# Patient Record
Sex: Female | Born: 1935 | Race: Black or African American | Hispanic: No | Marital: Single | Smoking: Former smoker
Health system: Southern US, Community
[De-identification: ages and names within clinical notes are randomized; demographics above are authoritative.]

## PROBLEM LIST (undated history)

## (undated) DIAGNOSIS — E042 Nontoxic multinodular goiter: Secondary | ICD-10-CM

## (undated) DIAGNOSIS — A159 Respiratory tuberculosis unspecified: Secondary | ICD-10-CM

## (undated) DIAGNOSIS — I10 Essential (primary) hypertension: Secondary | ICD-10-CM

## (undated) DIAGNOSIS — I509 Heart failure, unspecified: Secondary | ICD-10-CM

## (undated) DIAGNOSIS — E785 Hyperlipidemia, unspecified: Secondary | ICD-10-CM

## (undated) DIAGNOSIS — I4891 Unspecified atrial fibrillation: Secondary | ICD-10-CM

## (undated) DIAGNOSIS — R9389 Abnormal findings on diagnostic imaging of other specified body structures: Secondary | ICD-10-CM

## (undated) DIAGNOSIS — I311 Chronic constrictive pericarditis: Secondary | ICD-10-CM

## (undated) DIAGNOSIS — Z90710 Acquired absence of both cervix and uterus: Secondary | ICD-10-CM

## (undated) DIAGNOSIS — E039 Hypothyroidism, unspecified: Secondary | ICD-10-CM

## (undated) DIAGNOSIS — R498 Other voice and resonance disorders: Secondary | ICD-10-CM

## (undated) DIAGNOSIS — R011 Cardiac murmur, unspecified: Secondary | ICD-10-CM

## (undated) HISTORY — DX: Unspecified atrial fibrillation: I48.91

## (undated) HISTORY — PX: OTHER SURGICAL HISTORY: SHX169

## (undated) HISTORY — DX: Respiratory tuberculosis unspecified: A15.9

## (undated) HISTORY — DX: Heart failure, unspecified: I50.9

## (undated) HISTORY — DX: Cardiac murmur, unspecified: R01.1

## (undated) HISTORY — DX: Chronic constrictive pericarditis: I31.1

## (undated) HISTORY — DX: Abnormal findings on diagnostic imaging of other specified body structures: R93.89

## (undated) HISTORY — DX: Hypothyroidism, unspecified: E03.9

## (undated) HISTORY — DX: Essential (primary) hypertension: I10

## (undated) HISTORY — DX: Other voice and resonance disorders: R49.8

## (undated) HISTORY — DX: Nontoxic multinodular goiter: E04.2

## (undated) HISTORY — DX: Acquired absence of both cervix and uterus: Z90.710

## (undated) HISTORY — DX: Hyperlipidemia, unspecified: E78.5

---

## 1990-10-21 DIAGNOSIS — Z90710 Acquired absence of both cervix and uterus: Secondary | ICD-10-CM

## 1990-10-21 HISTORY — PX: ABDOMINAL HYSTERECTOMY: SHX81

## 1990-10-21 HISTORY — DX: Acquired absence of both cervix and uterus: Z90.710

## 2000-10-21 DIAGNOSIS — A159 Respiratory tuberculosis unspecified: Secondary | ICD-10-CM

## 2000-10-21 HISTORY — PX: PERICARDIAL WINDOW: SHX2213

## 2000-10-21 HISTORY — DX: Respiratory tuberculosis unspecified: A15.9

## 2002-12-09 ENCOUNTER — Encounter: Admission: RE | Admit: 2002-12-09 | Discharge: 2002-12-09 | Payer: Self-pay | Admitting: Cardiology

## 2002-12-09 ENCOUNTER — Encounter: Payer: Self-pay | Admitting: Cardiology

## 2003-10-26 ENCOUNTER — Ambulatory Visit (HOSPITAL_COMMUNITY): Admission: RE | Admit: 2003-10-26 | Discharge: 2003-10-26 | Payer: Self-pay | Admitting: Cardiology

## 2006-04-01 ENCOUNTER — Ambulatory Visit: Payer: Self-pay | Admitting: Cardiology

## 2006-04-10 ENCOUNTER — Ambulatory Visit: Payer: Self-pay | Admitting: Cardiology

## 2006-04-15 ENCOUNTER — Ambulatory Visit: Payer: Self-pay | Admitting: Internal Medicine

## 2006-04-17 ENCOUNTER — Ambulatory Visit: Payer: Self-pay

## 2006-04-17 ENCOUNTER — Ambulatory Visit: Payer: Self-pay | Admitting: Internal Medicine

## 2006-04-17 ENCOUNTER — Encounter: Payer: Self-pay | Admitting: Cardiology

## 2006-04-22 ENCOUNTER — Ambulatory Visit: Payer: Self-pay | Admitting: Family Medicine

## 2006-04-25 ENCOUNTER — Ambulatory Visit: Payer: Self-pay | Admitting: Internal Medicine

## 2006-05-01 ENCOUNTER — Ambulatory Visit: Payer: Self-pay

## 2006-05-01 ENCOUNTER — Ambulatory Visit: Payer: Self-pay | Admitting: Cardiology

## 2006-05-12 ENCOUNTER — Ambulatory Visit: Payer: Self-pay | Admitting: Cardiology

## 2006-05-13 ENCOUNTER — Ambulatory Visit: Payer: Self-pay | Admitting: Internal Medicine

## 2006-05-27 ENCOUNTER — Ambulatory Visit: Payer: Self-pay | Admitting: Cardiology

## 2006-06-10 ENCOUNTER — Ambulatory Visit: Payer: Self-pay | Admitting: Cardiology

## 2006-06-10 ENCOUNTER — Ambulatory Visit: Payer: Self-pay | Admitting: Internal Medicine

## 2006-06-24 ENCOUNTER — Ambulatory Visit: Payer: Self-pay | Admitting: Endocrinology

## 2006-06-26 ENCOUNTER — Ambulatory Visit: Payer: Self-pay | Admitting: Cardiology

## 2006-07-14 ENCOUNTER — Ambulatory Visit: Payer: Self-pay | Admitting: Cardiology

## 2006-07-28 ENCOUNTER — Ambulatory Visit: Payer: Self-pay | Admitting: Cardiovascular Disease

## 2006-08-19 ENCOUNTER — Ambulatory Visit: Payer: Self-pay | Admitting: Cardiology

## 2006-08-29 ENCOUNTER — Ambulatory Visit: Payer: Self-pay | Admitting: Cardiology

## 2006-09-09 ENCOUNTER — Ambulatory Visit: Payer: Self-pay | Admitting: Cardiovascular Disease

## 2006-09-23 ENCOUNTER — Ambulatory Visit: Payer: Self-pay | Admitting: Internal Medicine

## 2006-09-23 ENCOUNTER — Ambulatory Visit: Payer: Self-pay | Admitting: Cardiology

## 2006-10-23 ENCOUNTER — Ambulatory Visit: Payer: Self-pay | Admitting: Cardiology

## 2006-10-28 ENCOUNTER — Ambulatory Visit: Payer: Self-pay | Admitting: Endocrinology

## 2006-11-11 ENCOUNTER — Ambulatory Visit: Payer: Self-pay | Admitting: *Deleted

## 2006-11-24 ENCOUNTER — Encounter: Admission: RE | Admit: 2006-11-24 | Discharge: 2006-11-24 | Payer: Self-pay | Admitting: Endocrinology

## 2006-12-09 ENCOUNTER — Ambulatory Visit: Payer: Self-pay | Admitting: Endocrinology

## 2006-12-11 ENCOUNTER — Ambulatory Visit: Payer: Self-pay | Admitting: Cardiology

## 2006-12-22 ENCOUNTER — Encounter: Admission: RE | Admit: 2006-12-22 | Discharge: 2006-12-22 | Payer: Self-pay | Admitting: Endocrinology

## 2007-01-08 ENCOUNTER — Ambulatory Visit: Payer: Self-pay | Admitting: Cardiology

## 2007-01-20 ENCOUNTER — Ambulatory Visit: Payer: Self-pay | Admitting: Endocrinology

## 2007-02-05 ENCOUNTER — Ambulatory Visit: Payer: Self-pay | Admitting: Internal Medicine

## 2007-02-19 ENCOUNTER — Ambulatory Visit: Payer: Self-pay | Admitting: Endocrinology

## 2007-02-19 LAB — CONVERTED CEMR LAB: Free T4: 0.8 ng/dL (ref 0.6–1.6)

## 2007-03-05 ENCOUNTER — Ambulatory Visit: Payer: Self-pay | Admitting: Internal Medicine

## 2007-03-19 ENCOUNTER — Ambulatory Visit: Payer: Self-pay | Admitting: *Deleted

## 2007-03-24 ENCOUNTER — Ambulatory Visit: Payer: Self-pay | Admitting: Cardiology

## 2007-03-26 ENCOUNTER — Ambulatory Visit: Payer: Self-pay | Admitting: Endocrinology

## 2007-03-26 LAB — CONVERTED CEMR LAB: TSH: 21.32 microintl units/mL — ABNORMAL HIGH (ref 0.35–5.50)

## 2007-03-31 ENCOUNTER — Ambulatory Visit: Payer: Self-pay | Admitting: Internal Medicine

## 2007-04-01 ENCOUNTER — Ambulatory Visit: Payer: Self-pay | Admitting: Internal Medicine

## 2007-04-02 ENCOUNTER — Ambulatory Visit: Payer: Self-pay | Admitting: Cardiology

## 2007-04-14 ENCOUNTER — Ambulatory Visit: Payer: Self-pay | Admitting: Cardiology

## 2007-04-28 ENCOUNTER — Ambulatory Visit: Payer: Self-pay | Admitting: Endocrinology

## 2007-05-05 ENCOUNTER — Ambulatory Visit: Payer: Self-pay | Admitting: Cardiovascular Disease

## 2007-06-02 ENCOUNTER — Ambulatory Visit: Payer: Self-pay | Admitting: Cardiology

## 2007-06-02 LAB — CONVERTED CEMR LAB: TSH: 1.61 microintl units/mL (ref 0.35–5.50)

## 2007-06-04 ENCOUNTER — Encounter: Payer: Self-pay | Admitting: Internal Medicine

## 2007-06-23 ENCOUNTER — Ambulatory Visit: Payer: Self-pay | Admitting: Cardiology

## 2007-06-30 ENCOUNTER — Ambulatory Visit: Payer: Self-pay | Admitting: Endocrinology

## 2007-07-14 ENCOUNTER — Ambulatory Visit: Payer: Self-pay | Admitting: Cardiology

## 2007-07-21 ENCOUNTER — Ambulatory Visit: Payer: Self-pay | Admitting: Internal Medicine

## 2007-07-21 ENCOUNTER — Ambulatory Visit: Payer: Self-pay | Admitting: Endocrinology

## 2007-07-21 ENCOUNTER — Ambulatory Visit: Payer: Self-pay | Admitting: Cardiology

## 2007-07-21 LAB — CONVERTED CEMR LAB
ALT: 20 units/L (ref 0–35)
AST: 21 units/L (ref 0–37)
Albumin: 3.4 g/dL — ABNORMAL LOW (ref 3.5–5.2)
Alkaline Phosphatase: 52 units/L (ref 39–117)
Cholesterol: 233 mg/dL (ref 0–200)
Direct LDL: 166.5 mg/dL
Total Bilirubin: 0.6 mg/dL (ref 0.3–1.2)
Total Protein: 7.2 g/dL (ref 6.0–8.3)
Triglycerides: 165 mg/dL — ABNORMAL HIGH (ref 0–149)

## 2007-07-23 ENCOUNTER — Ambulatory Visit: Payer: Self-pay | Admitting: Pulmonary Disease

## 2007-07-28 ENCOUNTER — Ambulatory Visit: Payer: Self-pay | Admitting: Pulmonary Disease

## 2007-07-28 ENCOUNTER — Ambulatory Visit (HOSPITAL_COMMUNITY): Admission: RE | Admit: 2007-07-28 | Discharge: 2007-07-28 | Payer: Self-pay | Admitting: Pulmonary Disease

## 2007-07-30 ENCOUNTER — Ambulatory Visit: Payer: Self-pay | Admitting: Pulmonary Disease

## 2007-08-06 ENCOUNTER — Telehealth: Payer: Self-pay | Admitting: Endocrinology

## 2007-08-18 ENCOUNTER — Ambulatory Visit: Payer: Self-pay | Admitting: Cardiology

## 2007-08-28 ENCOUNTER — Ambulatory Visit: Payer: Self-pay | Admitting: Cardiology

## 2007-09-01 ENCOUNTER — Ambulatory Visit: Payer: Self-pay | Admitting: Cardiovascular Disease

## 2007-09-15 ENCOUNTER — Ambulatory Visit: Payer: Self-pay | Admitting: Internal Medicine

## 2007-10-02 ENCOUNTER — Telehealth: Payer: Self-pay | Admitting: Pulmonary Disease

## 2007-10-06 ENCOUNTER — Ambulatory Visit: Payer: Self-pay | Admitting: Internal Medicine

## 2007-10-07 ENCOUNTER — Telehealth (INDEPENDENT_AMBULATORY_CARE_PROVIDER_SITE_OTHER): Payer: Self-pay | Admitting: *Deleted

## 2007-10-07 DIAGNOSIS — I10 Essential (primary) hypertension: Secondary | ICD-10-CM

## 2007-10-07 DIAGNOSIS — E042 Nontoxic multinodular goiter: Secondary | ICD-10-CM

## 2007-10-07 DIAGNOSIS — I4891 Unspecified atrial fibrillation: Secondary | ICD-10-CM

## 2007-10-07 DIAGNOSIS — I311 Chronic constrictive pericarditis: Secondary | ICD-10-CM | POA: Insufficient documentation

## 2007-10-07 DIAGNOSIS — R498 Other voice and resonance disorders: Secondary | ICD-10-CM

## 2007-11-03 ENCOUNTER — Ambulatory Visit: Payer: Self-pay | Admitting: Cardiovascular Disease

## 2007-11-17 ENCOUNTER — Ambulatory Visit: Payer: Self-pay | Admitting: Cardiovascular Disease

## 2007-11-19 ENCOUNTER — Ambulatory Visit: Payer: Self-pay | Admitting: Cardiology

## 2007-12-02 DIAGNOSIS — R0689 Other abnormalities of breathing: Secondary | ICD-10-CM

## 2007-12-08 ENCOUNTER — Ambulatory Visit: Payer: Self-pay | Admitting: Cardiology

## 2007-12-29 ENCOUNTER — Ambulatory Visit: Payer: Self-pay | Admitting: Endocrinology

## 2007-12-29 DIAGNOSIS — E7800 Pure hypercholesterolemia, unspecified: Secondary | ICD-10-CM | POA: Insufficient documentation

## 2008-01-05 ENCOUNTER — Encounter: Payer: Self-pay | Admitting: Endocrinology

## 2008-01-05 ENCOUNTER — Ambulatory Visit: Payer: Self-pay | Admitting: Cardiology

## 2008-01-06 LAB — CONVERTED CEMR LAB
Cholesterol: 216 mg/dL (ref 0–200)
Direct LDL: 152 mg/dL
Triglycerides: 129 mg/dL (ref 0–149)

## 2008-01-12 ENCOUNTER — Telehealth: Payer: Self-pay | Admitting: Endocrinology

## 2008-02-02 ENCOUNTER — Ambulatory Visit: Payer: Self-pay | Admitting: Cardiology

## 2008-03-08 ENCOUNTER — Ambulatory Visit: Payer: Self-pay | Admitting: Cardiology

## 2008-04-05 ENCOUNTER — Ambulatory Visit: Payer: Self-pay | Admitting: Cardiology

## 2008-05-03 ENCOUNTER — Ambulatory Visit: Payer: Self-pay | Admitting: Internal Medicine

## 2008-05-05 ENCOUNTER — Encounter: Payer: Self-pay | Admitting: Internal Medicine

## 2008-05-31 ENCOUNTER — Ambulatory Visit: Payer: Self-pay | Admitting: Cardiovascular Disease

## 2008-06-10 ENCOUNTER — Encounter: Payer: Self-pay | Admitting: Internal Medicine

## 2008-06-10 ENCOUNTER — Ambulatory Visit: Payer: Self-pay | Admitting: Internal Medicine

## 2008-06-18 ENCOUNTER — Encounter: Payer: Self-pay | Admitting: Internal Medicine

## 2008-06-18 DIAGNOSIS — M949 Disorder of cartilage, unspecified: Secondary | ICD-10-CM

## 2008-06-18 DIAGNOSIS — M899 Disorder of bone, unspecified: Secondary | ICD-10-CM | POA: Insufficient documentation

## 2008-06-21 ENCOUNTER — Ambulatory Visit: Payer: Self-pay | Admitting: Cardiology

## 2008-06-21 ENCOUNTER — Telehealth (INDEPENDENT_AMBULATORY_CARE_PROVIDER_SITE_OTHER): Payer: Self-pay | Admitting: *Deleted

## 2008-07-04 ENCOUNTER — Ambulatory Visit: Payer: Self-pay | Admitting: Endocrinology

## 2008-07-04 DIAGNOSIS — E890 Postprocedural hypothyroidism: Secondary | ICD-10-CM | POA: Insufficient documentation

## 2008-07-12 ENCOUNTER — Encounter: Admission: RE | Admit: 2008-07-12 | Discharge: 2008-07-12 | Payer: Self-pay | Admitting: Endocrinology

## 2008-07-15 ENCOUNTER — Ambulatory Visit: Payer: Self-pay | Admitting: Internal Medicine

## 2008-07-15 ENCOUNTER — Ambulatory Visit: Payer: Self-pay | Admitting: Endocrinology

## 2008-07-15 LAB — CONVERTED CEMR LAB
AST: 26 units/L (ref 0–37)
Albumin: 3.4 g/dL — ABNORMAL LOW (ref 3.5–5.2)
Total Bilirubin: 0.7 mg/dL (ref 0.3–1.2)
Total Protein: 7.3 g/dL (ref 6.0–8.3)

## 2008-07-27 ENCOUNTER — Telehealth (INDEPENDENT_AMBULATORY_CARE_PROVIDER_SITE_OTHER): Payer: Self-pay | Admitting: *Deleted

## 2008-07-28 ENCOUNTER — Telehealth (INDEPENDENT_AMBULATORY_CARE_PROVIDER_SITE_OTHER): Payer: Self-pay | Admitting: *Deleted

## 2008-08-09 ENCOUNTER — Telehealth: Payer: Self-pay | Admitting: Endocrinology

## 2008-08-09 ENCOUNTER — Ambulatory Visit: Payer: Self-pay | Admitting: Internal Medicine

## 2008-08-23 ENCOUNTER — Ambulatory Visit: Payer: Self-pay | Admitting: Endocrinology

## 2008-08-23 ENCOUNTER — Ambulatory Visit: Payer: Self-pay | Admitting: Internal Medicine

## 2008-08-23 LAB — CONVERTED CEMR LAB
Direct LDL: 143.5 mg/dL
Total CHOL/HDL Ratio: 6.8
VLDL: 21 mg/dL (ref 0–40)

## 2008-09-23 ENCOUNTER — Ambulatory Visit: Payer: Self-pay | Admitting: Internal Medicine

## 2008-10-20 ENCOUNTER — Ambulatory Visit: Payer: Self-pay | Admitting: Internal Medicine

## 2008-11-08 ENCOUNTER — Ambulatory Visit: Payer: Self-pay | Admitting: Cardiology

## 2008-11-17 ENCOUNTER — Ambulatory Visit: Payer: Self-pay | Admitting: Internal Medicine

## 2008-11-29 ENCOUNTER — Ambulatory Visit: Payer: Self-pay | Admitting: Cardiology

## 2008-12-07 ENCOUNTER — Ambulatory Visit: Payer: Self-pay | Admitting: Pulmonary Disease

## 2008-12-08 ENCOUNTER — Telehealth (INDEPENDENT_AMBULATORY_CARE_PROVIDER_SITE_OTHER): Payer: Self-pay | Admitting: *Deleted

## 2008-12-13 ENCOUNTER — Ambulatory Visit: Payer: Self-pay | Admitting: Cardiovascular Disease

## 2009-01-10 ENCOUNTER — Ambulatory Visit: Payer: Self-pay | Admitting: Cardiovascular Disease

## 2009-02-03 ENCOUNTER — Telehealth: Payer: Self-pay | Admitting: Endocrinology

## 2009-02-06 ENCOUNTER — Telehealth: Payer: Self-pay | Admitting: Internal Medicine

## 2009-02-07 ENCOUNTER — Encounter: Payer: Self-pay | Admitting: Internal Medicine

## 2009-02-08 ENCOUNTER — Encounter: Payer: Self-pay | Admitting: Internal Medicine

## 2009-02-10 ENCOUNTER — Ambulatory Visit: Payer: Self-pay | Admitting: Cardiology

## 2009-02-13 ENCOUNTER — Telehealth (INDEPENDENT_AMBULATORY_CARE_PROVIDER_SITE_OTHER): Payer: Self-pay | Admitting: Radiology

## 2009-02-14 ENCOUNTER — Ambulatory Visit: Payer: Self-pay

## 2009-02-14 ENCOUNTER — Encounter: Payer: Self-pay | Admitting: Internal Medicine

## 2009-02-14 ENCOUNTER — Ambulatory Visit: Payer: Self-pay | Admitting: Internal Medicine

## 2009-02-21 LAB — CONVERTED CEMR LAB
TSH: 4.18 microintl units/mL (ref 0.35–5.50)
Triglycerides: 176 mg/dL — ABNORMAL HIGH (ref 0.0–149.0)

## 2009-02-28 ENCOUNTER — Telehealth (INDEPENDENT_AMBULATORY_CARE_PROVIDER_SITE_OTHER): Payer: Self-pay | Admitting: *Deleted

## 2009-03-07 ENCOUNTER — Ambulatory Visit: Payer: Self-pay | Admitting: Internal Medicine

## 2009-03-13 ENCOUNTER — Encounter: Payer: Self-pay | Admitting: Internal Medicine

## 2009-03-21 ENCOUNTER — Ambulatory Visit: Payer: Self-pay | Admitting: Cardiology

## 2009-03-21 ENCOUNTER — Encounter: Payer: Self-pay | Admitting: *Deleted

## 2009-04-11 ENCOUNTER — Ambulatory Visit: Payer: Self-pay | Admitting: Cardiology

## 2009-04-20 ENCOUNTER — Telehealth: Payer: Self-pay | Admitting: Internal Medicine

## 2009-04-25 ENCOUNTER — Ambulatory Visit: Payer: Self-pay | Admitting: Cardiology

## 2009-04-25 ENCOUNTER — Encounter: Payer: Self-pay | Admitting: Cardiology

## 2009-04-25 ENCOUNTER — Encounter: Payer: Self-pay | Admitting: Internal Medicine

## 2009-04-25 LAB — CONVERTED CEMR LAB
POC INR: 1.6
Prothrombin Time: 15.4 s

## 2009-04-26 ENCOUNTER — Encounter: Payer: Self-pay | Admitting: *Deleted

## 2009-05-05 ENCOUNTER — Telehealth: Payer: Self-pay | Admitting: Internal Medicine

## 2009-05-09 ENCOUNTER — Ambulatory Visit: Payer: Self-pay | Admitting: Internal Medicine

## 2009-05-09 ENCOUNTER — Ambulatory Visit: Payer: Self-pay | Admitting: Cardiology

## 2009-05-09 DIAGNOSIS — H409 Unspecified glaucoma: Secondary | ICD-10-CM | POA: Insufficient documentation

## 2009-05-09 DIAGNOSIS — R609 Edema, unspecified: Secondary | ICD-10-CM

## 2009-05-09 DIAGNOSIS — E785 Hyperlipidemia, unspecified: Secondary | ICD-10-CM | POA: Insufficient documentation

## 2009-05-23 ENCOUNTER — Ambulatory Visit: Payer: Self-pay | Admitting: Cardiology

## 2009-05-23 LAB — CONVERTED CEMR LAB
POC INR: 2.1
Prothrombin Time: 17.9 s

## 2009-05-24 LAB — CONVERTED CEMR LAB
ALT: 18 units/L (ref 0–35)
AST: 22 units/L (ref 0–37)

## 2009-05-26 LAB — CONVERTED CEMR LAB
LDL Cholesterol: 130 mg/dL — ABNORMAL HIGH (ref 0–99)
VLDL: 18.2 mg/dL (ref 0.0–40.0)

## 2009-06-13 ENCOUNTER — Ambulatory Visit: Payer: Self-pay | Admitting: Internal Medicine

## 2009-06-29 ENCOUNTER — Ambulatory Visit: Payer: Self-pay | Admitting: Internal Medicine

## 2009-07-11 ENCOUNTER — Ambulatory Visit: Payer: Self-pay | Admitting: Cardiology

## 2009-07-11 LAB — CONVERTED CEMR LAB: POC INR: 3.6

## 2009-08-01 ENCOUNTER — Ambulatory Visit: Payer: Self-pay | Admitting: Cardiology

## 2009-08-01 LAB — CONVERTED CEMR LAB: POC INR: 2.8

## 2009-08-29 ENCOUNTER — Ambulatory Visit: Payer: Self-pay | Admitting: Endocrinology

## 2009-08-29 ENCOUNTER — Ambulatory Visit: Payer: Self-pay | Admitting: Cardiology

## 2009-08-29 LAB — CONVERTED CEMR LAB: POC INR: 1.9

## 2009-08-30 LAB — CONVERTED CEMR LAB: TSH: 3.11 microintl units/mL (ref 0.35–5.50)

## 2009-09-26 ENCOUNTER — Ambulatory Visit: Payer: Self-pay | Admitting: Cardiovascular Disease

## 2009-09-26 LAB — CONVERTED CEMR LAB: POC INR: 3

## 2009-10-24 ENCOUNTER — Ambulatory Visit: Payer: Self-pay | Admitting: Cardiology

## 2009-11-21 ENCOUNTER — Ambulatory Visit: Payer: Self-pay | Admitting: Internal Medicine

## 2009-11-21 ENCOUNTER — Encounter (INDEPENDENT_AMBULATORY_CARE_PROVIDER_SITE_OTHER): Payer: Self-pay | Admitting: Cardiology

## 2009-11-21 LAB — CONVERTED CEMR LAB: POC INR: 1.9

## 2009-12-19 ENCOUNTER — Ambulatory Visit: Payer: Self-pay | Admitting: Cardiovascular Disease

## 2009-12-19 LAB — CONVERTED CEMR LAB: POC INR: 2.2

## 2010-01-01 ENCOUNTER — Ambulatory Visit: Payer: Self-pay | Admitting: Internal Medicine

## 2010-01-30 ENCOUNTER — Ambulatory Visit: Payer: Self-pay | Admitting: Cardiovascular Disease

## 2010-01-30 LAB — CONVERTED CEMR LAB: POC INR: 2.4

## 2010-02-16 ENCOUNTER — Ambulatory Visit: Payer: Self-pay | Admitting: Internal Medicine

## 2010-02-27 ENCOUNTER — Telehealth: Payer: Self-pay | Admitting: Internal Medicine

## 2010-03-02 ENCOUNTER — Ambulatory Visit: Payer: Self-pay | Admitting: Cardiology

## 2010-04-03 ENCOUNTER — Ambulatory Visit: Payer: Self-pay | Admitting: Cardiovascular Disease

## 2010-04-03 LAB — CONVERTED CEMR LAB: POC INR: 2.5

## 2010-05-01 ENCOUNTER — Ambulatory Visit: Payer: Self-pay | Admitting: Cardiovascular Disease

## 2010-05-29 ENCOUNTER — Ambulatory Visit: Payer: Self-pay | Admitting: Cardiology

## 2010-05-29 LAB — CONVERTED CEMR LAB: POC INR: 2

## 2010-06-08 ENCOUNTER — Telehealth: Payer: Self-pay | Admitting: Internal Medicine

## 2010-06-20 ENCOUNTER — Ambulatory Visit: Payer: Self-pay | Admitting: Internal Medicine

## 2010-06-20 DIAGNOSIS — R05 Cough: Secondary | ICD-10-CM

## 2010-06-20 DIAGNOSIS — R5383 Other fatigue: Secondary | ICD-10-CM

## 2010-06-20 DIAGNOSIS — J019 Acute sinusitis, unspecified: Secondary | ICD-10-CM | POA: Insufficient documentation

## 2010-06-20 DIAGNOSIS — R5381 Other malaise: Secondary | ICD-10-CM

## 2010-06-26 ENCOUNTER — Telehealth (INDEPENDENT_AMBULATORY_CARE_PROVIDER_SITE_OTHER): Payer: Self-pay | Admitting: *Deleted

## 2010-07-03 ENCOUNTER — Ambulatory Visit: Payer: Self-pay | Admitting: Cardiovascular Disease

## 2010-07-03 ENCOUNTER — Ambulatory Visit: Payer: Self-pay | Admitting: Internal Medicine

## 2010-07-04 LAB — CONVERTED CEMR LAB
AST: 21 units/L (ref 0–37)
Alkaline Phosphatase: 39 units/L (ref 39–117)
BUN: 8 mg/dL (ref 6–23)
Basophils Absolute: 0 10*3/uL (ref 0.0–0.1)
Bilirubin, Direct: 0.2 mg/dL (ref 0.0–0.3)
Calcium: 9 mg/dL (ref 8.4–10.5)
Direct LDL: 146.4 mg/dL
Eosinophils Absolute: 0.2 10*3/uL (ref 0.0–0.7)
Eosinophils Relative: 2.3 % (ref 0.0–5.0)
Folate: >20 ng/mL
HDL: 33 mg/dL — ABNORMAL LOW (ref >39.00)
Hemoglobin: 13.1 g/dL (ref 12.0–15.0)
Lymphocytes Relative: 26 % (ref 12.0–46.0)
MCHC: 33.9 g/dL (ref 30.0–36.0)
Monocytes Absolute: 0.4 10*3/uL (ref 0.1–1.0)
Monocytes Relative: 5.4 % (ref 3.0–12.0)
Neutro Abs: 5.3 10*3/uL (ref 1.4–7.7)
Platelets: 221 10*3/uL (ref 150.0–400.0)
Potassium: 3.8 meq/L (ref 3.5–5.1)
RBC: 3.88 M/uL (ref 3.87–5.11)
RDW: 14.2 % (ref 11.5–14.6)
Sed Rate: 39 mm/hr — ABNORMAL HIGH (ref 0–22)
Total Bilirubin: 0.8 mg/dL (ref 0.3–1.2)
Total Protein: 6.9 g/dL (ref 6.0–8.3)
VLDL: 23.6 mg/dL (ref 0.0–40.0)
WBC: 8 10*3/uL (ref 4.5–10.5)

## 2010-07-31 ENCOUNTER — Ambulatory Visit: Payer: Self-pay | Admitting: Cardiology

## 2010-08-22 ENCOUNTER — Encounter (INDEPENDENT_AMBULATORY_CARE_PROVIDER_SITE_OTHER): Payer: Self-pay | Admitting: *Deleted

## 2010-08-28 ENCOUNTER — Ambulatory Visit: Payer: Self-pay | Admitting: Internal Medicine

## 2010-09-04 ENCOUNTER — Ambulatory Visit: Payer: Self-pay | Admitting: Internal Medicine

## 2010-09-04 DIAGNOSIS — R062 Wheezing: Secondary | ICD-10-CM | POA: Insufficient documentation

## 2010-09-25 ENCOUNTER — Ambulatory Visit: Payer: Self-pay | Admitting: Cardiovascular Disease

## 2010-09-25 LAB — CONVERTED CEMR LAB: POC INR: 3

## 2010-10-01 ENCOUNTER — Ambulatory Visit: Payer: Self-pay | Admitting: Gastroenterology

## 2010-10-01 ENCOUNTER — Encounter: Payer: Self-pay | Admitting: Internal Medicine

## 2010-10-01 DIAGNOSIS — D689 Coagulation defect, unspecified: Secondary | ICD-10-CM

## 2010-10-08 ENCOUNTER — Encounter: Payer: Self-pay | Admitting: Gastroenterology

## 2010-10-23 ENCOUNTER — Ambulatory Visit: Admission: RE | Admit: 2010-10-23 | Discharge: 2010-10-23 | Payer: Self-pay | Source: Home / Self Care

## 2010-10-30 ENCOUNTER — Telehealth: Payer: Self-pay | Admitting: Internal Medicine

## 2010-10-30 ENCOUNTER — Ambulatory Visit
Admission: RE | Admit: 2010-10-30 | Discharge: 2010-10-30 | Payer: Self-pay | Source: Home / Self Care | Attending: Internal Medicine | Admitting: Internal Medicine

## 2010-10-30 ENCOUNTER — Encounter: Payer: Self-pay | Admitting: Internal Medicine

## 2010-11-06 ENCOUNTER — Encounter: Payer: Self-pay | Admitting: Gastroenterology

## 2010-11-06 ENCOUNTER — Ambulatory Visit
Admission: RE | Admit: 2010-11-06 | Discharge: 2010-11-06 | Payer: Self-pay | Source: Home / Self Care | Attending: Gastroenterology | Admitting: Gastroenterology

## 2010-11-06 LAB — HM COLONOSCOPY

## 2010-11-09 ENCOUNTER — Telehealth: Payer: Self-pay | Admitting: Gastroenterology

## 2010-11-11 ENCOUNTER — Encounter: Payer: Self-pay | Admitting: Cardiology

## 2010-11-11 ENCOUNTER — Encounter: Payer: Self-pay | Admitting: Internal Medicine

## 2010-11-13 ENCOUNTER — Ambulatory Visit: Admission: RE | Admit: 2010-11-13 | Discharge: 2010-11-13 | Payer: Self-pay | Source: Home / Self Care

## 2010-11-14 ENCOUNTER — Ambulatory Visit
Admission: RE | Admit: 2010-11-14 | Discharge: 2010-11-14 | Payer: Self-pay | Source: Home / Self Care | Attending: Internal Medicine | Admitting: Internal Medicine

## 2010-11-14 DIAGNOSIS — R079 Chest pain, unspecified: Secondary | ICD-10-CM | POA: Insufficient documentation

## 2010-11-18 LAB — CONVERTED CEMR LAB
CO2: 31 meq/L (ref 19–32)
Calcium: 9.1 mg/dL (ref 8.4–10.5)
Chloride: 102 meq/L (ref 96–112)
Cholesterol: 208 mg/dL — ABNORMAL HIGH (ref 0–200)
Triglycerides: 157 mg/dL — ABNORMAL HIGH (ref 0.0–149.0)

## 2010-11-20 NOTE — Medication Information (Signed)
Summary: rov/ewj  Anticoagulant Therapy  Managed by: Bethena Midget, RN, BSN Referring MD: Nona Dell PCP: Corwin Levins MD Supervising MD: Clifton James MD, Cristal Deer Indication 1: Atrial Fibrillation (ICD-427.31) Lab Used: [facility] Site: [facility] INR POC 2.2 INR RANGE 2 - 3  Dietary changes: no    Health status changes: no    Bleeding/hemorrhagic complications: no    Recent/future hospitalizations: no    Any changes in medication regimen? no    Recent/future dental: no  Any missed doses?: yes     Details: Possible missed last Thursday  Is patient compliant with meds? yes       Allergies: No Known Drug Allergies  Anticoagulation Management History:      The patient is taking warfarin and comes in today for a routine follow up visit.  Positive risk factors for bleeding include an age of 74 years or older.  The bleeding index is 'intermediate risk'.  Positive CHADS2 values include History of HTN.  Negative CHADS2 values include Age > 74 years old.  The start date was 04/07/2006.  Anticoagulation responsible provider: Clifton James MD, Cristal Deer.  INR POC: 2.2.  Cuvette Lot#: [id number].  Exp: 01/2011.    Anticoagulation Management Assessment/Plan:      The patient's current anticoagulation dose is Warfarin sodium 5 mg tabs: Use as directed by Anticoagulation Clinic.  The target INR is 2.0-3.0.  The next INR is due 01/16/2010.  Anticoagulation instructions were given to patient.  Results were reviewed/authorized by Bethena Midget, RN, BSN.  She was notified by Bethena Midget, RN, BSN.         Prior Anticoagulation Instructions: INR 1.9  Take 1.5 tabs today then resume 1 tab daily except 0.5 tab on Mondays, Wednesdays, Fridays.  Recheck in 3 - 4 weeks.   Current Anticoagulation Instructions: INR 2.2 Continue 5mg s daily except 2.5mg s on Mondays, Wednesdays and Fridays. Recheck in 4 weeks.

## 2010-11-20 NOTE — Medication Information (Signed)
Summary: rov/sp  Anticoagulant Therapy  Managed by: Bethena Midget, RN, BSN Referring MD: Nona Dell PCP: Corwin Levins MD Supervising MD: Eden Emms MD, Theron Arista Indication 1: Atrial Fibrillation (ICD-427.31) Lab Used: [facility] Site: [facility] INR POC 3.0 INR RANGE 2 - 3  Dietary changes: no    Health status changes: no    Bleeding/hemorrhagic complications: no    Recent/future hospitalizations: no    Any changes in medication regimen? no    Recent/future dental: no  Any missed doses?: yes     Details: 09/05/10 missed a dose  Is patient compliant with meds? yes       Allergies: No Known Drug Allergies  Anticoagulation Management History:      The patient is taking warfarin and comes in today for a routine follow up visit.  Positive risk factors for bleeding include an age of 74 years or older.  The bleeding index is 'intermediate risk'.  Positive CHADS2 values include History of HTN.  Negative CHADS2 values include Age > 33 years old.  The start date was 04/07/2006.  Anticoagulation responsible provider: Eden Emms MD, Theron Arista.  INR POC: 3.0.  Cuvette Lot#: [id number].  Exp: 07/2011.    Anticoagulation Management Assessment/Plan:      The patient's current anticoagulation dose is Warfarin sodium 5 mg tabs: Use as directed by Anticoagulation Clinic.  The target INR is 2.0-3.0.  The next INR is due 10/23/2010.  Anticoagulation instructions were given to patient.  Results were reviewed/authorized by Bethena Midget, RN, BSN.  She was notified by Bethena Midget, RN, BSN.         Prior Anticoagulation Instructions: INR 2.8  Continue same dose of 1 tablet every day except 1/2 tablet on Monday, Wednesday and Friday.  Recheck INR in 4 weeks.   Current Anticoagulation Instructions: INR 3.0 Today take 1/2 pill then resume 1 pill everyday except 1/2 pill on Mondays, Wednesdays and Fridays. Recheck in 4 weeks.

## 2010-11-20 NOTE — Letter (Signed)
Summary: Handout Printed  Printed Handout:  - Coumadin Instructions-w/out Meds 

## 2010-11-20 NOTE — Progress Notes (Signed)
Summary: question re rx sent to [facility]   Phone Note Call from Patient   Caller: Patient [phone] Reason for Call: Talk to Nurse Summary of Call: pt wants to talk to nurse about why it took so long for her rx for metroprolol to reach [facility] -pls call [phone] Initial call taken by: Glynda Jaeger,  June 08, 2010 10:12 AM  Follow-up for Phone Call        [facility] that her rx was sent to [facility] on 05/31/10.  She can call us back with any questions Dennis Bast, RN, BSN  June 08, 2010 11:38 AM

## 2010-11-20 NOTE — Letter (Signed)
Summary: New Patient letter  [facility]  [address] [place], [place] [address]   Phone: [phone]  Fax: [phone]       08/22/2010 MRN: [id number]  [facility] [address] [place], [place]  [address]  Dear [name],  Welcome to the Gastroenterology Division at [facility].    You are scheduled to see Dr.  Arlyce Dice on 10-01-10 at 1:30p.m. on the 3rd floor at [facility], [address].  We ask that you try to arrive at our office 15 minutes prior to your appointment time to allow for check-in.  We would like you to complete the enclosed self-administered evaluation form prior to your visit and bring it with you on the day of your appointment.  We will review it with you.  Also, please bring a complete list of all your medications or, if you prefer, bring the medication bottles and we will list them.  Please bring your insurance card so that we may make a copy of it.  If your insurance requires a referral to see a specialist, please bring your referral form from your primary care physician.  Co-payments are due at the time of your visit and may be paid by cash, check or credit card.     Your office visit will consist of a consult with your physician (includes a physical exam), any laboratory testing he/she may order, scheduling of any necessary diagnostic testing (e.g. x-ray, ultrasound, CT-scan), and scheduling of a procedure (e.g. Endoscopy, Colonoscopy) if required.  Please allow enough time on your schedule to allow for any/all of these possibilities.    If you cannot keep your appointment, please call [phone] to cancel or reschedule prior to your appointment date.  This allows [place] the opportunity to schedule an appointment for another patient in need of care.  If you do not cancel or reschedule by 5 p.m. the business day prior to your appointment date, you will be charged a $50.00 late cancellation/no-show fee.    Thank you for  choosing [facility] for your medical needs.  We appreciate the opportunity to care for you.  Please visit [place] at our website  to learn more about our practice.                     Sincerely,                                                             The Gastroenterology Division

## 2010-11-20 NOTE — Medication Information (Signed)
Summary: rov/tm  Anticoagulant Therapy  Managed by: Cloyde Reams, RN, BSN Referring MD: Nona Dell PCP: Corwin Levins MD Supervising MD: Clifton James MD, Cristal Deer Indication 1: Atrial Fibrillation (ICD-427.31) Lab Used: [facility] Site: [facility] INR POC 2.6 INR RANGE 2 - 3  Dietary changes: no    Health status changes: no    Bleeding/hemorrhagic complications: no    Recent/future hospitalizations: no    Any changes in medication regimen? yes       Details: Rx cephalexin 500mg  for ear infection, completed 07/01/10.  Continues on Hydrodone cough syrup prn.   Recent/future dental: no  Any missed doses?: no       Is patient compliant with meds? yes       Allergies: No Known Drug Allergies  Anticoagulation Management History:      The patient is taking warfarin and comes in today for a routine follow up visit.  Positive risk factors for bleeding include an age of 74 years or older.  The bleeding index is 'intermediate risk'.  Positive CHADS2 values include History of HTN.  Negative CHADS2 values include Age > 29 years old.  The start date was 04/07/2006.  Anticoagulation responsible provider: Clifton James MD, Cristal Deer.  INR POC: 2.6.  Cuvette Lot#: [id number].  Exp: 08/2011.    Anticoagulation Management Assessment/Plan:      The patient's current anticoagulation dose is Warfarin sodium 5 mg tabs: Use as directed by Anticoagulation Clinic.  The target INR is 2.0-3.0.  The next INR is due 07/31/2010.  Anticoagulation instructions were given to patient.  Results were reviewed/authorized by Cloyde Reams, RN, BSN.  She was notified by Cloyde Reams RN.         Prior Anticoagulation Instructions: INR 2.0 Continue 5mg s everyday except 2.5mg s on Mondays, Wednesdays and Fridays. Recheck in 4 weeks.   Current Anticoagulation Instructions: INR 2.6  Continue on same dosage 1 tablet daily except 1/2 tablet on Mondays, Wednesdays, and Fridays.  Recheck in 4 weeks.

## 2010-11-20 NOTE — Assessment & Plan Note (Signed)
Summary: rov twing in breast    Referring Provider:  Romero Belling, [name] Primary Provider:  Corwin Levins MD  CC:  pt states she was having pains in her left breast she states it has gone away.  History of Present Illness:  Patient is a 76 year old with a history of PAF and constricve pericarditis.  I last saw her in September of last year.   Since seen she has done well.  She denies significant chest pains.  No SOB.  No palpitations.  Current Medications (verified): 1)  Adult Aspirin Low Strength 81 Mg  Tbdp (Aspirin) .... Take 1 By Mouth Qd 2)  Levothyroxine Sodium 75 Mcg  Tabs (Levothyroxine Sodium) .... Take 1 By Mouth Qd 3)  Vitamin D .... Take 1 Tablet By Mouth Once A Day 4)  Micardis Hct 80-25 Mg Tabs (Telmisartan-Hctz) .Marland Kitchen.. 1 By Mouth Once Daily 5)  Fish Oil 1000 Mg Caps (Omega-3 Fatty Acids) .... Take 1 Capsule By Mouth Daily 6)  Warfarin Sodium 5 Mg Tabs (Warfarin Sodium) .... Use As Directed By Anticoagulation Clinic 7)  Metoprolol Succinate 50 Mg Xr24h-Tab (Metoprolol Succinate) .... Take One Tablet By Mouth Daily  Allergies: No Known Drug Allergies  Past History:  Past medical, surgical, family and social histories (including risk factors) reviewed, and no changes noted (except as noted below).  Past Medical History: Reviewed history from 05/09/2009 and no changes required. Heart Murmur Positive Tb (2002) Glaucoma Current Problems:  ABNORMAL CHEST XRAY (ICD-793.1) HOARSENESS (ICD-784.49) HYPERTENSION (ICD-401.9) HYPOTHYROIDISM (ICD-244.9) GOITER, MULTINODULAR (ICD-241.1) PERICARDITIS, CONSTRICTIVE (ICD-423.2) ATRIAL FIBRILLATION, PAROXYSMAL (ICD-427.31) Hyperlipidemia Hypothyroidism Hypertension glaucoma  Past Surgical History: Hysterectomy (1992) Pericardial window (2002) s/p radioactive iodine for hyperthyroidism/multinodular goiter  Family History: Reviewed history from 05/09/2009 and no changes required. brother with asthma mother and  father with arthritis sister with cervical cancer  Social History: Reviewed history from 05/09/2009 and no changes required. Single no children Former Smoker retired - office work Alcohol use-no  Review of Systems       All systems revewied.  neg to above problem except as noted above.  Vital Signs:  Patient profile:   76 year old female Height:      67 inches Weight:      218 pounds BMI:     34.27 Pulse rate:   59 / minute Resp:     14 per minute BP sitting:   122 / 65  (left arm)  Vitals Entered By: Kem Parkinson (February 16, 2010 2:02 PM)  Physical Exam  Additional Exam:  Patient is in NAD HEENT:  Normocephalic, atraumatic. EOMI, PERRLA.  Neck: JVP is normal. No thyromegaly. No bruits.  Lungs: clear to auscultation. No rales no wheezes.  Heart: Regular rate and rhythm. Normal S1, S2. No S3.   No significant murmurs. PMI not displaced.  Abdomen:  Supple, nontender. Normal bowel sounds. No masses. No hepatomegaly.  Extremities:   Good distal pulses throughout. No lower extremity edema.  Musculoskeletal :moving all extremities.  Neuro:   alert and oriented x3.    EKG  Procedure date:  02/16/2010  Findings:      sinus bradycardia.  59 bpm.  Nospecific T wave changes.  Impression & Recommendations:  Problem # 1:  HYPERLIPIDEMIA (ICD-272.4)  Orders: TLB-Lipid Panel (80061-LIPID)  Problem # 2:  HYPERTENSION (ICD-401.9)  Good control on meds.  Check BMET.  Her updated medication list for this problem includes:    Adult Aspirin Low Strength 81 Mg Tbdp (Aspirin) .Marland KitchenMarland KitchenMarland KitchenMarland Kitchen  Take 1 by mouth qd    Micardis Hct 80-25 Mg Tabs (Telmisartan-hctz) .Marland Kitchen... 1 by mouth once daily    Metoprolol Succinate 50 Mg Xr24h-tab (Metoprolol succinate) .Marland Kitchen... Take one tablet by mouth daily  Problem # 3:  PERICARDITIS, CONSTRICTIVE (ICD-423.2)  s/p window.  Exam unremarkable.  LAst echo in 2007 showed trivial effusion.  Her updated medication list for this problem includes:     Micardis Hct 80-25 Mg Tabs (Telmisartan-hctz) .Marland Kitchen... 1 by mouth once daily  Problem # 4:  ATRIAL FIBRILLATION, PAROXYSMAL (ICD-427.31)  Her updated medication list for this problem includes:    Adult Aspirin Low Strength 81 Mg Tbdp (Aspirin) .Marland Kitchen... Take 1 by mouth qd    Warfarin Sodium 5 Mg Tabs (Warfarin sodium) ..... Use as directed by anticoagulation clinic    Metoprolol Succinate 50 Mg Xr24h-tab (Metoprolol succinate) .Marland Kitchen... Take one tablet by mouth daily  Her updated medication list for this problem includes:    Adult Aspirin Low Strength 81 Mg Tbdp (Aspirin) .Marland Kitchen... Take 1 by mouth qd    Warfarin Sodium 5 Mg Tabs (Warfarin sodium) ..... Use as directed by anticoagulation clinic    Metoprolol Succinate 50 Mg Xr24h-tab (Metoprolol succinate) .Marland Kitchen... Take one tablet by mouth daily  Orders: TLB-BMP (Basic Metabolic Panel-BMET) (80048-METABOL) EKG w/ Interpretation (93000)  Patient Instructions: 1)  Your physician recommends that you return for lab work in: lab work today   we will call you with results,Your physician wants you to follow-up in: 12 months  You will receive a reminder letter in the mail two months in advance. If you don't receive a letter, please call our office to schedule the follow-up appointment.

## 2010-11-20 NOTE — Medication Information (Signed)
Summary: rov/kb  Anticoagulant Therapy  Managed by: Bethena Midget, RN, BSN Referring MD: Nona Dell PCP: Corwin Levins MD Supervising MD: Daleen Squibb MD, Maisie Fus Indication 1: Atrial Fibrillation (ICD-427.31) Lab Used: [facility] Site: [facility] INR POC 2.0 INR RANGE 2 - 3  Dietary changes: no    Health status changes: no    Bleeding/hemorrhagic complications: no    Recent/future hospitalizations: no    Any changes in medication regimen? no    Recent/future dental: no  Any missed doses?: yes     Details: Possible missed last Wednesday.   Is patient compliant with meds? yes       Allergies: No Known Drug Allergies  Anticoagulation Management History:      The patient is taking warfarin and comes in today for a routine follow up visit.  Positive risk factors for bleeding include an age of 74 years or older.  The bleeding index is 'intermediate risk'.  Positive CHADS2 values include History of HTN.  Negative CHADS2 values include Age > 42 years old.  The start date was 04/07/2006.  Anticoagulation responsible provider: Daleen Squibb MD, Maisie Fus.  INR POC: 2.0.  Cuvette Lot#: [id number].  Exp: 07/2011.    Anticoagulation Management Assessment/Plan:      The patient's current anticoagulation dose is Warfarin sodium 5 mg tabs: Use as directed by Anticoagulation Clinic.  The target INR is 2.0-3.0.  The next INR is due 06/26/2010.  Anticoagulation instructions were given to patient.  Results were reviewed/authorized by Bethena Midget, RN, BSN.  She was notified by Bethena Midget, RN, BSN.         Prior Anticoagulation Instructions: INR, 2.4 Resume normal dosing schedule. Take half a tablet on Monday, Wednesday , Friday and take one tablet on all other days. Return in 4 weeks.   Current Anticoagulation Instructions: INR 2.0 Continue 5mg s everyday except 2.5mg s on Mondays, Wednesdays and Fridays. Recheck in 4 weeks.

## 2010-11-20 NOTE — Medication Information (Signed)
Summary: rov/ewj  Anticoagulant Therapy  Managed by: Cloyde Reams, RN, BSN Referring MD: Nona Dell PCP: Corwin Levins MD Supervising MD: Juanda Chance MD, Kristine Tiley Indication 1: Atrial Fibrillation (ICD-427.31) Lab Used: [facility] Site: [facility] INR POC 2.9 INR RANGE 2 - 3  Dietary changes: no    Health status changes: no    Bleeding/hemorrhagic complications: no    Recent/future hospitalizations: no    Any changes in medication regimen? no    Recent/future dental: no  Any missed doses?: yes     Details: Took and extra 1/2 tablet yesterday.    Is patient compliant with meds? yes       Allergies (verified): No Known Drug Allergies  Anticoagulation Management History:      The patient is taking warfarin and comes in today for a routine follow up visit.  Positive risk factors for bleeding include an age of 74 years or older.  The bleeding index is 'intermediate risk'.  Positive CHADS2 values include History of HTN.  Negative CHADS2 values include Age > 61 years old.  The start date was 04/07/2006.  Anticoagulation responsible provider: Juanda Chance MD, Smitty Cords.  INR POC: 2.9.  Cuvette Lot#: [id number].  Exp: 11/2010.    Anticoagulation Management Assessment/Plan:      The patient's current anticoagulation dose is Warfarin sodium 5 mg tabs: Use as directed by Anticoagulation Clinic.  The target INR is 2.0-3.0.  The next INR is due 11/21/2009.  Anticoagulation instructions were given to patient.  Results were reviewed/authorized by Cloyde Reams, RN, BSN.  She was notified by Cloyde Reams RN.         Prior Anticoagulation Instructions: INR 3.0  Continue on same dosage 1 tablet daily except 1/2 tablet on Mondays, Wednesdays, and Fridays.  Recheck in 4 weeks.    Current Anticoagulation Instructions: INR 2.9  Take 1/2 tablet today then resume same dosage 1 tablet daily except 1/2 tablet on Mondays, Wednesdays, and Fridays.  Recheck in 4 weeks.

## 2010-11-20 NOTE — Medication Information (Signed)
Summary: rov/ewj  Anticoagulant Therapy  Managed by: Cloyde Reams, RN, BSN Referring MD: Nona Dell PCP: Corwin Levins MD Supervising MD: Clifton James MD, Cristal Deer Indication 1: Atrial Fibrillation (ICD-427.31) Lab Used: [facility]  Site: [facility] INR POC 2.5 INR RANGE 2 - 3  Dietary changes: no    Health status changes: no    Bleeding/hemorrhagic complications: yes       Details: Per pt phone call blood on tissue after urination when wiped x 1 episode yesterday.  None since.  Recent/future hospitalizations: no    Any changes in medication regimen? no    Recent/future dental: no  Any missed doses?: no       Is patient compliant with meds? yes      Comments: Pt will continue to monitor for further s+s of bleeding.  Seek immediate medical attentionl if bleeding persists or worsens.    Allergies (verified): No Known Drug Allergies  Anticoagulation Management History:      The patient is taking warfarin and comes in today for a routine follow up visit.  Positive risk factors for bleeding include an age of 74 years or older.  The bleeding index is 'intermediate risk'.  Positive CHADS2 values include History of HTN.  Negative CHADS2 values include Age > 64 years old.  The start date was 04/07/2006.  Anticoagulation responsible provider: Clifton James MD, Cristal Deer.  INR POC: 2.5.  Cuvette Lot#: [id number].  Exp: 02/2011.    Anticoagulation Management Assessment/Plan:      The patient's current anticoagulation dose is Warfarin sodium 5 mg tabs: Use as directed by Anticoagulation Clinic.  The target INR is 2.0-3.0.  The next INR is due 01/30/2010.  Anticoagulation instructions were given to patient.  Results were reviewed/authorized by Cloyde Reams, RN, BSN.  She was notified by Cloyde Reams RN.         Prior Anticoagulation Instructions: INR 2.2 Continue 5mg s daily except 2.5mg s on Mondays, Wednesdays and Fridays. Recheck in 4 weeks.   Current Anticoagulation  Instructions: INR 2.5  Continue on same dosage 1 tablet daily except 1/2 tablet on Mondays, Wednesdays, and Fridays.  Recheck in 4 weeks.

## 2010-11-20 NOTE — Medication Information (Signed)
Summary: rov/sel   Anticoagulant Therapy  Managed by: Weston Brass, PharmD Referring MD: Nona Dell PCP: Corwin Levins MD Supervising MD: Graciela Husbands MD, Viviann Spare Indication 1: Atrial Fibrillation (ICD-427.31) Lab Used: [facility] Site: [facility] INR POC 2.8 INR RANGE 2 - 3  Dietary changes: no    Health status changes: no    Bleeding/hemorrhagic complications: no    Recent/future hospitalizations: no    Any changes in medication regimen? no    Recent/future dental: no  Any missed doses?: no       Is patient compliant with meds? yes       Allergies: No Known Drug Allergies  Anticoagulation Management History:      The patient is taking warfarin and comes in today for a routine follow up visit.  Positive risk factors for bleeding include an age of 74 years or older.  The bleeding index is 'intermediate risk'.  Positive CHADS2 values include History of HTN.  Negative CHADS2 values include Age > 74 years old.  The start date was 04/07/2006.  Anticoagulation responsible provider: Graciela Husbands MD, Viviann Spare.  INR POC: 2.8.  Cuvette Lot#: [id number].  Exp: 09/2011.    Anticoagulation Management Assessment/Plan:      The patient's current anticoagulation dose is Warfarin sodium 5 mg tabs: Use as directed by Anticoagulation Clinic.  The target INR is 2.0-3.0.  The next INR is due 09/25/2010.  Anticoagulation instructions were given to patient.  Results were reviewed/authorized by Weston Brass, PharmD.  She was notified by Weston Brass PharmD.         Prior Anticoagulation Instructions: INR 2.7  Continue taking 1 tablet everyday except 1/2 tablet on Monday, Wednesday, and Friday. Recheck in 4 weeks.   Current Anticoagulation Instructions: INR 2.8  Continue same dose of 1 tablet every day except 1/2 tablet on Monday, Wednesday and Friday.  Recheck INR in 4 weeks.

## 2010-11-20 NOTE — Medication Information (Signed)
Summary: rov/eac  Anticoagulant Therapy  Managed by: Bethena Midget, RN, BSN Referring MD: Nona Dell PCP: Corwin Levins MD Supervising MD: Excell Seltzer MD, Casimiro Needle Indication 1: Atrial Fibrillation (ICD-427.31) Lab Used: [facility] Site: [facility] INR POC 2.5 INR RANGE 2 - 3  Dietary changes: no    Health status changes: no    Bleeding/hemorrhagic complications: no    Recent/future hospitalizations: no    Any changes in medication regimen? no    Recent/future dental: no  Any missed doses?: no       Is patient compliant with meds? yes       Allergies: No Known Drug Allergies  Anticoagulation Management History:      The patient is taking warfarin and comes in today for a routine follow up visit.  Positive risk factors for bleeding include an age of 74 years or older.  The bleeding index is 'intermediate risk'.  Positive CHADS2 values include History of HTN.  Negative CHADS2 values include Age > 68 years old.  The start date was 04/07/2006.  Anticoagulation responsible provider: Excell Seltzer MD, Casimiro Needle.  INR POC: 2.5.  Cuvette Lot#: [id number].  Exp: 05/2011.    Anticoagulation Management Assessment/Plan:      The patient's current anticoagulation dose is Warfarin sodium 5 mg tabs: Use as directed by Anticoagulation Clinic.  The target INR is 2.0-3.0.  The next INR is due 05/01/2010.  Anticoagulation instructions were given to patient.  Results were reviewed/authorized by Bethena Midget, RN, BSN.  She was notified by Bethena Midget, RN, BSN.         Prior Anticoagulation Instructions: INR 2.2  Continue taking 1/2 tablet on Monday, Wednesday, and Friday adn 1 tablet all other days.  Return to clinic in 4 weeks.    Current Anticoagulation Instructions: INR 2.5 Continue 5mg s daily except 2.5mg s on Mondays, Wednesdays and Fridays. Recheck in 4 weeks.

## 2010-11-20 NOTE — Assessment & Plan Note (Signed)
Summary: cold sx/cd   Vital Signs:  Patient profile:   76 year old female Height:      67.5 inches Weight:      220.13 pounds BMI:     34.09 O2 Sat:      98 % on Room air Temp:     98.3 degrees F oral Pulse rate:   64 / minute BP sitting:   112 / 80  (left arm) Cuff size:   large  Vitals Entered By: Zella Ball Ewing CMA Duncan Dull) (June 20, 2010 4:23 PM)  O2 Flow:  Room air  Preventive Care Screening     declines tetanus, dxa , flu shot for now, but will accept colonoscopy, and mammogram  CC: Chest congestion/RE/wellness   Primary Care Provider:  Corwin Levins MD  CC:  Chest congestion/RE/wellness.  History of Present Illness: here for yearly f/u ;  c/o incidental 3 to 4 days facial pain, pressure, fever  and greenish d/c;  also with cough for several weeks nonprod , without wheezing, significant nasal congestion or reflux - pt requests cxr;  Pt denies CP, worsening sob, doe, wheezing, orthopnea, pnd, worsening LE edema, palps, dizziness or syncope Pt denies new neuro symptoms such as headache, facial or extremity weakness  No fever, wt loss, night sweats, loss of appetite or other constitutional symptoms  Trying to follow lower chol diet, and has been wary of trying statin due to risk of myalgia/arthralgias.    Here for wellness Diet: Heart Healthy or DM if diabetic Physical Activities: Sedentary, no regular excercise Depression/mood screen: Negative Hearing: Intact bilateral Visual Acuity: Grossly normal, sees optho q 6 mo with glaucoma ADL's: Capable  Fall Risk: None Home Safety: Good Cognitive Impairment:  Gen appearance, affect, speech, memory, attention & motor skills grossly intact End-of-Life Planning: Advance directive - Full code/I agree   Preventive Screening-Counseling & Management      Drug Use:  no.    Problems Prior to Update: 1)  Fatigue  (ICD-780.79) 2)  Cough  (ICD-786.2) 3)  Sinusitis- Acute-nos  (ICD-461.9) 4)  Glaucoma  (ICD-365.9) 5)  Hyperlipidemia   (ICD-272.4) 6)  Edema  (ICD-782.3) 7)  Preventive Health Care  (ICD-V70.0) 8)  Hypothyroidism, Post-radiation  (ICD-244.1) 9)  Osteopenia  (ICD-733.90) 10)  Hypercholesterolemia  (ICD-272.0) 11)  Abnormal Chest Xray  (ICD-793.1) 12)  Hoarseness  (ICD-784.49) 13)  Hypertension  (ICD-401.9) 14)  Goiter, Multinodular  (ICD-241.1) 15)  Pericarditis, Constrictive  (ICD-423.2) 16)  Atrial Fibrillation, Paroxysmal  (ICD-427.31)  Medications Prior to Update: 1)  Adult Aspirin Low Strength 81 Mg  Tbdp (Aspirin) .... Take 1 By Mouth Qd 2)  Levothyroxine Sodium 75 Mcg  Tabs (Levothyroxine Sodium) .... Take 1 By Mouth Qd 3)  Vitamin D .... Take 1 Tablet By Mouth Once A Day 4)  Micardis Hct 80-25 Mg Tabs (Telmisartan-Hctz) .Marland Kitchen.. 1 By Mouth Once Daily 5)  Fish Oil 1000 Mg Caps (Omega-3 Fatty Acids) .... Take 1 Capsule By Mouth Daily 6)  Warfarin Sodium 5 Mg Tabs (Warfarin Sodium) .... Use As Directed By Anticoagulation Clinic 7)  Metoprolol Succinate 50 Mg Xr24h-Tab (Metoprolol Succinate) .... Take One Tablet By Mouth Daily  Current Medications (verified): 1)  Adult Aspirin Low Strength 81 Mg  Tbdp (Aspirin) .... Take 1 By Mouth Qd 2)  Levothyroxine Sodium 75 Mcg  Tabs (Levothyroxine Sodium) .... Take 1 By Mouth Qd 3)  Vitamin D .... Take 1 Tablet By Mouth Once A Day 4)  Micardis Hct 80-25 Mg Tabs (Telmisartan-Hctz) .Marland KitchenMarland KitchenMarland Kitchen  1 By Mouth Once Daily 5)  Fish Oil 1000 Mg Caps (Omega-3 Fatty Acids) .... Take 1 Capsule By Mouth Daily 6)  Warfarin Sodium 5 Mg Tabs (Warfarin Sodium) .... Use As Directed By Anticoagulation Clinic 7)  Metoprolol Succinate 50 Mg Xr24h-Tab (Metoprolol Succinate) .... Take One Tablet By Mouth Daily 8)  Cephalexin 500 Mg Caps (Cephalexin) .Marland Kitchen.. 1 By Mouth Three Times A Day 9)  Hydrocodone-Homatropine 5-1.5 Mg/61ml Syrp (Hydrocodone-Homatropine) .Marland Kitchen.. 1 Tsp By Mouth Q 6 Hrs As Needed Cough  Allergies (verified): No Known Drug Allergies  Past History:  Past Surgical History: Last  updated: 02/16/2010 Hysterectomy (1992) Pericardial window (2002) s/p radioactive iodine for hyperthyroidism/multinodular goiter  Family History: Last updated: 05/09/2009 brother with asthma mother and father with arthritis sister with cervical cancer  Social History: Last updated: 06/20/2010 Single no children Former Smoker retired - office work Alcohol use-no Drug use-no  Risk Factors: Smoking Status: quit (05/09/2009)  Past Medical History: Heart Murmur Positive Tb (2002) Glaucoma ABNORMAL CHEST XRAY (ICD-793.1) HOARSENESS (ICD-784.49) HYPERTENSION (ICD-401.9) HYPOTHYROIDISM (ICD-244.9) GOITER, MULTINODULAR (ICD-241.1) PERICARDITIS, CONSTRICTIVE (ICD-423.2) ATRIAL FIBRILLATION, PAROXYSMAL (ICD-427.31) Hyperlipidemia Hypothyroidism Hypertension glaucoma  MD roster:  Card - Dr Dareen Piano - Dr Craige Cotta  Family History: Reviewed history from 05/09/2009 and no changes required. brother with asthma mother and father with arthritis sister with cervical cancer  Social History: Reviewed history from 05/09/2009 and no changes required. Single no children Former Smoker retired - office work Alcohol use-no Drug use-no Drug Use:  no  Review of Systems  The patient denies anorexia, fever, weight loss, vision loss, decreased hearing, hoarseness, chest pain, syncope, dyspnea on exertion, peripheral edema, prolonged cough, headaches, hemoptysis, abdominal pain, melena, hematochezia, severe indigestion/heartburn, hematuria, muscle weakness, suspicious skin lesions, transient blindness, difficulty walking, depression, unusual weight change, abnormal bleeding, enlarged lymph nodes, and angioedema.         all otherwise negative per pt -  except for mild fatigue , without OSA symtpoms, sleep problem or worsening depressive symptoms  Physical Exam  General:  alert and overweight-appearing.  , mild ill  Head:  normocephalic and atraumatic.   Eyes:  vision  grossly intact, pupils equal, and pupils round.   Ears:  R ear normal.  , left tm marked erythema, sinus tender bilat, canals clear Nose:  nasal dischargemucosal pallor and mucosal edema.   Mouth:  pharyngeal erythema and fair dentition.   Neck:  supple and no masses.   Lungs:  normal respiratory effort and normal breath sounds.   Heart:  normal rate and regular rhythm.   Abdomen:  soft, non-tender, and normal bowel sounds.   Msk:  no joint tenderness and no joint swelling.   Extremities:  no edema, no erythema  Neurologic:  cranial nerves II-XII intact and strength normal in all extremities, cognitive intact to orientation, recall, naming, and repetition  Skin:  color normal and no rashes.   Psych:  not depressed appearing and slightly anxious.     Impression & Recommendations:  Problem # 1:  Preventive Health Care (ICD-V70.0) Assessment Unchanged  Overall doing well, age appropriate education and counseling updated and referral for appropriate preventive services done unless declined, immunizations up to date or declined, diet counseling done if overweight, urged to quit smoking if smokes , most recent labs reviewed and current ordered if appropriate, ecg reviewed or declined (interpretation per ECG scanned in the EMR if  done); information regarding Medicare Prevention requirements given if appropriate; speciality referrals updated as appropriate   Orders: Gastroenterology Referral (GI) Medicare -1st Annual Wellness Visit [phone])  Problem # 2:  SINUSITIS- ACUTE-NOS (ICD-461.9) Assessment: New  Her updated medication list for this problem includes:    Cephalexin 500 Mg Caps (Cephalexin) .Marland Kitchen... 1 by mouth three times a day    Hydrocodone-homatropine 5-1.5 Mg/69ml Syrp (Hydrocodone-homatropine) .Marland Kitchen... 1 tsp by mouth q 6 hrs as needed cough treat as above, f/u any worsening signs or symptoms   Orders: Prescription Created Electronically [phone])  Problem # 3:  COUGH (ICD-786.2)  most  likely related to above ; to check cxr per pt request - r/o pna  Orders: T-2 View CXR, Same Day (71020.5TC)  Problem # 4:  FATIGUE (ICD-780.79)  exam o/w benign except for known co-morbidities, to check labs below; follow with expectant management   Pt with labs to do on sept 6 per her request  Problem # 5:  HYPERLIPIDEMIA (ICD-272.4)  d/w pt - cont's to declines statin - wants to cont diet only  Labs Reviewed: SGOT: 22 (05/09/2009)   SGPT: 18 (05/09/2009)   HDL:37.20 (02/16/2010), 36.40 (05/09/2009)  LDL:130 (05/09/2009), DEL (08/23/2008)  Chol:208 (02/16/2010), 185 (05/09/2009)  Trig:157.0 (02/16/2010), 91.0 (05/09/2009)  Problem # 6:  HYPOTHYROIDISM, POST-RADIATION (ICD-244.1)  Her updated medication list for this problem includes:    Levothyroxine Sodium 75 Mcg Tabs (Levothyroxine sodium) .Marland Kitchen... Take 1 by mouth qd  Labs Reviewed: TSH: 3.11 (08/29/2009)    Chol: 208 (02/16/2010)   HDL: 37.20 (02/16/2010)   LDL: 130 (05/09/2009)   TG: 157.0 (02/16/2010) for TSH f/u with next labs, pt plans to f/u with Dr Everardo All as well   Problem # 7:  HYPERTENSION (ICD-401.9)  Her updated medication list for this problem includes:    Micardis Hct 80-25 Mg Tabs (Telmisartan-hctz) .Marland Kitchen... 1 by mouth once daily    Metoprolol Succinate 50 Mg Xr24h-tab (Metoprolol succinate) .Marland Kitchen... Take one tablet by mouth daily  BP today: 112/80 Prior BP: 122/65 (02/16/2010)  Labs Reviewed: K+: 3.8 (02/16/2010) Creat: : 0.7 (02/16/2010)   Chol: 208 (02/16/2010)   HDL: 37.20 (02/16/2010)   LDL: 130 (05/09/2009)   TG: 157.0 (02/16/2010) stable overall by hx and exam, ok to continue meds/tx as is   Complete Medication List: 1)  Adult Aspirin Low Strength 81 Mg Tbdp (Aspirin) .... Take 1 by mouth qd 2)  Levothyroxine Sodium 75 Mcg Tabs (Levothyroxine sodium) .... Take 1 by mouth qd 3)  Vitamin D  .... Take 1 tablet by mouth once a day 4)  Micardis Hct 80-25 Mg Tabs (Telmisartan-hctz) .Marland Kitchen.. 1 by mouth once  daily 5)  Fish Oil 1000 Mg Caps (Omega-3 fatty acids) .... Take 1 capsule by mouth daily 6)  Warfarin Sodium 5 Mg Tabs (Warfarin sodium) .... Use as directed by anticoagulation clinic 7)  Metoprolol Succinate 50 Mg Xr24h-tab (Metoprolol succinate) .... Take one tablet by mouth daily 8)  Cephalexin 500 Mg Caps (Cephalexin) .Marland Kitchen.. 1 by mouth three times a day 9)  Hydrocodone-homatropine 5-1.5 Mg/73ml Syrp (Hydrocodone-homatropine) .Marland Kitchen.. 1 tsp by mouth q 6 hrs as needed cough  Patient Instructions: 1)  Please take all new medications as prescribed 2)  Continue all previous medications as before this visit 3)  Please go to Radiology in the basement level for your X-Ray today  4)  please call for your yearly mammogram - consider [facility], or [facility] on [address] 5)  You will be contacted about the  referral(s) to: colonscopy 6)  Please go to the Lab in the basement for your blood and/or urine tests for sept 6 next wk  -  7)  Vit D  733.90 8)  BMET, cbc, LFT's, TSH, ESR, B12/folate, iron panel  780.79 9)  Lipids 272.0 10)  Please schedule a follow-up appointment in 1 year for your "yearly medicare exam" Prescriptions: HYDROCODONE-HOMATROPINE 5-1.5 MG/5ML SYRP (HYDROCODONE-HOMATROPINE) 1 tsp by mouth q 6 hrs as needed cough  #6oz x 1   Entered and Authorized by:   Corwin Levins MD   Signed by:   Corwin Levins MD on 06/20/2010   Method used:   Print then Give to Patient   RxID:   [id number] CEPHALEXIN 500 MG CAPS (CEPHALEXIN) 1 by mouth three times a day  #30 x 0   Entered and Authorized by:   Corwin Levins MD   Signed by:   Corwin Levins MD on 06/20/2010   Method used:   Print then Give to Patient   RxID:   [id number]

## 2010-11-20 NOTE — Assessment & Plan Note (Signed)
Summary: COUGH/ WANTING COUGH SYRUP/NWS   Vital Signs:  Patient profile:   76 year old female Height:      67.5 inches Weight:      222.13 pounds BMI:     34.40 O2 Sat:      94 % on Room air Temp:     98.3 degrees F oral Pulse rate:   77 / minute BP sitting:   128 / 70  (left arm) Cuff size:   large  Vitals Entered By: Zella Ball Ewing CMA Duncan Dull) (September 04, 2010 11:04 AM)  O2 Flow:  Room air CC: Cough/RE   Primary Care Cleve Paolillo:  Corwin Levins MD  CC:  Cough/RE.  History of Present Illness: here with acute concern of 4 days onset fever, ST, prod cough , greenish sputum, and today onset mild sob/wheezing/doe. but Pt denies CP,  orthopnea, pnd, worsening LE edema, palps, dizziness or syncope . Pt denies new neuro symptoms such as headache, facial or extremity weakness  Pt denies polydipsia, polyuria.  Tolerating the new statin well, wants to go over labs today.  Trying to follow lower chol diet as well.    Problems Prior to Update: 1)  Bronchitis-acute  (ICD-466.0) 2)  Wheezing  (ICD-786.07) 3)  Fatigue  (ICD-780.79) 4)  Cough  (ICD-786.2) 5)  Sinusitis- Acute-nos  (ICD-461.9) 6)  Glaucoma  (ICD-365.9) 7)  Hyperlipidemia  (ICD-272.4) 8)  Edema  (ICD-782.3) 9)  Preventive Health Care  (ICD-V70.0) 10)  Hypothyroidism, Post-radiation  (ICD-244.1) 11)  Osteopenia  (ICD-733.90) 12)  Hypercholesterolemia  (ICD-272.0) 13)  Abnormal Chest Xray  (ICD-793.1) 14)  Hoarseness  (ICD-784.49) 15)  Hypertension  (ICD-401.9) 16)  Goiter, Multinodular  (ICD-241.1) 17)  Pericarditis, Constrictive  (ICD-423.2) 18)  Atrial Fibrillation, Paroxysmal  (ICD-427.31)  Medications Prior to Update: 1)  Adult Aspirin Low Strength 81 Mg  Tbdp (Aspirin) .... Take 1 By Mouth Qd 2)  Levothyroxine Sodium 75 Mcg  Tabs (Levothyroxine Sodium) .... Take 1 By Mouth Qd 3)  Vitamin D .... Take 1 Tablet By Mouth Once A Day 4)  Micardis Hct 80-25 Mg Tabs (Telmisartan-Hctz) .Marland Kitchen.. 1 By Mouth Once Daily 5)  Fish Oil  1000 Mg Caps (Omega-3 Fatty Acids) .... Take 1 Capsule By Mouth Daily 6)  Warfarin Sodium 5 Mg Tabs (Warfarin Sodium) .... Use As Directed By Anticoagulation Clinic 7)  Metoprolol Succinate 50 Mg Xr24h-Tab (Metoprolol Succinate) .... Take One Tablet By Mouth Daily 8)  Cephalexin 500 Mg Caps (Cephalexin) .Marland Kitchen.. 1 By Mouth Three Times A Day 9)  Hydrocodone-Homatropine 5-1.5 Mg/38ml Syrp (Hydrocodone-Homatropine) .Marland Kitchen.. 1 Tsp By Mouth Q 6 Hrs As Needed Cough 10)  Simvastatin 20 Mg Tabs (Simvastatin) .Marland Kitchen.. 1po Once Daily  Current Medications (verified): 1)  Adult Aspirin Low Strength 81 Mg  Tbdp (Aspirin) .... Take 1 By Mouth Qd 2)  Levothyroxine Sodium 75 Mcg  Tabs (Levothyroxine Sodium) .... Take 1 By Mouth Qd 3)  Vitamin D .... Take 1 Tablet By Mouth Once A Day 4)  Micardis Hct 80-25 Mg Tabs (Telmisartan-Hctz) .Marland Kitchen.. 1 By Mouth Once Daily 5)  Fish Oil 1000 Mg Caps (Omega-3 Fatty Acids) .... Take 1 Capsule By Mouth Daily 6)  Warfarin Sodium 5 Mg Tabs (Warfarin Sodium) .... Use As Directed By Anticoagulation Clinic 7)  Metoprolol Succinate 50 Mg Xr24h-Tab (Metoprolol Succinate) .... Take One Tablet By Mouth Daily 8)  Levofloxacin 500 Mg Tabs (Levofloxacin) .Marland Kitchen.. 1 By Mouth Once Daily 9)  Hydrocodone-Homatropine 5-1.5 Mg/81ml Syrp (Hydrocodone-Homatropine) .Marland Kitchen.. 1 Tsp By Mouth  Q 6 Hrs As Needed Cough 10)  Simvastatin 20 Mg Tabs (Simvastatin) .Marland Kitchen.. 1po Once Daily 11)  Prednisone 10 Mg Tabs (Prednisone) .... 3po Qd For 3days, Then 2po Qd For 3days, Then 1po Qd For 3days, Then Stop  Allergies (verified): No Known Drug Allergies  Past History:  Past Medical History: Last updated: 06/20/2010 Heart Murmur Positive Tb (2002) Glaucoma ABNORMAL CHEST XRAY (ICD-793.1) HOARSENESS (ICD-784.49) HYPERTENSION (ICD-401.9) HYPOTHYROIDISM (ICD-244.9) GOITER, MULTINODULAR (ICD-241.1) PERICARDITIS, CONSTRICTIVE (ICD-423.2) ATRIAL FIBRILLATION, PAROXYSMAL  (ICD-427.31) Hyperlipidemia Hypothyroidism Hypertension glaucoma  MD roster:  Card - Dr Dareen Piano - Dr Craige Cotta  Past Surgical History: Last updated: 02/16/2010 Hysterectomy (1992) Pericardial window (2002) s/p radioactive iodine for hyperthyroidism/multinodular goiter  Social History: Last updated: 06/20/2010 Single no children Former Smoker retired - office work Alcohol use-no Drug use-no  Risk Factors: Smoking Status: quit (05/09/2009)  Review of Systems       all otherwise negative per pt -    Physical Exam  General:  alert and overweight-appearing.  , mild ill  Head:  normocephalic and atraumatic.   Eyes:  vision grossly intact, pupils equal, and pupils round.   Ears:  bilat tm's red, sinus nontender bilat Nose:  nasal dischargemucosal pallor and mucosal edema.   Mouth:  pharyngeal erythema and fair dentition.   Neck:  supple and no masses.   Lungs:  normal respiratory effort, R decreased breath sounds, R wheezes, L decreased breath sounds, and L wheezes.   Heart:  normal rate and regular rhythm.   Extremities:  no edema, no erythema    Impression & Recommendations:  Problem # 1:  BRONCHITIS-ACUTE (ICD-466.0)  Her updated medication list for this problem includes:    Levofloxacin 500 Mg Tabs (Levofloxacin) .Marland Kitchen... 1 by mouth once daily    Hydrocodone-homatropine 5-1.5 Mg/2ml Syrp (Hydrocodone-homatropine) .Marland Kitchen... 1 tsp by mouth q 6 hrs as needed cough treat as above, f/u any worsening signs or symptoms   Problem # 2:  WHEEZING (ICD-786.07)  likely diue to above, for predpack for home, f/u any worsening s/s  Problem # 3:  HYPERLIPIDEMIA (ICD-272.4)  Her updated medication list for this problem includes:    Simvastatin 20 Mg Tabs (Simvastatin) .Marland Kitchen... 1po once daily  Labs Reviewed: SGOT: 21 (07/03/2010)   SGPT: 16 (07/03/2010)   HDL:33.00 (07/03/2010), 37.20 (02/16/2010)  LDL:130 (05/09/2009), DEL (08/23/2008)  Chol:202 (07/03/2010), 208  (02/16/2010)  Trig:118.0 (07/03/2010), 157.0 (02/16/2010) stable overall by hx and exam, ok to continue meds/tx as is , declines further labs today  Problem # 4:  HYPERTENSION (ICD-401.9)  Her updated medication list for this problem includes:    Micardis Hct 80-25 Mg Tabs (Telmisartan-hctz) .Marland Kitchen... 1 by mouth once daily    Metoprolol Succinate 50 Mg Xr24h-tab (Metoprolol succinate) .Marland Kitchen... Take one tablet by mouth daily  BP today: 128/70 Prior BP: 112/80 (06/20/2010)  Labs Reviewed: K+: 3.8 (07/03/2010) Creat: : 0.8 (07/03/2010)   Chol: 202 (07/03/2010)   HDL: 33.00 (07/03/2010)   LDL: 130 (05/09/2009)   TG: 118.0 (07/03/2010) stable overall by hx and exam, ok to continue meds/tx as is   Complete Medication List: 1)  Adult Aspirin Low Strength 81 Mg Tbdp (Aspirin) .... Take 1 by mouth qd 2)  Levothyroxine Sodium 75 Mcg Tabs (Levothyroxine sodium) .... Take 1 by mouth qd 3)  Vitamin D  .... Take 1 tablet by mouth once a day 4)  Micardis Hct  80-25 Mg Tabs (Telmisartan-hctz) .Marland Kitchen.. 1 by mouth once daily 5)  Fish Oil 1000 Mg Caps (Omega-3 fatty acids) .... Take 1 capsule by mouth daily 6)  Warfarin Sodium 5 Mg Tabs (Warfarin sodium) .... Use as directed by anticoagulation clinic 7)  Metoprolol Succinate 50 Mg Xr24h-tab (Metoprolol succinate) .... Take one tablet by mouth daily 8)  Levofloxacin 500 Mg Tabs (Levofloxacin) .Marland Kitchen.. 1 by mouth once daily 9)  Hydrocodone-homatropine 5-1.5 Mg/13ml Syrp (Hydrocodone-homatropine) .Marland Kitchen.. 1 tsp by mouth q 6 hrs as needed cough 10)  Simvastatin 20 Mg Tabs (Simvastatin) .Marland Kitchen.. 1po once daily 11)  Prednisone 10 Mg Tabs (Prednisone) .... 3po qd for 3days, then 2po qd for 3days, then 1po qd for 3days, then stop  Patient Instructions: 1)  Please take all new medications as prescribed 2)  Continue all previous medications as before this visit 3)  Please schedule a follow-up appointment as needed. Prescriptions: PREDNISONE 10 MG TABS (PREDNISONE) 3po qd for 3days,  then 2po qd for 3days, then 1po qd for 3days, then stop  #18 x 0   Entered and Authorized by:   Corwin Levins MD   Signed by:   Corwin Levins MD on 09/04/2010   Method used:   Print then Give to Patient   RxID:   [id number] LEVOFLOXACIN 500 MG TABS (LEVOFLOXACIN) 1 by mouth once daily  #10 x 0   Entered and Authorized by:   Corwin Levins MD   Signed by:   Corwin Levins MD on 09/04/2010   Method used:   Print then Give to Patient   RxID:   [id number] HYDROCODONE-HOMATROPINE 5-1.5 MG/5ML SYRP (HYDROCODONE-HOMATROPINE) 1 tsp by mouth q 6 hrs as needed cough  #6oz x 1   Entered and Authorized by:   Corwin Levins MD   Signed by:   Corwin Levins MD on 09/04/2010   Method used:   Print then Give to Patient   RxID:   [id number]    Orders Added: 1)  Est. Patient Level IV [51025]

## 2010-11-20 NOTE — Miscellaneous (Signed)
Summary: Orders Update  Clinical Lists Changes  Orders: Added new Test order of T-Vitamin D (25-Hydroxy) ([id number]) - Signed 

## 2010-11-20 NOTE — Medication Information (Signed)
Summary: rov/tm  Anticoagulant Therapy  Managed by: Eda Keys, PharmD Referring MD: Nona Dell PCP: Corwin Levins MD Supervising MD: Antoine Poche MD, Fayrene Fearing Indication 1: Atrial Fibrillation (ICD-427.31) Lab Used: [facility] Site: [facility] INR POC 2.2 INR RANGE 2 - 3  Dietary changes: yes       Details: pt has had more lettuce than norm  Health status changes: no    Bleeding/hemorrhagic complications: no    Recent/future hospitalizations: no    Any changes in medication regimen? no    Recent/future dental: no  Any missed doses?: no       Is patient compliant with meds? yes       Allergies: No Known Drug Allergies  Anticoagulation Management History:      The patient is taking warfarin and comes in today for a routine follow up visit.  Positive risk factors for bleeding include an age of 74 years or older.  The bleeding index is 'intermediate risk'.  Positive CHADS2 values include History of HTN.  Negative CHADS2 values include Age > 30 years old.  The start date was 04/07/2006.  Anticoagulation responsible provider: Antoine Poche MD, Fayrene Fearing.  INR POC: 2.2.  Cuvette Lot#: [id number].  Exp: 05/2011.    Anticoagulation Management Assessment/Plan:      The patient's current anticoagulation dose is Warfarin sodium 5 mg tabs: Use as directed by Anticoagulation Clinic.  The target INR is 2.0-3.0.  The next INR is due 03/30/2010.  Anticoagulation instructions were given to patient.  Results were reviewed/authorized by Eda Keys, PharmD.  She was notified by Eda Keys.         Prior Anticoagulation Instructions: INR 2.4 Continue 5mg s daily except 2.5mg s on Mondays, Wednesdays and Fridays. Recheck in 4 weeks.   Current Anticoagulation Instructions: INR 2.2  Continue taking 1/2 tablet on Monday, Wednesday, and Friday adn 1 tablet all other days.  Return to clinic in 4 weeks.

## 2010-11-20 NOTE — Medication Information (Signed)
Summary: rov/ewj  Anticoagulant Therapy  Managed by: Bethena Midget, RN, BSN Referring MD: Nona Dell PCP: Corwin Levins MD Supervising MD: Excell Seltzer MD, Casimiro Needle Indication 1: Atrial Fibrillation (ICD-427.31) Lab Used: [facility] Site: [facility] INR POC 2.4 INR RANGE 2 - 3  Dietary changes: no    Health status changes: no    Bleeding/hemorrhagic complications: no    Recent/future hospitalizations: no    Any changes in medication regimen? no    Recent/future dental: no  Any missed doses?: no       Is patient compliant with meds? yes       Allergies: No Known Drug Allergies  Anticoagulation Management History:      The patient is taking warfarin and comes in today for a routine follow up visit.  Positive risk factors for bleeding include an age of 74 years or older.  The bleeding index is 'intermediate risk'.  Positive CHADS2 values include History of HTN.  Negative CHADS2 values include Age > 34 years old.  The start date was 04/07/2006.  Anticoagulation responsible provider: Excell Seltzer MD, Casimiro Needle.  INR POC: 2.4.  Cuvette Lot#: [id number].  Exp: 02/2011.    Anticoagulation Management Assessment/Plan:      The patient's current anticoagulation dose is Warfarin sodium 5 mg tabs: Use as directed by Anticoagulation Clinic.  The target INR is 2.0-3.0.  The next INR is due 02/27/2010.  Anticoagulation instructions were given to patient.  Results were reviewed/authorized by Bethena Midget, RN, BSN.  She was notified by Bethena Midget, RN, BSN.         Prior Anticoagulation Instructions: INR 2.5  Continue on same dosage 1 tablet daily except 1/2 tablet on Mondays, Wednesdays, and Fridays.  Recheck in 4 weeks.    Current Anticoagulation Instructions: INR 2.4 Continue 5mg s daily except 2.5mg s on Mondays, Wednesdays and Fridays. Recheck in 4 weeks.

## 2010-11-20 NOTE — Medication Information (Signed)
Summary: rov/tm  Anticoagulant Therapy  Managed by: Bethena Midget, RN, BSN Referring MD: Nona Dell PCP: Corwin Levins MD Supervising MD: Clifton James MD, Cristal Deer Indication 1: Atrial Fibrillation (ICD-427.31) Lab Used: [facility]  Site: [facility] INR POC 2.4 INR RANGE 2 - 3  Dietary changes: no    Health status changes: no    Bleeding/hemorrhagic complications: no    Recent/future hospitalizations: no    Any changes in medication regimen? no    Recent/future dental: no  Any missed doses?: no       Is patient compliant with meds? yes       Allergies: No Known Drug Allergies  Anticoagulation Management History:      The patient is taking warfarin and comes in today for a routine follow up visit.  Positive risk factors for bleeding include an age of 35 years or older.  The bleeding index is 'intermediate risk'.  Positive CHADS2 values include History of HTN.  Negative CHADS2 values include Age > 54 years old.  The start date was 04/07/2006.  Anticoagulation responsible provider: Clifton James MD, Cristal Deer.  INR POC: 2.4.  Cuvette Lot#: [id number].  Exp: 06/2011.    Anticoagulation Management Assessment/Plan:      The patient's current anticoagulation dose is Warfarin sodium 5 mg tabs: Use as directed by Anticoagulation Clinic.  The target INR is 2.0-3.0.  The next INR is due 05/29/2010.  Anticoagulation instructions were given to patient.  Results were reviewed/authorized by Bethena Midget, RN, BSN.         Prior Anticoagulation Instructions: INR 2.5 Continue 5mg s daily except 2.5mg s on Mondays, Wednesdays and Fridays. Recheck in 4 weeks.   Current Anticoagulation Instructions: INR, 2.4 Resume normal dosing schedule. Take half a tablet on Monday, Wednesday , Friday and take one tablet on all other days. Return in 4 weeks.

## 2010-11-20 NOTE — Progress Notes (Signed)
Summary: r/s Coumadin appt secondary to cold  Phone Note Call from Patient   Caller: Patient Call For: Coumadin Clinic Summary of Call: Pt called has appt today at 11:30, pt requesting to r/s appt until next Tuesdays 07/03/10, she has a cold and an ear infection.  Currently on Cephalexin  and Hydrocodone cough syrup.  Advised pt abx should not effect coumadin and r/s appt for 07/03/10 at 11:30am.  Initial call taken by: Cloyde Reams RN,  June 26, 2010 8:28 AM

## 2010-11-20 NOTE — Medication Information (Signed)
Summary: Monica Mora  Anticoagulant Therapy  Managed by: Shelby Dubin, PharmD, BCPS, CPP Referring MD: Nona Dell PCP: Corwin Levins MD Supervising MD: Myrtis Ser MD, Tinnie Gens Indication 1: Atrial Fibrillation (ICD-427.31) Lab Used: [facility] [place] Site: [facility] INR POC 1.9 INR RANGE 2 - 3  Dietary changes: no    Health status changes: no    Bleeding/hemorrhagic complications: no    Recent/future hospitalizations: no    Any changes in medication regimen? no    Recent/future dental: no  Any missed doses?: yes     Details: missed Friday's dose  Is patient compliant with meds? yes       Current Medications (verified): 1)  Adult Aspirin Low Strength 81 Mg  Tbdp (Aspirin) .... Take 1 By Mouth Qd 2)  Levothyroxine Sodium 75 Mcg  Tabs (Levothyroxine Sodium) .... Take 1 By Mouth Qd 3)  Centrum Silver   Tabs (Multiple Vitamins-Minerals) .... Take 1 Tablet By Mouth Once A Day 4)  Vitamin D .... Take 1 Tablet By Mouth Once A Day 5)  Micardis Hct 80-25 Mg Tabs (Telmisartan-Hctz) .Marland Kitchen.. 1 By Mouth Once Daily 6)  Fish Oil 1000 Mg Caps (Omega-3 Fatty Acids) .... Take 1 Capsule By Mouth Daily 7)  Warfarin Sodium 5 Mg Tabs (Warfarin Sodium) .... Use As Directed By Anticoagulation Clinic 8)  Metoprolol Succinate 50 Mg Xr24h-Tab (Metoprolol Succinate) .... Take One Tablet By Mouth Daily  Allergies (verified): No Known Drug Allergies  Anticoagulation Management History:      The patient is taking warfarin and comes in today for a routine follow up visit.  Positive risk factors for bleeding include an age of 2 years or older.  The bleeding index is 'intermediate risk'.  Positive CHADS2 values include History of HTN.  Negative CHADS2 values include Age > 44 years old.  The start date was 04/07/2006.  Anticoagulation responsible provider: Myrtis Ser MD, Tinnie Gens.  INR POC: 1.9.  Cuvette Lot#: [id number].  Exp: 01/2011.    Anticoagulation Management Assessment/Plan:      The patient's current anticoagulation  dose is Warfarin sodium 5 mg tabs: Use as directed by Anticoagulation Clinic.  The target INR is 2.0-3.0.  The next INR is due 12/19/2009.  Anticoagulation instructions were given to patient.  Results were reviewed/authorized by Shelby Dubin, PharmD, BCPS, CPP.  She was notified by Shelby Dubin PharmD, BCPS, CPP.         Prior Anticoagulation Instructions: INR 2.9  Take 1/2 tablet today then resume same dosage 1 tablet daily except 1/2 tablet on Mondays, Wednesdays, and Fridays.  Recheck in 4 weeks.    Current Anticoagulation Instructions: INR 1.9  Take 1.5 tabs today then resume 1 tab daily except 0.5 tab on Mondays, Wednesdays, Fridays.  Recheck in 3 - 4 weeks.

## 2010-11-20 NOTE — Medication Information (Signed)
Summary: rov/ewj  Anticoagulant Therapy  Managed by: Weston Brass, PharmD Referring MD: Nona Dell PCP: Corwin Levins MD Supervising MD: Juanda Chance MD, Bruce Indication 1: Atrial Fibrillation (ICD-427.31) Lab Used: [facility] [place] Site: [facility] INR POC 2.7 INR RANGE 2 - 3  Dietary changes: no    Health status changes: no    Bleeding/hemorrhagic complications: no    Recent/future hospitalizations: no    Any changes in medication regimen? no    Recent/future dental: no  Any missed doses?: no       Is patient compliant with meds? yes       Allergies: No Known Drug Allergies  Anticoagulation Management History:      The patient is taking warfarin and comes in today for a routine follow up visit.  Positive risk factors for bleeding include an age of 74 years or older.  The bleeding index is 'intermediate risk'.  Positive CHADS2 values include History of HTN.  Negative CHADS2 values include Age > 22 years old.  The start date was 04/07/2006.  Anticoagulation responsible Lucila Klecka: Juanda Chance MD, Smitty Cords.  INR POC: 2.7.  Cuvette Lot#: [id number].  Exp: 08/2011.    Anticoagulation Management Assessment/Plan:      The patient's current anticoagulation dose is Warfarin sodium 5 mg tabs: Use as directed by Anticoagulation Clinic.  The target INR is 2.0-3.0.  The next INR is due 08/28/2010.  Anticoagulation instructions were given to patient.  Results were reviewed/authorized by Weston Brass, PharmD.  She was notified by Ilean Skill D candidate.         Prior Anticoagulation Instructions: INR 2.6  Continue on same dosage 1 tablet daily except 1/2 tablet on Mondays, Wednesdays, and Fridays.  Recheck in 4 weeks.    Current Anticoagulation Instructions: INR 2.7  Continue taking 1 tablet everyday except 1/2 tablet on Monday, Wednesday, and Friday. Recheck in 4 weeks.

## 2010-11-20 NOTE — Progress Notes (Signed)
Summary: Test results   Phone Note Call from Patient Call back at Home Phone [phone]   Caller: Patient Reason for Call: Lab or Test Results Initial call taken by: Judie Grieve,  Feb 27, 2010 10:17 AM  Follow-up for Phone Call        I spoke with patient and gave her lab results and Dr. Tenny Craw recommendations.  Mrs. Crothers said she would call back tomorrow once she has made her decision about Zocor recommendation. Lisabeth Devoid RN

## 2010-11-21 ENCOUNTER — Telehealth: Payer: Self-pay | Admitting: Internal Medicine

## 2010-11-22 NOTE — Medication Information (Signed)
Summary: rov/tm   Anticoagulant Therapy  Managed by: Weston Brass, PharmD Referring MD: Nona Dell PCP: Corwin Levins MD Supervising MD: Jens Som MD, Arlys John Indication 1: Atrial Fibrillation (ICD-427.31) Lab Used: [facility] [place] Site: [facility] INR RANGE 2 - 3  Dietary changes: no    Health status changes: yes       Details: having colonoscopy on 1/17.  Okay to hold Coumadin x 5 days per Dr. Tenny Craw  Bleeding/hemorrhagic complications: no    Recent/future hospitalizations: no    Any changes in medication regimen? no    Recent/future dental: no  Any missed doses?: no       Is patient compliant with meds? yes       Allergies: No Known Drug Allergies  Anticoagulation Management History:      The patient is taking warfarin and comes in today for a routine follow up visit.  Positive risk factors for bleeding include an age of 74 years or older.  The bleeding index is 'intermediate risk'.  Positive CHADS2 values include History of HTN.  Negative CHADS2 values include Age > 74 years old.  The start date was 04/07/2006.  Anticoagulation responsible provider: Jens Som MD, Arlys John.  Cuvette Lot#: [id number].  Exp: 11/2011.    Anticoagulation Management Assessment/Plan:      The patient's current anticoagulation dose is Warfarin sodium 5 mg tabs: Use as directed by Anticoagulation Clinic.  The target INR is 2.0-3.0.  The next INR is due 11/13/2010.  Anticoagulation instructions were given to patient.  Results were reviewed/authorized by Weston Brass, PharmD.  She was notified by Weston Brass PharmD.         Prior Anticoagulation Instructions: INR 3.0 Today take 1/2 pill then resume 1 pill everyday except 1/2 pill on Mondays, Wednesdays and Fridays. Recheck in 4 weeks.   Current Anticoagulation Instructions: INR 3.3  Skip today's dose of Coumadin then resume same dose of 1 tablet every day except 1/2 tablet on Monday, Wednesday and Friday.  Take last dose on 1/12.  Colonoscopy on 1/17.   Restart Coumadin at normal dose when okay with Dr. Arlyce Dice.  Recheck INR 1 week after procedure.

## 2010-11-22 NOTE — Assessment & Plan Note (Signed)
Summary: FELL 1/17 / WANTS AN X-RAY/ DIDN'T WANT SOONER/NWS   Vital Signs:  Patient profile:   76 year old female Height:      67.5 inches Weight:      223.13 pounds BMI:     34.56 O2 Sat:      97 % on Room air Temp:     97.9 degrees F oral Pulse rate:   64 / minute BP sitting:   110 / 70  (left arm) Cuff size:   large  Vitals Entered By: Zella Ball Ewing CMA Duncan Dull) (November 14, 2010 11:38 AM)  O2 Flow:  Room air CC: fell one week ago, right side soreness/RE   Primary Care Provider:  Corwin Levins MD  CC:  fell one week ago and right side soreness/RE.  History of Present Illness: here to f/u with acute after fell with getting off balance getting up out of a chair and turning at the same time, 3 days ago to right side on a hard surface/plant with large bruise and pain that persists to the right lateral chest at bra level that is pleuritic and persistent to  a degree she thinks may be more than just a ocntusion - overall moderate;  she happened to be off the coumadin and asa as she was having the colonoscopy.  Pt denies other CP, worsening sob, doe, wheezing, orthopnea, pnd, worsening LE edema, palps, dizziness or syncope .  Pt denies new neuro symptoms such as headache, facial or extremity weakness  Pt denies polydipsia, polyuria  Overall good compliance with meds, trying to follow low chol diet, wt stable, little excercise however   Problems Prior to Update: 1)  Chest Pain  (ICD-786.50) 2)  Coumadin Therapy  (ICD-V58.61) 3)  Special Screening For Malignant Neoplasms Colon  (ICD-V76.51) 4)  Other and Unspecified Coagulation Defects  (ICD-286.9) 5)  Wheezing  (ICD-786.07) 6)  Fatigue  (ICD-780.79) 7)  Cough  (ICD-786.2) 8)  Sinusitis- Acute-nos  (ICD-461.9) 9)  Glaucoma  (ICD-365.9) 10)  Hyperlipidemia  (ICD-272.4) 11)  Edema  (ICD-782.3) 12)  Preventive Health Care  (ICD-V70.0) 13)  Hypothyroidism, Post-radiation  (ICD-244.1) 14)  Osteopenia  (ICD-733.90) 15)  Hypercholesterolemia   (ICD-272.0) 16)  Abnormal Chest Xray  (ICD-793.1) 17)  Hoarseness  (ICD-784.49) 18)  Hypertension  (ICD-401.9) 19)  Goiter, Multinodular  (ICD-241.1) 20)  Pericarditis, Constrictive  (ICD-423.2) 21)  Atrial Fibrillation, Paroxysmal  (ICD-427.31)  Medications Prior to Update: 1)  Baby Aspirin 81 Mg Chew (Aspirin) .... Once Daily 2)  Levothyroxine Sodium 75 Mcg  Tabs (Levothyroxine Sodium) .... Take 1 By Mouth Qd 3)  Vitamin D 1000 Unit Tabs (Cholecalciferol) .... One Tablet By Mouth Once Daily 4)  Fish Oil 1000 Mg Caps (Omega-3 Fatty Acids) .... Take 1 Capsule By Mouth Daily 5)  Warfarin Sodium 5 Mg Tabs (Warfarin Sodium) .... Use As Directed By Anticoagulation Clinic 6)  Metoprolol Succinate 50 Mg Xr24h-Tab (Metoprolol Succinate) .... Take One Tablet By Mouth Daily 7)  Simvastatin 20 Mg Tabs (Simvastatin) .Marland Kitchen.. 1po Once Daily 8)  Centrum  Tabs (Multiple Vitamins-Minerals) .... One Tablet By Mouth Once Daily 9)  B Complex  Tabs (B Complex Vitamins) .... One Tablet By Mouth Once Daily 10)  Coq10 100 Mg Caps (Coenzyme Q10) .... One Capsule By Mouth Once Daily 11)  Lisinopril 40 Mg Tabs (Lisinopril) .Marland Kitchen.. 1 By Mouth Once Daily 12)  Hydrochlorothiazide 12.5 Mg Caps (Hydrochlorothiazide) .Marland Kitchen.. 1 By Mouth Once Daily  Current Medications (verified): 1)  Baby Aspirin 81  Mg Chew (Aspirin) .... Once Daily 2)  Levothyroxine Sodium 75 Mcg  Tabs (Levothyroxine Sodium) .... Take 1 By Mouth Qd 3)  Vitamin D 1000 Unit Tabs (Cholecalciferol) .... One Tablet By Mouth Once Daily 4)  Fish Oil 1000 Mg Caps (Omega-3 Fatty Acids) .... Take 1 Capsule By Mouth Daily 5)  Warfarin Sodium 5 Mg Tabs (Warfarin Sodium) .... Use As Directed By Anticoagulation Clinic 6)  Metoprolol Succinate 50 Mg Xr24h-Tab (Metoprolol Succinate) .... Take One Tablet By Mouth Daily 7)  Simvastatin 20 Mg Tabs (Simvastatin) .Marland Kitchen.. 1po Once Daily 8)  Centrum  Tabs (Multiple Vitamins-Minerals) .... One Tablet By Mouth Once Daily 9)  B Complex   Tabs (B Complex Vitamins) .... One Tablet By Mouth Once Daily 10)  Coq10 100 Mg Caps (Coenzyme Q10) .... One Capsule By Mouth Once Daily 11)  Lisinopril 40 Mg Tabs (Lisinopril) .Marland Kitchen.. 1 By Mouth Once Daily 12)  Hydrochlorothiazide 12.5 Mg Caps (Hydrochlorothiazide) .Marland Kitchen.. 1 By Mouth Once Daily  Allergies (verified): No Known Drug Allergies  Past History:  Past Medical History: Last updated: 10/01/2010 Heart Murmur Positive Tb (2002) Glaucoma ABNORMAL CHEST XRAY (ICD-793.1) HOARSENESS (ICD-784.49) HYPERTENSION (ICD-401.9) HYPOTHYROIDISM (ICD-244.9) GOITER, MULTINODULAR (ICD-241.1) PERICARDITIS, CONSTRICTIVE (ICD-423.2) ATRIAL FIBRILLATION, PAROXYSMAL (ICD-427.31) Hyperlipidemia CHF   MD roster:  Card - Dr Dareen Piano - Dr Craige Cotta  Past Surgical History: Last updated: 02/16/2010 Hysterectomy (1992) Pericardial window (2002) s/p radioactive iodine for hyperthyroidism/multinodular goiter  Social History: Last updated: 10/01/2010 Single no children retired - office work Alcohol use-no Drug use-no Patient is a former smoker.  Daily Caffeine Use: 1 daily   Risk Factors: Smoking Status: quit (10/01/2010)  Review of Systems       all otherwise negative per pt -    Physical Exam  General:  alert and overweight-appearing.  Head:  normocephalic and atraumatic.   Eyes:  vision grossly intact, pupils equal, and pupils round.   Ears:  R ear normal and L ear normal.   Nose:  no external deformity and no nasal discharge.   Mouth:  no gingival abnormalities and pharynx pink and moist.   Neck:  supple and no masses.   Lungs:  normal respiratory effort and normal breath sounds.   Heart:  normal rate and regular rhythm.   Extremities:  no edema, no erythema  Skin:  5 cm oval echymotic area right lateral chest wall, marked tender, mild swelilng, no obvious bony abnormality, located mid and ant axillary line area   Impression & Recommendations:  Problem # 1:   CHEST PAIN (ICD-786.50) post traumatic, midl to mod with large echymotic area, cant r/o small rib fx - for films , OTC pain med per pt suchas tylenol as needed (declines other pain med), f/u any worsening symptoms as needed  Orders: T-2 View CXR, Same Day (71020.5TC) T-Ribs Unilateral 2 Views (71100TC)  Problem # 2:  HYPERTENSION (ICD-401.9)  Her updated medication list for this problem includes:    Metoprolol Succinate 50 Mg Xr24h-tab (Metoprolol succinate) .Marland Kitchen... Take one tablet by mouth daily    Lisinopril 40 Mg Tabs (Lisinopril) .Marland Kitchen... 1 by mouth once daily    Hydrochlorothiazide 12.5 Mg Caps (Hydrochlorothiazide) .Marland Kitchen... 1 by mouth once daily  BP today: 110/70 Prior BP: 132/76 (10/01/2010)  Labs Reviewed: K+: 3.8 (07/03/2010) Creat: : 0.8 (07/03/2010)   Chol: 202 (07/03/2010)   HDL: 33.00 (07/03/2010)   LDL: 130 (05/09/2009)  TG: 118.0 (07/03/2010) stable overall by hx and exam, ok to continue meds/tx as is   Problem # 3:  COUMADIN THERAPY (ICD-V58.61) now back on coumadin with approp f/u with coumadin clinic, so PT not needed today,  d/w pt - reassured, not liekly related to the echymosis and not likely to make it worse at this point, ok to Continue all previous medications as before this visit   Complete Medication List: 1)  Baby Aspirin 81 Mg Chew (Aspirin) .... Once daily 2)  Levothyroxine Sodium 75 Mcg Tabs (Levothyroxine sodium) .... Take 1 by mouth qd 3)  Vitamin D 1000 Unit Tabs (Cholecalciferol) .... One tablet by mouth once daily 4)  Fish Oil 1000 Mg Caps (Omega-3 fatty acids) .... Take 1 capsule by mouth daily 5)  Warfarin Sodium 5 Mg Tabs (Warfarin sodium) .... Use as directed by anticoagulation clinic 6)  Metoprolol Succinate 50 Mg Xr24h-tab (Metoprolol succinate) .... Take one tablet by mouth daily 7)  Simvastatin 20 Mg Tabs (Simvastatin) .Marland Kitchen.. 1po once daily 8)  Centrum Tabs (Multiple vitamins-minerals) .... One tablet by mouth once daily 9)  B Complex Tabs (B complex  vitamins) .... One tablet by mouth once daily 10)  Coq10 100 Mg Caps (Coenzyme q10) .... One capsule by mouth once daily 11)  Lisinopril 40 Mg Tabs (Lisinopril) .Marland Kitchen.. 1 by mouth once daily 12)  Hydrochlorothiazide 12.5 Mg Caps (Hydrochlorothiazide) .Marland Kitchen.. 1 by mouth once daily  Patient Instructions: 1)  Please go to Radiology in the basement level for your X-Ray today  2)  Continue all previous medications as before this visit 3)  Check your Blood Pressure regularly. If it is above 140/90: you should make an appointment. 4)  Please schedule a follow-up appointment in September 2012 for CPX with labs   Orders Added: 1)  T-2 View CXR, Same Day [71020.5TC] 2)  T-Ribs Unilateral 2 Views [71100TC] 3)  Est. Patient Level IV [98119]

## 2010-11-22 NOTE — Medication Information (Signed)
Summary: rov/sp   Anticoagulant Therapy  Managed by: Weston Brass, PharmD Referring MD: Nona Dell PCP: Corwin Levins MD Supervising MD: Jens Som MD, Arlys John Indication 1: Atrial Fibrillation (ICD-427.31) Lab Used: [facility] Site: [facility] INR POC 1.6 INR RANGE 2 - 3  Dietary changes: no    Health status changes: no    Bleeding/hemorrhagic complications: no    Recent/future hospitalizations: yes       Details: Had colonoscopy performed last week.  Stopped Coumadin and then restarted on the 18th.  Any changes in medication regimen? no    Recent/future dental: no  Any missed doses?: no       Is patient compliant with meds? yes       Allergies: No Known Drug Allergies  Anticoagulation Management History:      The patient is taking warfarin and comes in today for a routine follow up visit.  Positive risk factors for bleeding include an age of 21 years or older.  The bleeding index is 'intermediate risk'.  Positive CHADS2 values include History of HTN.  Negative CHADS2 values include Age > 12 years old.  The start date was 04/07/2006.  Anticoagulation responsible provider: Jens Som MD, Arlys John.  INR POC: 1.6.  Cuvette Lot#: [id number].  Exp: 11/2011.    Anticoagulation Management Assessment/Plan:      The patient's current anticoagulation dose is Warfarin sodium 5 mg tabs: Use as directed by Anticoagulation Clinic.  The target INR is 2.0-3.0.  The next INR is due 11/29/2010.  Anticoagulation instructions were given to patient.  Results were reviewed/authorized by Weston Brass, PharmD.  She was notified by Linward Headland, PharmD candidate.         Prior Anticoagulation Instructions: INR 3.3  Skip today's dose of Coumadin then resume same dose of 1 tablet every day except 1/2 tablet on Monday, Wednesday and Friday.  Take last dose on 1/12.  Colonoscopy on 1/17.  Restart Coumadin at normal dose when okay with Dr. Arlyce Dice.  Recheck INR 1 week after procedure.   Current Anticoagulation  Instructions: INR 1.6 (INR goal: 2-3)  Take an extra half tablet today (Tuesday).  Resume normal schedule on Wednesday of 1 tablet everyday except 1/2 tablet on Mondays, Wednesdays, and Fridays.  Recheck in 2 weeks.

## 2010-11-22 NOTE — Procedures (Signed)
Summary: Colonoscopy  Patient: [name] Note: All result statuses are Final unless otherwise noted.  Tests: (1) Colonoscopy (COL)   COL Colonoscopy           DONE     [facility]     [address]     [place], [place]  [address]           COLONOSCOPY PROCEDURE REPORT           PATIENT:  Monica Mora, Monica Mora  MR#:  [id number]     BIRTHDATE:  October 31, 1934, 75 yrs. old  GENDER:  female           ENDOSCOPIST:  Barbette Hair. Arlyce Dice, MD     Referred by:           PROCEDURE DATE:  11/06/2010     PROCEDURE:  Diagnostic Colonoscopy     ASA CLASS:  Class II     INDICATIONS:  1) Routine Risk Screening           MEDICATIONS:   Fentanyl 75 mcg IV, Versed 7 mg IV, Benadryl 25 mg     IV           DESCRIPTION OF PROCEDURE:   After the risks benefits and     alternatives of the procedure were thoroughly explained, informed     consent was obtained.  Digital rectal exam was performed and     revealed no abnormalities.   The LB [id number] endoscope     was introduced through the anus and advanced to the cecum, which     was identified by both the appendix and ileocecal valve, without     limitations.  The quality of the prep was excellent, using     MoviPrep.  The instrument was then slowly withdrawn as the colon     was fully examined.     <<PROCEDUREIMAGES>>           FINDINGS:  Moderate diverticulosis was found in the sigmoid to     descending colon segments (see image2).  This was otherwise a     normal examination of the colon (see image3, image5, image8,     image10, and image12).   Retroflexed views in the rectum revealed     no abnormalities.    The time to cecum =  5.50  minutes. The scope     was then withdrawn (time =  6.0  min) from the patient and the     procedure completed.           COMPLICATIONS:  None           ENDOSCOPIC IMPRESSION:     1) Moderate diverticulosis in the sigmoid to descending colon     segments     2) Otherwise normal examination  RECOMMENDATIONS:     1) Continue current colorectal screening recommendations for     "routine risk" patients with a repeat colonoscopy in 10 years.     2) Sedation with MAC for future procedures     3) resume coumadin today           REPEAT EXAM:   10 year(s) Colonoscopy           ______________________________     Barbette Hair. Arlyce Dice, MD           CC:           n.     eSIGNED:   Barbette Hair. Eunice Oldaker at 11/06/2010 03:21 PM  [place], [place], [place] [id number]  Note: An exclamation mark (!) indicates a result that was not dispersed into the flowsheet. Document Creation Date: 11/06/2010 3:21 PM _______________________________________________________________________  (1) Order result status: Final Collection or observation date-time: 11/06/2010 15:17 Requested date-time:  Receipt date-time:  Reported date-time:  Referring Physician:   Ordering Physician: Melvia Heaps [id number]) Specimen Source:  Source: Launa Grill Order Number: [id number] Lab site:   Appended Document: Colonoscopy    Clinical Lists Changes  Observations: Added new observation of COLONNXTDUE: 10/2020 (11/06/2010 16:45)

## 2010-11-22 NOTE — Letter (Signed)
Summary: Anticoagulation/[facility]  Anticoagulation/[facility]   Imported By: Sherian Rein 10/25/2010 09:33:17  _____________________________________________________________________  External Attachment:    Type:   Image     Comment:   External Document

## 2010-11-22 NOTE — Letter (Signed)
Summary: Drug Orders prior to Endoscopic Procedure  Drug Orders prior to Endoscopic Procedure   Imported By: Marylou Mccoy 10/19/2010 17:39:43  _____________________________________________________________________  External Attachment:    Type:   Image     Comment:   External Document

## 2010-11-22 NOTE — Letter (Signed)
Summary: Anticoagulation Modification Letter (OK TO HOLD)  [facility]  [address] [place], [place] [address]   Phone: [phone]  Fax: [phone]    October 01, 2010  Re:    Monica Mora [facility] DOB:    Sep 25, 1935 MRN:    [id number]    Dear [name], MD We have scheduled the above patient for an endoscopic procedure. Our records show that  he/she is on anticoagulation therapy. Please advise as to how long the patient may come off their therapy of coumadin prior to the scheduled procedure(s) on1/17/2012  Please fax back/or route the completed form to Robin  at [phone]   Thank you for your help with this matter.  Sincerely,  Merri Ray CMA Duncan Dull)   Physician Recommendation:  Hold Plavix 7 days prior ________________  Hold Coumadin 5 days prior ____________  Other ______________________________     Appended Document: Anticoagulation Modification Letter Recieved of for pt to hold coumadin per [name]. Will scan approval letter. Pt contacted

## 2010-11-22 NOTE — Letter (Signed)
Summary: [facility] Instructions  [facility]  [address] [place], [place] [address]   Phone: [phone]  Fax: [phone]       MORRISA ALDABA    1935/04/16    MRN: [id number]        Procedure Day /Date:TUESDAY 11/06/2010     Arrival Time:1:30PM     Procedure Time:2:30PM     Location of Procedure:                    X   [facility] (4th Floor)   PREPARATION FOR COLONOSCOPY WITH MOVIPREP   Starting 5 days prior to your procedure 11/01/2010  do not eat nuts, seeds, popcorn, corn, beans, peas,  salads, or any raw vegetables.  Do not take any fiber supplements (e.g. Metamucil, Citrucel, and Benefiber).  THE DAY BEFORE YOUR PROCEDURE         DATE: 11/05/2010  DAY: MONDAY  1.  Drink clear liquids the entire day-NO SOLID FOOD  2.  Do not drink anything colored red or purple.  Avoid juices with pulp.  No orange juice.  3.  Drink at least 64 oz. (8 glasses) of fluid/clear liquids during the day to prevent dehydration and help the prep work efficiently.  CLEAR LIQUIDS INCLUDE: Water Jello Ice Popsicles Tea (sugar ok, no milk/cream) Powdered fruit flavored drinks Coffee (sugar ok, no milk/cream) Gatorade Juice: apple, white grape, white cranberry  Lemonade Clear bullion, consomm, broth Carbonated beverages (any kind) Strained chicken noodle soup Hard Candy                             4.  In the morning, mix first dose of MoviPrep solution:    Empty 1 Pouch A and 1 Pouch B into the disposable container    Add lukewarm drinking water to the top line of the container. Mix to dissolve    Refrigerate (mixed solution should be used within 24 hrs)  5.  Begin drinking the prep at 5:00 p.m. The MoviPrep container is divided by 4 marks.   Every 15 minutes drink the solution down to the next mark (approximately 8 oz) until the full liter is complete.   6.  Follow completed prep with 16 oz of clear liquid of your choice (Nothing red or purple).   Continue to drink clear liquids until bedtime.  7.  Before going to bed, mix second dose of MoviPrep solution:    Empty 1 Pouch A and 1 Pouch B into the disposable container    Add lukewarm drinking water to the top line of the container. Mix to dissolve    Refrigerate  THE DAY OF YOUR PROCEDURE      DATE: 11/06/2010 ONG:EXBMWUX  Beginning at 9:30a.m. (5 hours before procedure):         1. Every 15 minutes, drink the solution down to the next mark (approx 8 oz) until the full liter is complete.  2. Follow completed prep with 16 oz. of clear liquid of your choice.    3. You may drink clear liquids until 12:30PM  (2 HOURS BEFORE PROCEDURE).   MEDICATION INSTRUCTIONS  Unless otherwise instructed, you should take regular prescription medications with a small sip of water   as early as possible the morning of your procedure.    Stop taking Coumadin on  _ _  (5 days before procedure)  You will be contaced by our office prior to  your procedure for directions on holding your Coumadin/Warfarin.  If you do not hear from our office 1 week prior to your scheduled procedure, please call [phone] to discuss.          OTHER INSTRUCTIONS  You will need a responsible adult at least 74 years of age to accompany you and drive you home.   This person must remain in the waiting room during your procedure.  Wear loose fitting clothing that is easily removed.  Leave jewelry and other valuables at home.  However, you may wish to bring a book to read or  an iPod/MP3 player to listen to music as you wait for your procedure to start.  Remove all body piercing jewelry and leave at home.  Total time from sign-in until discharge is approximately 2-3 hours.  You should go home directly after your procedure and rest.  You can resume normal activities the  day after your procedure.  The day of your procedure you should not:   Drive   Make legal decisions   Operate machinery   Drink  alcohol   Return to work  You will receive specific instructions about eating, activities and medications before you leave.    The above instructions have been reviewed and explained to me by   _______________________    I fully understand and can verbalize these instructions _____________________________ Date _________

## 2010-11-22 NOTE — Miscellaneous (Signed)
Summary: BONE DENSITY  Clinical Lists Changes  Orders: Added new Test order of T-Bone Densitometry (77080) - Signed Added new Test order of T-Lumbar Vertebral Assessment (77082) - Signed 

## 2010-11-22 NOTE — Assessment & Plan Note (Signed)
Summary: discuss colon on BT--ch.    History of Present Illness Visit Type: new patient  Primary GI MD: Melvia Heaps MD [facility] Primary Provider: Corwin Levins MD Requesting Provider: na Chief Complaint: Consult colon. Pt denies any GI complaints  History of Present Illness:   Monica Mora is a pleasant 76 year old American female self-referred for colorectal cancer screening.  She has no GI complaints including change of bowel habits, abdominal pain, melena or hematochezia.  She takes Coumadin for atrial fibrillation.   GI Review of Systems      Denies abdominal pain, acid reflux, belching, bloating, chest pain, dysphagia with liquids, dysphagia with solids, heartburn, loss of appetite, nausea, vomiting, vomiting blood, weight loss, and  weight gain.        Denies anal fissure, black tarry stools, change in bowel habit, constipation, diarrhea, diverticulosis, fecal incontinence, heme positive stool, hemorrhoids, irritable bowel syndrome, jaundice, light color stool, liver problems, rectal bleeding, and  rectal pain.    Current Medications (verified): 1)  Baby Aspirin 81 Mg Chew (Aspirin) .... Once Daily 2)  Levothyroxine Sodium 75 Mcg  Tabs (Levothyroxine Sodium) .... Take 1 By Mouth Qd 3)  Vitamin D 1000 Unit Tabs (Cholecalciferol) .... One Tablet By Mouth Once Daily 4)  Micardis Hct 80-25 Mg Tabs (Telmisartan-Hctz) .Marland Kitchen.. 1 By Mouth Once Daily 5)  Fish Oil 1000 Mg Caps (Omega-3 Fatty Acids) .... Take 1 Capsule By Mouth Daily 6)  Warfarin Sodium 5 Mg Tabs (Warfarin Sodium) .... Use As Directed By Anticoagulation Clinic 7)  Metoprolol Succinate 50 Mg Xr24h-Tab (Metoprolol Succinate) .... Take One Tablet By Mouth Daily 8)  Simvastatin 20 Mg Tabs (Simvastatin) .Marland Kitchen.. 1po Once Daily 9)  Centrum  Tabs (Multiple Vitamins-Minerals) .... One Tablet By Mouth Once Daily 10)  B Complex  Tabs (B Complex Vitamins) .... One Tablet By Mouth Once Daily 11)  Coq10 100 Mg Caps (Coenzyme Q10) .... One  Capsule By Mouth Once Daily  Allergies (verified): No Known Drug Allergies  Past History:  Past Medical History: Heart Murmur Positive Tb (2002) Glaucoma ABNORMAL CHEST XRAY (ICD-793.1) HOARSENESS (ICD-784.49) HYPERTENSION (ICD-401.9) HYPOTHYROIDISM (ICD-244.9) GOITER, MULTINODULAR (ICD-241.1) PERICARDITIS, CONSTRICTIVE (ICD-423.2) ATRIAL FIBRILLATION, PAROXYSMAL (ICD-427.31) Hyperlipidemia CHF   MD roster:  Card - Dr Dareen Piano - Dr Craige Cotta  Past Surgical History: Reviewed history from 02/16/2010 and no changes required. Hysterectomy (1992) Pericardial window (2002) s/p radioactive iodine for hyperthyroidism/multinodular goiter  Family History: brother with asthma mother and father with arthritis sister with cervical cancer No FH of Colon Cancer:  Social History: Single no children retired - office work Alcohol use-no Drug use-no Patient is a former smoker.  Daily Caffeine Use: 1 daily   Review of Systems       The patient complains of cough.  The patient denies allergy/sinus, anemia, anxiety-new, arthritis/joint pain, back pain, blood in urine, breast changes/lumps, change in vision, confusion, coughing up blood, depression-new, fainting, fatigue, fever, headaches-new, hearing problems, heart murmur, heart rhythm changes, itching, menstrual pain, muscle pains/cramps, night sweats, nosebleeds, pregnancy symptoms, shortness of breath, skin rash, sleeping problems, sore throat, swelling of feet/legs, swollen lymph glands, thirst - excessive , urination - excessive , urination changes/pain, urine leakage, vision changes, and voice change.         All other systems were reviewed and were negative   Vital Signs:  Patient profile:   76 year old female Height:  67.5 inches Weight:      224 pounds BMI:     34.69 BSA:     2.14 Pulse rate:   68 / minute Pulse rhythm:   regular BP sitting:   132 / 76  (left arm) Cuff size:   large  Vitals  Entered By: Ok Anis CMA (October 01, 2010 1:32 PM)  Physical Exam  Additional Exam:  On physical exam she is a well-developed well-nourished female  skin: anicteric HEENT: normocephalic; PEERLA; no nasal or pharyngeal abnormalities neck: supple nodes: no cervical lymphadenopathy chest: clear to ausculatation and percussion heart: no murmurs, gallops, or rubs abd: soft, nontender; BS normoactive; no abdominal masses, tenderness, organomegaly rectal: deferred ext: no cynanosis, clubbing, edema; there mild chronic venous stasis changes skeletal: no deformities neuro: oriented x 3; no focal abnormalities    Impression & Recommendations:  Problem # 1:  SPECIAL SCREENING FOR MALIGNANT NEOPLASMS COLON (ICD-V76.51)  Plan screening colonoscopy  Orders: Colonoscopy (Colon)  Problem # 2:  COUMADIN THERAPY (ICD-V58.61) Assessment: Comment Only  Orders: Colonoscopy (Colon)  Problem # 3:  GLAUCOMA (ICD-365.9) Assessment: Comment Only  Problem # 4:  ATRIAL FIBRILLATION, PAROXYSMAL (ICD-427.31) Assessment: Comment Only  Patient Instructions: 1)  Copy sent to : Corwin Levins MD 2)  Your colonoscopy is scheduled on 11/06/2010 at 2:30pm 3)  You can pick up your MoviPrep from your pharmacy today 4)  Colonoscopy and Flexible Sigmoidoscopy brochure given.  5)  Conscious Sedation brochure given.  6)  The medication list was reviewed and reconciled.  All changed / newly prescribed medications were explained.  A complete medication list was provided to the patient / caregiver. Prescriptions: MOVIPREP 100 GM  SOLR (PEG-KCL-NACL-NASULF-NA ASC-C) As per prep instructions.  #1 x 0   Entered by:   Merri Ray CMA (AAMA)   Authorized by:   Louis Meckel MD   Signed by:   Merri Ray CMA (AAMA) on 10/01/2010   Method used:   Electronically to        [facility] (retail)       [address]       [place], [place]  [address]       Ph: [phone] or [phone]       Fax:  [phone]   RxID:   [id number]

## 2010-11-22 NOTE — Progress Notes (Signed)
  Phone Note Call from Patient   Caller: Patient Summary of Call: Patient has changed insurance and no longer uses [facility]. Patient requesting prescriptions Lisinopril and HCTZ sent 10/30/2010 to be resent to [facility]. Initial call taken by: Robin Ewing CMA (AAMA),  October 30, 2010 11:51 AM    Prescriptions: HYDROCHLOROTHIAZIDE 12.5 MG CAPS (HYDROCHLOROTHIAZIDE) 1 by mouth once daily  #90 x 3   Entered by:   Scharlene Gloss CMA (AAMA)   Authorized by:   Corwin Levins MD   Signed by:   Scharlene Gloss CMA (AAMA) on 10/30/2010   Method used:   Faxed to ...       [facility] (retail)       [address]       [place], [place]  [address]       Ph: [phone] or [phone]       Fax: [phone]   RxID:   [id number] LISINOPRIL 40 MG TABS (LISINOPRIL) 1 by mouth once daily  #90 x 3   Entered by:   Zella Ball Ewing CMA (AAMA)   Authorized by:   Corwin Levins MD   Signed by:   Scharlene Gloss CMA (AAMA) on 10/30/2010   Method used:   Faxed to ...       [facility] (retail)       [address]       [place], [place]  [address]       Ph: [phone] or [phone]       Fax: [phone]   RxID:   [id number]

## 2010-11-22 NOTE — Progress Notes (Signed)
Summary: Triage   Phone Note Call from Patient Call back at Home Phone [phone]   Caller: Patient Call For: Dr. Arlyce Dice Reason for Call: Talk to Nurse Summary of Call: After having her COL she got home and fell and would like to discuss Initial call taken by: Karna Christmas,  November 09, 2010 12:32 PM  Follow-up for Phone Call        Spoke with patient and she states that she fell the night that she had her colon and that she has had some discomfort in her side since the fall. Wanted to have some xrays done and wondered if we would order them or if she needed to call Dr. Jonny Ruiz. Instructed patient to call Dr. Jonny Ruiz. She verbalized understanding. Follow-up by: Selinda Michaels RN,  November 09, 2010 1:14 PM

## 2010-11-22 NOTE — Progress Notes (Signed)
Summary: med question  Phone Note Call from Patient Call back at [facility] Phone [phone]   Caller: Patient Reason for Call: Talk to Nurse Summary of Call: unable to afford MICARDIS HCT 80-12.5 MG TABS (TELMISARTAN-HCTZ), is there anything else she can take that is cheaper? Initial call taken by: Migdalia Dk,  October 30, 2010 10:45 AM  Follow-up for Phone Call        ok to d/c micardis hct  start lisinopril 40 once daily AND HCTZ 12.5 once daily   to robin to handle Follow-up by: Corwin Levins MD,  October 30, 2010 10:55 AM  Additional Follow-up for Phone Call Additional follow up Details #1::        called patient left message and  informed have sent new prescription to pharmacy. Additional Follow-up by: Robin Ewing CMA (AAMA),  October 30, 2010 11:23 AM    New/Updated Medications: LISINOPRIL 40 MG TABS (LISINOPRIL) 1 by mouth once daily HYDROCHLOROTHIAZIDE 12.5 MG CAPS (HYDROCHLOROTHIAZIDE) 1 by mouth once daily Prescriptions: HYDROCHLOROTHIAZIDE 12.5 MG CAPS (HYDROCHLOROTHIAZIDE) 1 by mouth once daily  #90 x 3   Entered by:   Scharlene Gloss CMA (AAMA)   Authorized by:   Corwin Levins MD   Signed by:   Scharlene Gloss CMA (AAMA) on 10/30/2010   Method used:   Faxed to ...       [facility]* (retail)             ,          Ph: [phone]       Fax: [phone]   RxID:   [id number] LISINOPRIL 40 MG TABS (LISINOPRIL) 1 by mouth once daily  #90 x 3   Entered by:   Zella Ball Ewing CMA (AAMA)   Authorized by:   Corwin Levins MD   Signed by:   Scharlene Gloss CMA (AAMA) on 10/30/2010   Method used:   Faxed to ...       [facility]* (retail)             ,          Ph: [phone]       Fax: [phone]   RxID:   [id number]

## 2010-11-22 NOTE — Letter (Signed)
Summary: Results Letter  [facility]  [address] [place], [place] [address]   Phone: [phone]  Fax: [phone]        October 01, 2010 MRN: [id number]    [facility] [address] [place], [place]  [address]    Dear Ms. PEER,  It is my pleasure to have treated you recently as a new patient in my office. I appreciate your confidence and the opportunity to participate in your care.  Since I do have a busy inpatient endoscopy schedule and office schedule, my office hours vary weekly. I am, however, available for emergency calls everyday through my office. If I am not available for an urgent office appointment, another one of our gastroenterologist will be able to assist you.  My well-trained staff are prepared to help you at all times. For emergencies after office hours, a physician from our Gastroenterology section is always available through my 24 hour answering service  Once again I welcome you as a new patient and I look forward to a happy and healthy relationship             Sincerely,  Louis Meckel MD  This letter has been electronically signed by your physician.  Appended Document: Results Letter letter mailed

## 2010-11-28 ENCOUNTER — Telehealth: Payer: Self-pay | Admitting: Internal Medicine

## 2010-11-28 NOTE — Progress Notes (Signed)
Summary: pt needs refill and has new Pharm   Phone Note Refill Request Call back at Home Phone [phone] Message from:  Patient  Refills Requested: Medication #1:  WARFARIN SODIUM 5 MG TABS Use as directed by Anticoagulation Clinic pt is with Rx solution now and needs refill called in .Marland KitchenMarland KitchenID # from [facility] [id number]  Initial call taken by: Omer Jack,  November 21, 2010 12:22 PM    Prescriptions: WARFARIN SODIUM 5 MG TABS (WARFARIN SODIUM) Use as directed by Anticoagulation Clinic  #90 Tablet x 3   Entered by:   Bethena Midget, RN, BSN   Authorized by:   Sherrill Raring, MD, [facility]   Signed by:   Bethena Midget, RN, BSN on 11/21/2010   Method used:   Electronically to        [facility] (mail-order)       [address], [place]  [address]       Ph: [phone]       Fax: [phone]   RxID:   [id number]

## 2010-11-28 NOTE — Progress Notes (Signed)
Summary: pt needs a refill   Phone Note Refill Request Call back at Home Phone [phone] Message from:  Patient  Refills Requested: Medication #1:  METOPROLOL SUCCINATE 50 MG XR24H-TAB Take one tablet by mouth daily pt is with Rx solution now and needs refill called in .Marland KitchenMarland KitchenID # from [facility] [id number]  Initial call taken by: Omer Jack,  November 21, 2010 12:13 PM    Prescriptions: METOPROLOL SUCCINATE 50 MG XR24H-TAB (METOPROLOL SUCCINATE) Take one tablet by mouth daily  #90 Tablet x 3   Entered by:   Burnett Kanaris, CNA   Authorized by:   Sherrill Raring, MD, [facility]   Signed by:   Burnett Kanaris, CNA on 11/21/2010   Method used:   Electronically to        [facility] (mail-order)       [address], [place]  [address]       Ph: [phone]       Fax: [phone]   RxID:   [id number]

## 2010-11-29 ENCOUNTER — Encounter (INDEPENDENT_AMBULATORY_CARE_PROVIDER_SITE_OTHER): Payer: Medicare Other

## 2010-11-29 ENCOUNTER — Encounter: Payer: Self-pay | Admitting: Internal Medicine

## 2010-11-29 DIAGNOSIS — Z7901 Long term (current) use of anticoagulants: Secondary | ICD-10-CM

## 2010-11-29 DIAGNOSIS — I4891 Unspecified atrial fibrillation: Secondary | ICD-10-CM

## 2010-11-29 LAB — CONVERTED CEMR LAB: POC INR: 2.5

## 2010-12-03 ENCOUNTER — Encounter: Payer: Self-pay | Admitting: Internal Medicine

## 2010-12-06 NOTE — Medication Information (Signed)
Summary: Coumadin Clinic   Anticoagulant Therapy  Managed by: Weston Brass, PharmD Referring MD: Nona Dell PCP: Corwin Levins MD Supervising MD: Johney Frame MD, Fayrene Fearing Indication 1: Atrial Fibrillation (ICD-427.31) Lab Used: [facility] Site: [facility] INR POC 2.5 INR RANGE 2 - 3  Dietary changes: no    Health status changes: no    Bleeding/hemorrhagic complications: no    Recent/future hospitalizations: no    Any changes in medication regimen? no    Recent/future dental: no  Any missed doses?: no       Is patient compliant with meds? yes       Allergies: No Known Drug Allergies  Anticoagulation Management History:      The patient is taking warfarin and comes in today for a routine follow up visit.  Positive risk factors for bleeding include an age of 74 years or older.  The bleeding index is 'intermediate risk'.  Positive CHADS2 values include History of HTN.  Negative CHADS2 values include Age > 66 years old.  The start date was 04/07/2006.  Anticoagulation responsible provider: Layanna Charo MD, Fayrene Fearing.  INR POC: 2.5.  Cuvette Lot#: [id number].  Exp: 10/2011.    Anticoagulation Management Assessment/Plan:      The patient's current anticoagulation dose is Warfarin sodium 5 mg tabs: Use as directed by Anticoagulation Clinic.  The target INR is 2.0-3.0.  The next INR is due 12/27/2010.  Anticoagulation instructions were given to patient.  Results were reviewed/authorized by Weston Brass, PharmD.  She was notified by Margot Chimes PharmD Candidate.         Prior Anticoagulation Instructions: INR 1.6 (INR goal: 2-3)  Take an extra half tablet today (Tuesday).  Resume normal schedule on Wednesday of 1 tablet everyday except 1/2 tablet on Mondays, Wednesdays, and Fridays.  Recheck in 2 weeks.  Current Anticoagulation Instructions: INR 2.5   Continue current coumadin dose of 1 tablet everyday except 1/2 tablet on Mondays, Wednesdays, and Fridays.  Recheck in 4 weeks.

## 2010-12-12 NOTE — Miscellaneous (Signed)
  Clinical Lists Changes  Medications: Removed medication of METOPROLOL SUCCINATE 50 MG XR24H-TAB (METOPROLOL SUCCINATE) Take one tablet by mouth daily Added new medication of METOPROLOL TARTRATE 25 MG TABS (METOPROLOL TARTRATE) 1 tab every 12 hours - Signed Rx of METOPROLOL TARTRATE 25 MG TABS (METOPROLOL TARTRATE) 1 tab every 12 hours;  #180 x 3;  Signed;  Entered by: Layne Benton, RN, BSN;  Authorized by: Sherrill Raring, MD, [facility];  Method used: Electronically to [facility] [address] [place], [place]  [address], Ph: [phone], Fax: [phone]    Prescriptions: METOPROLOL TARTRATE 25 MG TABS (METOPROLOL TARTRATE) 1 tab every 12 hours  #180 x 3   Entered by:   Layne Benton, RN, BSN   Authorized by:   Sherrill Raring, MD, [facility]   Signed by:   Layne Benton, RN, BSN on 12/03/2010   Method used:   Electronically to        [facility] (mail-order)       [address]   [address]       Ph: [phone]       Fax: [phone]   RxID:   [id number]

## 2010-12-12 NOTE — Progress Notes (Signed)
Summary: medication question--awaiting dr Tenny Craw   Phone Note Call from Patient Call back at Home Phone [phone]   Caller: Patient Reason for Call: Talk to Nurse, Talk to Doctor Summary of Call: pt is on metoprolol succinate and Pharm told her metoprolol titrate would be cheaper for her so would like to know what Dr. Tenny Craw thinks Initial call taken by: Omer Jack,  November 28, 2010 12:30 PM  Follow-up for Phone Call        Pt. was told by the pharmacist, that metoprolol titrate is cheaper than metoprolol succinate. Pt  takes Metoprolol succinate 50 mg once a day. She would like to know what  Dr. Tenny Craw  thinks. Pt. said if Dr. Tenny Craw wants for her to continue taken Metoprolol Succinate, she will. Follow-up by: Ollen Gross, RN, BSN,  November 28, 2010 12:47 PM  Additional Follow-up for Phone Call Additional follow up Details #1::        She can take tartrate 25 two times a day. Additional Follow-up by: Sherrill Raring, MD, [facility],  December 03, 2010 12:26 PM     Appended Document: medication question--awaiting dr Clydia Llano patient and she is aware.

## 2010-12-25 ENCOUNTER — Encounter: Payer: Self-pay | Admitting: Internal Medicine

## 2010-12-25 DIAGNOSIS — I4891 Unspecified atrial fibrillation: Secondary | ICD-10-CM

## 2010-12-27 ENCOUNTER — Encounter (INDEPENDENT_AMBULATORY_CARE_PROVIDER_SITE_OTHER): Payer: Medicare Other

## 2010-12-27 ENCOUNTER — Encounter: Payer: Self-pay | Admitting: Internal Medicine

## 2010-12-27 DIAGNOSIS — Z7901 Long term (current) use of anticoagulants: Secondary | ICD-10-CM

## 2010-12-27 DIAGNOSIS — I4891 Unspecified atrial fibrillation: Secondary | ICD-10-CM

## 2011-01-01 NOTE — Medication Information (Addendum)
Summary: rov/sp   Anticoagulant Therapy  Managed by: Windell Hummingbird, RN Referring MD: Nona Dell PCP: Corwin Levins MD Supervising MD: Ladona Ridgel MD, Sharlot Gowda Indication 1: Atrial Fibrillation (ICD-427.31) Lab Used: [facility] Site: [facility] INR POC 2.5 INR RANGE 2 - 3  Dietary changes: no    Health status changes: no    Bleeding/hemorrhagic complications: no    Recent/future hospitalizations: no    Any changes in medication regimen? no    Recent/future dental: no  Any missed doses?: no       Is patient compliant with meds? yes       Allergies: No Known Drug Allergies  Anticoagulation Management History:      The patient is taking warfarin and comes in today for a routine follow up visit.  Positive risk factors for bleeding include an age of 74 years or older.  The bleeding index is 'intermediate risk'.  Positive CHADS2 values include History of HTN.  Negative CHADS2 values include Age > 74 years old.  The start date was 04/07/2006.  Anticoagulation responsible provider: Ladona Ridgel MD, Sharlot Gowda.  INR POC: 2.5.  Cuvette Lot#: [id number].  Exp: 10/2011.    Anticoagulation Management Assessment/Plan:      The patient's current anticoagulation dose is Warfarin sodium 5 mg tabs: Use as directed by Anticoagulation Clinic.  The target INR is 2.0-3.0.  The next INR is due 01/24/2011.  Anticoagulation instructions were given to patient.  Results were reviewed/authorized by Windell Hummingbird, RN.  She was notified by Windell Hummingbird, RN.         Prior Anticoagulation Instructions: INR 2.5   Continue current coumadin dose of 1 tablet everyday except 1/2 tablet on Mondays, Wednesdays, and Fridays.  Recheck in 4 weeks.    Current Anticoagulation Instructions: INR 2.5 Continue taking 1 tablet every day, except take 1/2 tablet on Mondays, Wednesdays, and Fridays. Recheck in 4 weeks.

## 2011-01-11 ENCOUNTER — Encounter: Payer: Self-pay | Admitting: *Deleted

## 2011-01-24 ENCOUNTER — Ambulatory Visit (INDEPENDENT_AMBULATORY_CARE_PROVIDER_SITE_OTHER): Payer: Medicare Other | Admitting: Internal Medicine

## 2011-01-24 ENCOUNTER — Ambulatory Visit (INDEPENDENT_AMBULATORY_CARE_PROVIDER_SITE_OTHER): Payer: Medicare Other | Admitting: *Deleted

## 2011-01-24 DIAGNOSIS — I10 Essential (primary) hypertension: Secondary | ICD-10-CM

## 2011-01-24 DIAGNOSIS — I311 Chronic constrictive pericarditis: Secondary | ICD-10-CM

## 2011-01-24 DIAGNOSIS — E7800 Pure hypercholesterolemia, unspecified: Secondary | ICD-10-CM

## 2011-01-24 DIAGNOSIS — E785 Hyperlipidemia, unspecified: Secondary | ICD-10-CM

## 2011-01-24 DIAGNOSIS — Z7901 Long term (current) use of anticoagulants: Secondary | ICD-10-CM | POA: Insufficient documentation

## 2011-01-24 DIAGNOSIS — I4891 Unspecified atrial fibrillation: Secondary | ICD-10-CM

## 2011-01-24 LAB — POCT INR: INR: 2.9

## 2011-01-24 MED ORDER — ROSUVASTATIN CALCIUM 5 MG PO TABS: ORAL_TABLET | ORAL | Status: DC

## 2011-01-24 NOTE — Progress Notes (Signed)
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  There are no records available..  I last saw her in April  of last year. Since see she notes no SOB.  No chest pains.  She does not walk regularly.   Trying to lose wt. dsssssssssssssssssssssssssssssssssssssssssssssssssssssssssssssssssssssssssssssssssssssssssss  Allergies not on file  Current Outpatient Prescriptions  Medication Sig Dispense Refill  . aspirin 81 MG tablet Take 81 mg by mouth daily.        Marland Kitchen b complex vitamins tablet Take 1 tablet by mouth daily.        . Cholecalciferol (VITAMIN D) 1000 UNITS capsule Take 1,000 Units by mouth daily.        . Coenzyme Q10 (COQ10) 100 MG CAPS Take 100 mg by mouth daily.        . fish oil-omega-3 fatty acids 1000 MG capsule Take 1 g by mouth daily.        . hydrochlorothiazide (,MICROZIDE/HYDRODIURIL,) 12.5 MG capsule Take 12.5 mg by mouth daily.        Marland Kitchen levothyroxine (SYNTHROID, LEVOTHROID) 75 MCG tablet Take 75 mcg by mouth daily.        Marland Kitchen lisinopril (PRINIVIL,ZESTRIL) 40 MG tablet Take 40 mg by mouth daily.        . metoprolol tartrate (LOPRESSOR) 25 MG tablet Take 25 mg by mouth 2 (two) times daily.        . Multiple Vitamins-Minerals (CENTRUM) tablet Take 1 tablet by mouth daily.        . simvastatin (ZOCOR) 20 MG tablet Take 20 mg by mouth at bedtime.        Marland Kitchen warfarin (COUMADIN) 5 MG tablet Take by mouth as directed.          Past Medical History  Diagnosis Date  . Heart murmur   . TB (tuberculosis)     positive  . Abnormal chest x-ray   . Other voice and resonance disorders   . Glaucoma   . Unspecified essential hypertension   . Unspecified hypothyroidism   . Nontoxic multinodular goiter   . Constrictive pericarditis   . Atrial fibrillation   . Hyperlipidemia   . CHF (congestive heart failure)   . H/O: hysterectomy 1992    Past Surgical History  Procedure  Date  . Pericardial window 2002    Family History  Problem Relation Age of Onset  . Asthma Brother   . Arthritis Mother   . Arthritis Father   . Cervical cancer Sister     History   Social History  . Marital Status: Single    Spouse Name: N/A    Number of Children: 0  . Years of Education: N/A   Occupational History  . retired     office work   Social History Main Topics  . Smoking status: Former Games developer  . Smokeless tobacco: Not on file  . Alcohol Use: No  . Drug Use: No  . Sexually Active: Not on file   Other Topics Concern  . Not on file   Social History Narrative  . No narrative on file    Review of Systems:  All systems reviewed.  They are negative to the above problem except as previously stated.  Vital Signs: BP 141/77  Pulse 56  Ht 5\' 7"  (1.702 m)  Wt 221 lb (100.245 kg)  BMI 34.61 kg/m2  Physical Exam  HEENT:  Normocephalic, atraumatic. EOMI, PERRLA.  Neck: JVP is normal. No thyromegaly. No bruits.  Lungs: clear to auscultation. No rales no wheezes.  Heart: Regular rate and rhythm. Normal S1, S2. No S3.   No significant murmurs. PMI not displaced.  Abdomen:  Supple, nontender. Normal bowel sounds. No masses. No hepatomegaly.  Extremities:   Good distal pulses throughout. No lower extremity edema.  Musculoskeletal :moving all extremities.  Neuro:   alert and oriented x3.  CN II-XII grossly intact.  EKG:  SB.  Nonspecific ST T wave changes.  Assessment and Plan:

## 2011-01-24 NOTE — Assessment & Plan Note (Signed)
Patient doing well.  No SOB.  Exam is unremarkable.  No changes.  Stay active, increase activity.

## 2011-01-24 NOTE — Assessment & Plan Note (Signed)
Fair control.  Follow. 

## 2011-01-24 NOTE — Assessment & Plan Note (Signed)
Patient did not tolerate Zocor due to achiness.  I will try Crestor 2.5.  F/U labs in 8 wks.

## 2011-01-24 NOTE — Patient Instructions (Addendum)
Your physician recommends that you schedule a follow-up appointment in:  February 2013 with Dr. Tenny Craw Your physician has recommended you make the following change in your medication: Start Crestor 2.5 mg (half of 5 mg tablet) by mouth daily.  Call and let us know how you are doing with this.  If you can take it we will need to check lab work in 8 weeks.

## 2011-01-24 NOTE — Assessment & Plan Note (Signed)
Curr in SR.  Keep on coumadin.

## 2011-02-21 ENCOUNTER — Ambulatory Visit (INDEPENDENT_AMBULATORY_CARE_PROVIDER_SITE_OTHER): Payer: Medicare Other | Admitting: *Deleted

## 2011-02-21 DIAGNOSIS — I4891 Unspecified atrial fibrillation: Secondary | ICD-10-CM

## 2011-02-21 LAB — POCT INR: INR: 3.9

## 2011-02-22 ENCOUNTER — Encounter: Payer: Self-pay | Admitting: *Deleted

## 2011-02-22 NOTE — Telephone Encounter (Signed)
This encounter was created in error - please disregard.

## 2011-03-04 ENCOUNTER — Other Ambulatory Visit: Payer: Self-pay | Admitting: Endocrinology

## 2011-03-05 NOTE — Assessment & Plan Note (Signed)
[facility]                             PULMONARY OFFICE NOTE   Monica Mora, Monica Mora                    MRN:          [id number]  DATE:07/30/2007                            DOB:          Jun 05, 1935    I saw Monica Mora today in followup for her abnormal CT scan of the  chest.   Since her last visit, she had undergone a PPD which was negative.   She also had undergone a PET scan which showed equivocal uptake in the  lesion in the right lower lobe.  There is no other significant uptake  except for some increased uptake in her thyroid gland which would be  consistent with her previous history of thyroid disease.  She says she  is reasonably stable with her symptom status.  I have reviewed the  results of her PET scan in conjunction with what we have seen on her CT  scan.   I had advised her that the there are several options to pursuing further  evaluation of this.  First is trying to obtain tissue sampling, with the  best initially approach being with bronchoscopy.  The second option  would be to have radiographic follow-up.   After careful deliberation, Monica Mora has decided to pursue  radiographic followup.  We will therefore arrange for her to undergo a  CT scan of the chest without contrast some time in January 2009.  I have  advised her, however, that if there is any significant change on the  appearance of the CT scan, that she would need to reconsider undergoing  tissue sampling.  I have also advised her that if she were to notice any  worsening of her symptoms such as chest pain, shortness of breath,  coughing, sputum production, hemoptysis, fevers, sweats, or weight loss,  that she should call me sooner, so we could pursue interventions  earlier.   I will follow up with her after I have a chance to review her followup  chest CT.     Coralyn Helling, MD  Electronically Signed    VS/MedQ  DD: 07/30/2007  DT: 07/30/2007  Job #:  [id number]   cc:   Corwin Levins, MD  Jonelle Sidle, MD  Cleophas Dunker Everardo All, MD

## 2011-03-05 NOTE — Assessment & Plan Note (Signed)
[facility]                            CARDIOLOGY OFFICE NOTE   [name], [name]                    MRN:          [id number]  DATE:08/28/2007                            DOB:          05-11-1935    PRIMARY CARE PHYSICIAN:  Dr. Oliver Barre.   ENDOCRINOLOGIST:  Dr. Romero Belling.   PULMONOLOGIST:  Dr. Coralyn Helling.   REASON FOR VISIT:  Follow up atrial fibrillation.   HISTORY OF PRESENT ILLNESS:  [name] was seen back in February. She  is doing well without any palpitations and is in normal sinus rhythm  today. Her electrocardiogram shows sinus bradycardia with non-specific T-  wave changes. Her last tracing showed atrial fibrillation. She continues  on Coumadin without any bleeding problems. Her medicines are outlined  below. She is not reporting any problems with chest pain,  breathlessness, or cough. She does state that she underwent radioactive  iodine treatment for hypothyroidism since I last saw her.   ALLERGIES:  No known drug allergies.   CURRENT MEDICATIONS:  1. Aspirin 81 mg daily.  2. Toprol XL 50 mg daily.  3. Coumadin as directed by the Coumadin clinic.  4. Levothyroxine 75 mcg daily.  5. Centrum Silver.  6. Vitamin D.  7. Benicar HCT 20/12.5 mg daily.   REVIEW OF SYSTEMS:  As described in the history of present illness.   PHYSICAL EXAMINATION:  VITAL SIGNS:  Blood pressure 129/78, heart rate  60 and regular, weight 217 pounds.  GENERAL:  The patient is comfortable and in no acute distress.  NECK:  No elevated jugular venous pressure, no loud bruits, no  thyromegaly is noted.  LUNGS:  Clear without labored breathing.  CARDIAC:  Regular rate and rhythm with no S3 gallop. No pericardial rub.  ABDOMEN:  Soft.  EXTREMITIES:  No pitting edema.   IMPRESSION/RECOMMENDATIONS:  1. Paroxysmal atrial fibrillation, presently in normal sinus rhythm.      We will still plan to continue with anticoagulation and beta      blocker  therapy. Hopefully, her rhythm will settle down now that      her hyperthyroidism has been treated. I will see her back over the      next six months.  2. History of constrictive pericarditis with effusion in the past      status post pericardial window. The patient is stable      symptomatically.     Jonelle Sidle, MD  Electronically Signed    SGM/MedQ  DD: 08/28/2007  DT: 08/29/2007  Job #: [id number]   cc:   Corwin Levins, MD  Cleophas Dunker Everardo All, MD  Coralyn Helling, MD

## 2011-03-05 NOTE — Assessment & Plan Note (Signed)
[facility]                             PULMONARY OFFICE NOTE   CHERONDA, ERCK                    MRN:          [id number]  DATE:07/23/2007                            DOB:          16-Mar-1935    I met Monica Mora today for evaluation of her abnormal chest x-ray.   She had a chest x-ray done on April 01, 2007 because she was having  problems with hoarseness, and it was noted that there was a concern for  a left lower lobe density and 52-month followup was recommended.  She  then had a chest x-ray done on June 30, 2007, which again showed  concern for a left lower lobe density and, as a result, she was  scheduled for a CT scan of the chest with contrast, which she had done  on July 14, 2007.  This showed calcified mediastinal and hilar  adenopathy, as well as a 1.2x2.6 superior segment right lower lobe  lesion, but there was no definite left lower lobe density and what was  seen on chest x-ray probably mostly represented scarring.  She has had a  previous CT scan of her chest done May 01, 2006, which again showed the  superior segment right lower lobe lesion measuring 2.5x1x0.9 cm and, per  report, this was compared to a CT scan of her chest done from November 06, 2004 at [facility], which showed similar appearance to the  lesion.  Ms. Dullea denies any symptoms of chest pain, palpitations,  dyspnea, coughing, sputum production, or hemoptysis.  She has not had  any recent fevers, sweats, chills, or weight loss.  She has not had any  sinus problems.  She has not noticed any problems with blurred vision or  headaches.  She did have a problem with hoarseness, but this appears to  be somewhat better.  She has not had any problems with abdominal pain,  nausea, vomiting, or diarrhea.  She also denies any skin rashes or leg  swelling, or joint swelling.  She says that she had an aunt who had  tuberculosis when she was a child.  She says she  was last checked for  PPD about 5 years ago in [place], and from what she can remember,  this was negative.  She has not had any significant occupational  exposures.  There is no significant travel history.  She has not had any  significant exposure to animals, pets, or birds.  She has not had any  recent sick exposures.   PAST MEDICAL HISTORY:  1. Significant for multinodular goiter status post iodine 131      ablation.  2. Hypothyroidism.  3. Atrial fibrillation.  4. Constrictive pericarditis status post pericardial window in [place] in June 2002.  5. Hypertension.  6. Total abdominal hysterectomy with oophorectomy.   The patient has no known drug allergies.   CURRENT MEDICATIONS:  1. Aspirin 81 mg daily.  2. Toprol XL 50 mg daily.  3. Benicar 20 mg daily.  4. Coumadin as directed.  5. Levothyroxine 75 mcg  daily.  6. Centrum Silver once daily.  7. Vitamin D once daily.   FAMILY HISTORY:  Significant for a brother with asthma.  Her mother and  father had arthritis.  She had a sister with cervical cancer.   SOCIAL HISTORY:  She is single.  She has no children.  She quit smoking  in 1980.  Used to smoke 2 to 3 cigarettes a day until the age of 7.  She quit drinking alcohol in 1980.  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   REVIEW OF SYSTEMS:  Unremarkable, except as stated above.   PHYSICAL EXAM:  She is 220 pounds, temperature 97.8, blood pressure  116/80, heart rate is 59, oxygen saturation is 100% on room air.  HEENT:  Pupils are reactive.  There is no sinus tenderness.  No nasal  discharge.  No oral lesions.  No lymphadenopathy.  No thyromegaly.  HEART:  S1, S2.  CHEST:  There was no wheezing or rales.  ABDOMEN:  Soft, nontender, positive bowel sounds.  EXTREMITIES:  No edema, cyanosis, or clubbing.  NEUROLOGIC:  No focal deficits were appreciated.   Chest  x-ray and CT scan findings as stated above.   IMPRESSION:  Superior segment right lower lobe lesion as seen best on CT  scan of the chest.  This has had a slight increase in size compared to  previous imaging studies from 2007 and 2006.  While it is less likely  that this would be concerning for a malignant lesion, this is still a  possibility.  To further evaluate this, I will have her undergo a PET  scan.  Depending upon the results of this, she may need to have tissue  sampling, and the best mode for this will be determined after review of  her PET scan.  In addition, she does have a previous exposure to  tuberculosis, and therefore, I would like for her to undergo an  additional PPD test.  Depending upon the results of this, this may have  bearing upon what further interventions would be necessary for her.   I will follow up with her within the next week, after I have a chance to  review her PPD and her PET scan results.     Coralyn Helling, MD  Electronically Signed    VS/MedQ  DD: 07/23/2007  DT: 07/23/2007  Job #: [id number]   cc:   Corwin Levins, MD  Jonelle Sidle, MD  Cleophas Dunker Everardo All, MD

## 2011-03-05 NOTE — Assessment & Plan Note (Signed)
[facility]                            CARDIOLOGY OFFICE NOTE   Tiarra, Monica Mora                    MRN:          [id number]  DATE:07/15/2008                            DOB:          05-May-1935    IDENTIFICATION:  Monica Mora is a 76 year old woman.  She was previously  followed by Dr. Nona Dell.  She has a history of constrictive  pericarditis and paroxysmal atrial fibrillation.  She was last seen in  November 2008.   In the interval, she has been feeling good.  She works out twice a week  at the [employer] with the senior citizens, notes no change in her  ability to do this, no chest pressure, no shortness of breath.  She is  not in an organized activity the other days of a week, walks some, but  not a lot.   CURRENT MEDICATIONS:  1. Aspirin 81 mg.  2. Toprol-XL 50.  3. Coumadin 5.  4. Synthroid 75 mcg.  5. Centrum Silver.  6. Vitamin D.  7. Benicar and hydrochlorothiazide 20/12.5.   PHYSICAL EXAMINATION:  GENERAL:  The patient is in no distress at rest.  VITAL SIGNS:  Blood pressure 135/67, pulse is 60 and regular, weight 225  up 8 pounds from previous.  LUNGS:  Clear.  NECK:  No bruits.  CARDIAC:  Regular rate and rhythm, S1 and S2.  No S3.  No significant  murmurs.  ABDOMEN:  Benign.  EXTREMITIES:  No edema.   A 12-lead EKG normal sinus rhythm, 57 beats per minute.   IMPRESSION:  1. Paroxysmal atrial fibrillation.  She should remain on Coumadin and      Toprol.  2. Dyslipidemia.  Last lipid panel in 2008.  There was actually one      from 2009, her LDL was in the 150 range but in 2008, LDL was 166.      Her HDL was in the 30s, had been 37.  I would recommend repeating a      lipoma today.  She needs to make some changes in her diet.  I will      refer her to dietary.  She had been on a statin in the past because      her LDL at one point was much better at 60.  She will make the      dietary changes, and we will check  a LipoMet in 6 months' time.   Check TSH today with a history of hypothyroidism.     Pricilla Riffle, MD, [facility]  Electronically Signed    PVR/MedQ  DD: 07/15/2008  DT: 07/16/2008  Job #: [id number]   cc:   Corwin Levins, MD  Cleophas Dunker Everardo All, MD

## 2011-03-05 NOTE — Consult Note (Signed)
[facility]                          ENDOCRINOLOGY CONSULTATION   Monica Mora, Monica Mora                    MRN:          [id number]  DATE:02/19/2007                            DOB:          Oct 28, 1934    REASON FOR VISIT:  Follow up thyroid.   HISTORY OF PRESENT ILLNESS:  A 76 year old woman, who is now two months  status post iodine-131 therapy for her hyperthyroidism.  She feels well,  except for some hoarseness.   PAST MEDICAL HISTORY:  Atrial fibrillation for which she takes Toprol  for rate control.   REVIEW OF SYSTEMS:  She has gained a few pounds since her last visit.   PHYSICAL EXAMINATION:  Blood pressure 128/66, heart rate 53, temperature  is 97.3, the weight is 204.  GENERAL:  No distress.  NECK:  Three times normal size, multinodular goiter, larger on the left.   LABORATORY STUDIES:  On Feb 19, 2007, free T4 0.8, TSH 0.03.   IMPRESSION:  1. Status post iodine-131 therapy for hyperthyroidism, due to a      multinodular goiter.  2. History of atrial fibrillation.   PLAN:  1. Return in a month.  I have told the patient that the radioactive      iodine needs more time to work.  2. This patient should have a followup appointment with Dr. Diona Browner      because, as her thyroid function studies improve, this may uncover      the need to reduce her Toprol dosage.     Sean A. Everardo All, MD  Electronically Signed    SAE/MedQ  DD: 02/20/2007  DT: 02/20/2007  Job #: [id number]   cc:   Jonelle Sidle, MD

## 2011-03-08 NOTE — Assessment & Plan Note (Signed)
[facility]                              CARDIOLOGY OFFICE NOTE   Monica Mora, Monica Mora                      MRN:          [id number]  DATE:  05/01/2006                              DOB:      07/30/1935    PRIMARY CARE PHYSICIAN:  Dr. Oliver Barre   REASON FOR VISIT:  Follow-up atrial fibrillation and echocardiogram.   HISTORY OF PRESENT ILLNESS:  I saw Monica Mora back in June.  Her history is  detailed in my previous note.  She states that she has overall done fairly  well.  Her lower extremity has improved following change from Lotrel to  benazepril and now with the addition of hydrochlorothiazide by Dr. Jonny Ruiz.  Her electrocardiogram today shows atrial fibrillation at 103 beats per  minute, although her heart rate comes down to the 90s at rest.  She does not  sense any specific palpitations, chest pain, or limiting dyspnea.  She had a  follow-up echocardiogram done in late June demonstrating normal left  ventricular systolic function, an ejection fraction of 65%, and no regional  motion abnormalities.  She did have mild mitral regurgitation and a trivial  posterior pericardial effusion.  She continues to undergo other health  maintenance evaluation and, in fact, tells me she had a CT scan of the chest  done today.  These issues are being followed by Dr. Jonny Ruiz at this point.   ALLERGIES:  No known drug allergies.   PRESENT MEDICATIONS:  1.  Calcium with vitamin D 1200 mg p.o. two tablets daily.  2.  Aspirin 81 mg p.o. daily.  3.  Multivitamin one p.o. daily.  4.  Coumadin as directed by the Coumadin Clinic.  5.  Benazepril 10 mg p.o. daily.  6.  Hydrochlorothiazide 12.5 mg p.o. daily.  7.  Toprol XL 50 mg p.o. daily.   REVIEW OF SYSTEMS:  As described in history of present illness.  She has not  had any bleeding problems.   PHYSICAL EXAMINATION:  VITAL SIGNS:  Blood pressure is 112/70, heart rate 90  and irregular in atrial fibrillation.   Weight is 197 pounds.  GENERAL:  Patient is in no acute distress.  NECK:  No elevated jugular venous pressure or loud bruits.  LUNGS:  Generally clear.  CARDIAC:  Irregularly irregular rhythm without loud murmur or pericardial  rub.  EXTREMITIES:  Improved edema.   IMPRESSION/RECOMMENDATIONS:  1.  History of atrial fibrillation, likely paroxysmal over the last several      years.  She is on Coumadin and plan will be to continue rate control      with Toprol XL.  This can certainly be adjusted as needed.  Complicating      matters is apparent hyperthyroid state which is presently being      evaluated by primary care.  I will plan to see her back over the next      six months.  2.  Previous history of constrictive pericarditis with reported pericardial      effusion requiring window.  Recent echocardiogram demonstrates normal      left ventricular  systolic function with a trivial posterior effusion.                                Jonelle Sidle, MD    SGM/MedQ  DD:  05/01/2006  DT:  05/01/2006  Job #:  [id number]   cc:   Corwin Levins, MD

## 2011-03-08 NOTE — Assessment & Plan Note (Signed)
[facility]                            CARDIOLOGY OFFICE NOTE   Monica Mora, Monica Mora                    MRN:          [id number]  DATE:12/11/2006                            DOB:          1935/02/10    PRIMARY CARE PHYSICIAN:  Dr. Oliver Barre.   ENDOCRINOLOGIST:  Dr. Romero Belling.   REASON FOR VISIT:  Follow up atrial fibrillation.   HISTORY OF PRESENT ILLNESS:  Monica Mora returns to clinic today stating  that she feels well.  She denies having any major symptom of  palpitations and continues to exercise regularly at [employer].  She is  undergoing treatment for her thyroid disease with Dr. Everardo All and my  understanding is that this is to be managed with medication adjustments.  She is not having any bleeding problems and maintains followup in the  [facility].  Her electrocardiogram today shows atrial fibrillation  at 91 b.p.m. with no marked changes in comparison to the prior tracing  in June of last year.   ALLERGIES:  NO KNOWN DRUG ALLERGIES.   PRESENT MEDICATIONS:  1. Calcium with Vitamin D 1200 mg two tablets daily.  2. Aspirin 81 mg daily.  3. Multivitamin daily.  4. Toprol-XL 50 mg daily.  5. Coumadin as directed by the [facility].  6. Hydrochlorothiazide 12.5 mg daily.  7. Benazepril 10 mg daily.   REVIEW OF SYSTEMS:  As described in the History of Present Illness.   EXAMINATION:  Blood pressure is 151/73, although was 112/70 at her last  visit, heart rate in the 70's at rest, weight is 196 pounds.  The  patient is comfortable, in no acute distress.  NECK:  No elevated jugular venous pressure, without bruits.  LUNGS:  Clear without labored breathing.  CARDIAC:  An irregularly irregular rhythm without pericardial rub or S3  gallop.  EXTREMITIES:  Show no significant pitting edema.   IMPRESSION AND RECOMMENDATION:  1. Paroxysmal to persistent atrial fibrillation.  Will continue with      plan of anticoagulation and rate  control.  No specific medication      adjustments made today.  Perhaps with further management of her      hyperthyroidism she may have even better control of her atrial      fibrillation over time.  I will plan to see her back for symptom      review in the next 6 months.  2. History of prior constrictive pericarditis with effusion requiring      window.  This was stable by echocardiogram around the time of her      last visit.     Jonelle Sidle, MD  Electronically Signed    SGM/MedQ  DD: 12/11/2006  DT: 12/11/2006  Job #: [id number]   cc:   Corwin Levins, MD  Cleophas Dunker Everardo All, MD

## 2011-03-12 ENCOUNTER — Ambulatory Visit (INDEPENDENT_AMBULATORY_CARE_PROVIDER_SITE_OTHER): Payer: Medicare Other | Admitting: *Deleted

## 2011-03-12 DIAGNOSIS — I4891 Unspecified atrial fibrillation: Secondary | ICD-10-CM

## 2011-03-26 ENCOUNTER — Telehealth: Payer: Self-pay | Admitting: Internal Medicine

## 2011-03-26 ENCOUNTER — Ambulatory Visit (INDEPENDENT_AMBULATORY_CARE_PROVIDER_SITE_OTHER): Payer: Medicare Other | Admitting: *Deleted

## 2011-03-26 DIAGNOSIS — I4891 Unspecified atrial fibrillation: Secondary | ICD-10-CM

## 2011-03-26 NOTE — Telephone Encounter (Signed)
Pt has a PT appt today at 11:45.  She would like to pick up a few samples of Crestor 5 mg as well as get a prescription called into [facility]  [phone].

## 2011-04-04 ENCOUNTER — Other Ambulatory Visit: Payer: Self-pay | Admitting: *Deleted

## 2011-04-04 MED ORDER — LEVOTHYROXINE SODIUM 75 MCG PO TABS: 75.0000 ug | ORAL_TABLET | Freq: Every day | ORAL | Status: DC

## 2011-04-04 NOTE — Telephone Encounter (Signed)
Pt is requesting refill of Levothyroxine.

## 2011-04-04 NOTE — Telephone Encounter (Signed)
Pt requesting refill of medication-she will run out before appointment with MD on 6/25. Left message for pt to callback office for name of medication

## 2011-04-04 NOTE — Telephone Encounter (Signed)
Spoke with pt when she went to coumadin clinic everything was taken care of as far as samples and refills

## 2011-04-09 ENCOUNTER — Ambulatory Visit (INDEPENDENT_AMBULATORY_CARE_PROVIDER_SITE_OTHER): Payer: Medicare Other | Admitting: *Deleted

## 2011-04-09 DIAGNOSIS — I4891 Unspecified atrial fibrillation: Secondary | ICD-10-CM

## 2011-04-15 ENCOUNTER — Encounter: Payer: Self-pay | Admitting: Endocrinology

## 2011-04-15 ENCOUNTER — Ambulatory Visit (INDEPENDENT_AMBULATORY_CARE_PROVIDER_SITE_OTHER): Payer: Medicare Other | Admitting: Endocrinology

## 2011-04-15 VITALS — BP 126/72 | HR 63 | Temp 98.4°F | Ht 67.5 in | Wt 220.2 lb

## 2011-04-15 DIAGNOSIS — E042 Nontoxic multinodular goiter: Secondary | ICD-10-CM

## 2011-04-15 MED ORDER — LEVOTHYROXINE SODIUM 75 MCG PO TABS: 75.0000 ug | ORAL_TABLET | Freq: Every day | ORAL | Status: DC

## 2011-04-15 NOTE — Progress Notes (Signed)
Subjective:    Patient ID: Monica Mora, female    DOB: 01/19/1935, 76 y.o.   MRN: [id number]  HPI pt had i-131 rx for hyperthyroidism (due to multinodular goiter) in 2008.  pt states she feels well in general.  She does not notice the goiter.  she takes synthroid as rx'ed. Past Medical History  Diagnosis Date  . Heart murmur   . TB (tuberculosis) 2002    positive  . Abnormal chest x-ray   . Other voice and resonance disorders   . Glaucoma   . Unspecified essential hypertension   . Unspecified hypothyroidism   . Nontoxic multinodular goiter   . Constrictive pericarditis   . Atrial fibrillation   . Hyperlipidemia   . CHF (congestive heart failure)   . H/O: hysterectomy 1992    Past Surgical History  Procedure Date  . Pericardial window 2002  . Abdominal hysterectomy 1992  . Radioactive iodine for hyperthyroidism/multinodular goiter     s/p    History   Social History  . Marital Status: Single    Spouse Name: N/A    Number of Children: 0  . Years of Education: N/A   Occupational History  . Retired     Paramedic work  .     Social History Main Topics  . Smoking status: Former Smoker    Quit date: 10/21/1978  . Smokeless tobacco: Not on file  . Alcohol Use: No  . Drug Use: No  . Sexually Active: Not on file   Other Topics Concern  . Not on file   Social History Narrative   Daily Caffeine Use: 1 daily    Current Outpatient Prescriptions on File Prior to Visit  Medication Sig Dispense Refill  . aspirin 81 MG tablet Take 81 mg by mouth daily.        Marland Kitchen b complex vitamins tablet Take 1 tablet by mouth daily.        . Cholecalciferol (VITAMIN D) 1000 UNITS capsule Take 1,000 Units by mouth daily.        . Coenzyme Q10 (COQ10) 100 MG CAPS Take 100 mg by mouth daily.        . fish oil-omega-3 fatty acids 1000 MG capsule Take 1 capsule by mouth daily.       . hydrochlorothiazide (,MICROZIDE/HYDRODIURIL,) 12.5 MG capsule Take 12.5 mg by mouth daily.        Marland Kitchen  levothyroxine (SYNTHROID, LEVOTHROID) 75 MCG tablet Take 1 tablet (75 mcg total) by mouth daily.  30 tablet  1  . lisinopril (PRINIVIL,ZESTRIL) 40 MG tablet Take 40 mg by mouth daily.        . metoprolol tartrate (LOPRESSOR) 25 MG tablet Take 25 mg by mouth 2 (two) times daily.        . Multiple Vitamins-Minerals (CENTRUM) tablet Take 1 tablet by mouth daily.        . rosuvastatin (CRESTOR) 5 MG tablet Take half tablet by mouth daily at bedtime  30 tablet  11  . warfarin (COUMADIN) 5 MG tablet Take by mouth as directed.          No Known Allergies  Family History  Problem Relation Age of Onset  . Asthma Brother   . Arthritis Mother   . Arthritis Father   . Cervical cancer Sister   . Cancer Sister     Cervical Cancer  no goiter or other thyroid probs. BP 126/72  Pulse 63  Temp(Src) 98.4 F (36.9 C) (Oral)  Ht  5' 7.5" (1.715 m)  Wt 220 lb 3.2 oz (99.882 kg)  BMI 33.98 kg/m2  SpO2 97%  Review of Systems Denies weight change.     Objective:   Physical Exam GENERAL: no distress Neck - there is a 1.5 cm right thyroid nodule.    Lab Results  Component Value Date   TSH 4.02 07/03/2010    Assessment & Plan:  Multinodular goiter, unchanged.   Post-i-131 hypothyroidism, well-replaced

## 2011-04-15 NOTE — Patient Instructions (Signed)
Let's check a repeat thyroid ultrasound.  you will be called with a day and time for an appointment.  Then please call [phone] to hear your test results.  You will be prompted to enter the 9-digit "MRN" number that appears at the top left of this page, followed by #.  Then you will hear the message. Please return in 2 years.

## 2011-04-23 ENCOUNTER — Ambulatory Visit (INDEPENDENT_AMBULATORY_CARE_PROVIDER_SITE_OTHER): Payer: Medicare Other | Admitting: *Deleted

## 2011-04-23 DIAGNOSIS — I4891 Unspecified atrial fibrillation: Secondary | ICD-10-CM

## 2011-04-23 LAB — POCT INR: INR: 2.3

## 2011-04-30 ENCOUNTER — Ambulatory Visit
Admission: RE | Admit: 2011-04-30 | Discharge: 2011-04-30 | Disposition: A | Discharge status: Home / Self Care | Payer: Medicare Other | Source: Ambulatory Visit | Attending: Endocrinology | Admitting: Endocrinology

## 2011-04-30 DIAGNOSIS — E042 Nontoxic multinodular goiter: Secondary | ICD-10-CM

## 2011-05-14 ENCOUNTER — Ambulatory Visit (INDEPENDENT_AMBULATORY_CARE_PROVIDER_SITE_OTHER): Payer: Medicare Other | Admitting: *Deleted

## 2011-05-14 DIAGNOSIS — I4891 Unspecified atrial fibrillation: Secondary | ICD-10-CM

## 2011-06-11 ENCOUNTER — Ambulatory Visit (INDEPENDENT_AMBULATORY_CARE_PROVIDER_SITE_OTHER): Payer: Medicare Other | Admitting: *Deleted

## 2011-06-11 DIAGNOSIS — I4891 Unspecified atrial fibrillation: Secondary | ICD-10-CM

## 2011-07-09 ENCOUNTER — Ambulatory Visit (INDEPENDENT_AMBULATORY_CARE_PROVIDER_SITE_OTHER): Payer: Medicare Other | Admitting: *Deleted

## 2011-07-09 DIAGNOSIS — I4891 Unspecified atrial fibrillation: Secondary | ICD-10-CM

## 2011-07-09 LAB — POCT INR: INR: 2.2

## 2011-08-06 ENCOUNTER — Ambulatory Visit (INDEPENDENT_AMBULATORY_CARE_PROVIDER_SITE_OTHER): Payer: Medicare Other | Admitting: *Deleted

## 2011-08-06 DIAGNOSIS — I4891 Unspecified atrial fibrillation: Secondary | ICD-10-CM

## 2011-09-03 ENCOUNTER — Ambulatory Visit (INDEPENDENT_AMBULATORY_CARE_PROVIDER_SITE_OTHER): Payer: Medicare Other | Admitting: *Deleted

## 2011-09-03 DIAGNOSIS — I4891 Unspecified atrial fibrillation: Secondary | ICD-10-CM

## 2011-09-03 DIAGNOSIS — Z7901 Long term (current) use of anticoagulants: Secondary | ICD-10-CM

## 2011-09-03 LAB — POCT INR: INR: 2.6

## 2011-10-10 ENCOUNTER — Ambulatory Visit (INDEPENDENT_AMBULATORY_CARE_PROVIDER_SITE_OTHER): Payer: Medicare Other | Admitting: *Deleted

## 2011-10-10 DIAGNOSIS — I4891 Unspecified atrial fibrillation: Secondary | ICD-10-CM

## 2011-10-10 LAB — POCT INR: INR: 1.8

## 2011-10-26 ENCOUNTER — Encounter: Payer: Self-pay | Admitting: Internal Medicine

## 2011-10-26 DIAGNOSIS — Z0001 Encounter for general adult medical examination with abnormal findings: Secondary | ICD-10-CM | POA: Insufficient documentation

## 2011-10-26 DIAGNOSIS — Z0000 Encounter for general adult medical examination without abnormal findings: Secondary | ICD-10-CM

## 2011-10-31 ENCOUNTER — Encounter: Payer: Self-pay | Admitting: Internal Medicine

## 2011-10-31 ENCOUNTER — Ambulatory Visit (INDEPENDENT_AMBULATORY_CARE_PROVIDER_SITE_OTHER): Payer: Medicare Other | Admitting: Internal Medicine

## 2011-10-31 VITALS — BP 122/64 | HR 62 | Temp 97.6°F | Ht 67.0 in | Wt 222.1 lb

## 2011-10-31 DIAGNOSIS — J40 Bronchitis, not specified as acute or chronic: Secondary | ICD-10-CM

## 2011-10-31 DIAGNOSIS — Z23 Encounter for immunization: Secondary | ICD-10-CM

## 2011-10-31 DIAGNOSIS — Z0000 Encounter for general adult medical examination without abnormal findings: Secondary | ICD-10-CM

## 2011-10-31 MED ORDER — HYDROCODONE-HOMATROPINE 5-1.5 MG/5ML PO SYRP: 5.0000 mL | ORAL_SOLUTION | Freq: Four times a day (QID) | ORAL | Status: AC | PRN

## 2011-10-31 NOTE — Patient Instructions (Signed)
You had the flu shot today Continue all other medications as before, including the cough medicine Please go to LAB in the Basement for the blood and/or urine tests to be done at your convenience at [facility] Please call the phone number [phone] (the PhoneTree System) for results of testing in 2-3 days;  When calling, simply dial the number, and when prompted enter the MRN number above (the Medical Record Number) and the # key, then the message should start. You are otherwise up to date with prevention Please return in 1 year for your yearly visit, or sooner if needed, with Lab testing done 3-5 days before

## 2011-10-31 NOTE — Assessment & Plan Note (Signed)
Resolving but persistent cough - for cough med prn,  to f/u any worsening symptoms or concerns

## 2011-10-31 NOTE — Assessment & Plan Note (Signed)

## 2011-11-03 ENCOUNTER — Encounter: Payer: Self-pay | Admitting: Internal Medicine

## 2011-11-03 NOTE — Progress Notes (Signed)
Subjective:    Patient ID: Monica Mora, female    DOB: Feb 02, 1935, 77 y.o.   MRN: [id number]  HPI  Here for wellness and f/u;  Overall doing ok;  Pt denies CP, worsening SOB, DOE, wheezing, orthopnea, PND, worsening LE edema, palpitations, dizziness or syncope.  Pt denies neurological change such as new Headache, facial or extremity weakness.  Pt denies polydipsia, polyuria, or low sugar symptoms. Pt states overall good compliance with treatment and medications, good tolerability, and trying to follow lower cholesterol diet.  Pt denies worsening depressive symptoms, suicidal ideation or panic. No fever, wt loss, night sweats, loss of appetite, or other constitutional symptoms.  Pt states good ability with ADL's, low fall risk, home safety reviewed and adequate, no significant changes in hearing or vision, and occasionally active with exercise.  Has had some bronchitis acute symtpoms in the past wk, now resolved except for persistent nonprod cough. Past Medical History  Diagnosis Date  . Heart murmur   . TB (tuberculosis) 2002    positive  . Abnormal chest x-ray   . Other voice and resonance disorders   . Glaucoma   . Unspecified essential hypertension   . Unspecified hypothyroidism   . Nontoxic multinodular goiter   . Constrictive pericarditis   . Atrial fibrillation   . Hyperlipidemia   . CHF (congestive heart failure)   . H/O: hysterectomy 1992   Past Surgical History  Procedure Date  . Pericardial window 2002  . Abdominal hysterectomy 1992  . Radioactive iodine for hyperthyroidism/multinodular goiter     s/p    reports that she quit smoking about 33 years ago. She does not have any smokeless tobacco history on file. She reports that she does not drink alcohol or use illicit drugs. family history includes Arthritis in her father and mother; Asthma in her brother; Cancer in her sister; and Cervical cancer in her sister. No Known Allergies Current Outpatient Prescriptions on  File Prior to Visit  Medication Sig Dispense Refill  . aspirin 81 MG tablet Take 81 mg by mouth daily.        Marland Kitchen b complex vitamins tablet Take 1 tablet by mouth daily.        . Cholecalciferol (VITAMIN D) 1000 UNITS capsule Take 1,000 Units by mouth daily.        . fish oil-omega-3 fatty acids 1000 MG capsule Take 1 capsule by mouth daily.       . hydrochlorothiazide (,MICROZIDE/HYDRODIURIL,) 12.5 MG capsule Take 12.5 mg by mouth daily.        Marland Kitchen levothyroxine (SYNTHROID, LEVOTHROID) 75 MCG tablet Take 1 tablet (75 mcg total) by mouth daily.  90 tablet  3  . lisinopril (PRINIVIL,ZESTRIL) 40 MG tablet Take 40 mg by mouth daily.        . metoprolol tartrate (LOPRESSOR) 25 MG tablet Take 25 mg by mouth 2 (two) times daily.        . Multiple Vitamins-Minerals (CENTRUM) tablet Take 1 tablet by mouth daily.        . rosuvastatin (CRESTOR) 5 MG tablet Take half tablet by mouth daily at bedtime  30 tablet  11  . warfarin (COUMADIN) 5 MG tablet Take by mouth as directed.         Review of Systems Review of Systems  Constitutional: Negative for diaphoresis, activity change, appetite change and unexpected weight change.  HENT: Negative for hearing loss, ear pain, facial swelling, mouth sores and neck stiffness.   Eyes: Negative for pain,  redness and visual disturbance.  Respiratory: Negative for shortness of breath and wheezing.   Cardiovascular: Negative for chest pain and palpitations.  Gastrointestinal: Negative for diarrhea, blood in stool, abdominal distention and rectal pain.  Genitourinary: Negative for hematuria, flank pain and decreased urine volume.  Musculoskeletal: Negative for myalgias and joint swelling.  Skin: Negative for color change and wound.  Neurological: Negative for syncope and numbness.  Hematological: Negative for adenopathy.  Psychiatric/Behavioral: Negative for hallucinations, self-injury, decreased concentration and agitation.      Objective:   Physical Exam BP 122/64   Pulse 62  Temp(Src) 97.6 F (36.4 C) (Oral)  Ht 5\' 7"  (1.702 m)  Wt 222 lb 2 oz (100.755 kg)  BMI 34.79 kg/m2  SpO2 97% Physical Exam  VS noted Constitutional: Pt is oriented to person, place, and time. Appears well-developed and well-nourished.  HENT:  Head: Normocephalic and atraumatic.  Right Ear: External ear normal.  Left Ear: External ear normal.  Nose: Nose normal.  Mouth/Throat: Oropharynx is clear and moist.  Bilat tm's mild erythema.  Sinus nontender.  Pharynx mild erythema Eyes: Conjunctivae and EOM are normal. Pupils are equal, round, and reactive to light.  Neck: Normal range of motion. Neck supple. No JVD present. No tracheal deviation present.  Cardiovascular: Normal rate, regular rhythm, normal heart sounds and intact distal pulses.   Pulmonary/Chest: Effort normal and breath sounds normal.  Abdominal: Soft. Bowel sounds are normal. There is no tenderness.  Musculoskeletal: Normal range of motion. Exhibits no edema.  Lymphadenopathy:  Has no cervical adenopathy.  Neurological: Pt is alert and oriented to person, place, and time. Pt has normal reflexes. No cranial nerve deficit.  Skin: Skin is warm and dry. No rash noted.  Psychiatric:  Has  normal mood and affect. Behavior is normal.     Assessment & Plan:

## 2011-11-07 ENCOUNTER — Telehealth: Payer: Self-pay | Admitting: Internal Medicine

## 2011-11-21 ENCOUNTER — Ambulatory Visit (INDEPENDENT_AMBULATORY_CARE_PROVIDER_SITE_OTHER): Payer: Medicare Other | Admitting: *Deleted

## 2011-11-21 ENCOUNTER — Other Ambulatory Visit: Payer: Medicare Other | Admitting: *Deleted

## 2011-11-21 DIAGNOSIS — Z7901 Long term (current) use of anticoagulants: Secondary | ICD-10-CM

## 2011-11-21 DIAGNOSIS — I4891 Unspecified atrial fibrillation: Secondary | ICD-10-CM

## 2011-11-21 LAB — POCT INR: INR: 1.7

## 2011-11-22 ENCOUNTER — Other Ambulatory Visit (INDEPENDENT_AMBULATORY_CARE_PROVIDER_SITE_OTHER): Payer: Medicare Other | Admitting: *Deleted

## 2011-11-22 DIAGNOSIS — Z79899 Other long term (current) drug therapy: Secondary | ICD-10-CM

## 2011-11-22 DIAGNOSIS — Z0000 Encounter for general adult medical examination without abnormal findings: Secondary | ICD-10-CM

## 2011-11-22 LAB — LIPID PANEL
HDL: 39.7 mg/dL (ref >39.00)
Total CHOL/HDL Ratio: 3
Triglycerides: 86 mg/dL (ref 0.0–149.0)

## 2011-11-22 LAB — BASIC METABOLIC PANEL
CO2: 27 mEq/L (ref 19–32)
Calcium: 9.1 mg/dL (ref 8.4–10.5)
Chloride: 104 mEq/L (ref 96–112)
Sodium: 136 mEq/L (ref 135–145)

## 2011-11-22 LAB — HEPATIC FUNCTION PANEL
Alkaline Phosphatase: 34 U/L — ABNORMAL LOW (ref 39–117)
Bilirubin, Direct: 0 mg/dL (ref 0.0–0.3)
Total Protein: 7.3 g/dL (ref 6.0–8.3)

## 2011-11-28 ENCOUNTER — Ambulatory Visit (INDEPENDENT_AMBULATORY_CARE_PROVIDER_SITE_OTHER): Payer: Medicare Other | Admitting: Internal Medicine

## 2011-11-28 ENCOUNTER — Encounter: Payer: Self-pay | Admitting: Internal Medicine

## 2011-11-28 VITALS — BP 130/76 | HR 60 | Ht 67.0 in | Wt 222.0 lb

## 2011-11-28 DIAGNOSIS — E785 Hyperlipidemia, unspecified: Secondary | ICD-10-CM

## 2011-11-28 DIAGNOSIS — I10 Essential (primary) hypertension: Secondary | ICD-10-CM

## 2011-11-28 DIAGNOSIS — I4891 Unspecified atrial fibrillation: Secondary | ICD-10-CM

## 2011-11-28 DIAGNOSIS — I311 Chronic constrictive pericarditis: Secondary | ICD-10-CM

## 2011-11-28 MED ORDER — METOPROLOL TARTRATE 25 MG PO TABS: 25.0000 mg | ORAL_TABLET | Freq: Two times a day (BID) | ORAL | Status: DC

## 2011-11-28 MED ORDER — WARFARIN SODIUM 5 MG PO TABS: 5.0000 mg | ORAL_TABLET | ORAL | Status: DC

## 2011-11-28 NOTE — Progress Notes (Signed)
Marland Kitchen HPI Patient is a 77 year old with a history of PAF and reported constrictive pericarditis though history suggests simple pericarditis with window placement.  No records available.  I saw her last April. Since seen has been on Crestor.  Lipids are much better with LDL of 79. Notes some palins in LE (ankles)  Quesiton Crestor. Breathing has been good.  Had cold earlier in winter No palpitaitons. Occasional "pin pain"  Occurs at rest.  Lasts couple seconds. Walking regularly.  Going to try to start going to gym No Known Allergies  Current Outpatient Prescriptions  Medication Sig Dispense Refill  . aspirin 81 MG tablet Take 81 mg by mouth daily.        Marland Kitchen b complex vitamins tablet Take 1 tablet by mouth daily.        . Cholecalciferol (VITAMIN D) 1000 UNITS capsule Take 1,000 Units by mouth daily.        . fish oil-omega-3 fatty acids 1000 MG capsule Take 1 capsule by mouth daily.       . hydrochlorothiazide (,MICROZIDE/HYDRODIURIL,) 12.5 MG capsule Take 12.5 mg by mouth daily.        Marland Kitchen levothyroxine (SYNTHROID, LEVOTHROID) 75 MCG tablet Take 1 tablet (75 mcg total) by mouth daily.  90 tablet  3  . lisinopril (PRINIVIL,ZESTRIL) 40 MG tablet Take 40 mg by mouth daily.        . metoprolol tartrate (LOPRESSOR) 25 MG tablet Take 25 mg by mouth 2 (two) times daily.        . Multiple Vitamins-Minerals (CENTRUM) tablet Take 1 tablet by mouth daily.        . rosuvastatin (CRESTOR) 5 MG tablet Take half tablet by mouth daily at bedtime  30 tablet  11  . warfarin (COUMADIN) 5 MG tablet Take by mouth as directed.        . dorzolamide-timolol (COSOPT) 22.3-6.8 MG/ML ophthalmic solution Place into both eyes as directed.        Past Medical History  Diagnosis Date  . Heart murmur   . TB (tuberculosis) 2002    positive  . Abnormal chest x-ray   . Other voice and resonance disorders   . Glaucoma   . Unspecified essential hypertension   . Unspecified hypothyroidism   . Nontoxic multinodular goiter     . Constrictive pericarditis   . Atrial fibrillation   . Hyperlipidemia   . CHF (congestive heart failure)   . H/O: hysterectomy 1992    Past Surgical History  Procedure Date  . Pericardial window 2002  . Abdominal hysterectomy 1992  . Radioactive iodine for hyperthyroidism/multinodular goiter     s/p    Family History  Problem Relation Age of Onset  . Asthma Brother   . Arthritis Mother   . Arthritis Father   . Cervical cancer Sister   . Cancer Sister     Cervical Cancer    History   Social History  . Marital Status: Single    Spouse Name: N/A    Number of Children: 0  . Years of Education: N/A   Occupational History  . Retired     Paramedic work  .     Social History Main Topics  . Smoking status: Former Smoker    Quit date: 10/21/1978  . Smokeless tobacco: Not on file  . Alcohol Use: No  . Drug Use: No  . Sexually Active: Not on file   Other Topics Concern  . Not on file  Social History Narrative   Daily Caffeine Use: 1 daily    Review of Systems:  All systems reviewed.  They are negative to the above problem except as previously stated.  Vital Signs: BP 130/76  Pulse 60  Ht 5\' 7"  (1.702 m)  Wt 222 lb (100.699 kg)  BMI 34.77 kg/m2  Physical Exam Patient is in NAD. HEENT:  Normocephalic, atraumatic. EOMI, PERRLA.  Neck: JVP is normal. No thyromegaly. No bruits.  Lungs: clear to auscultation. No rales no wheezes.  Heart: Regular rate and rhythm. Normal S1, S2. No S3.   No significant murmurs. PMI not displaced.  Abdomen:  Supple, nontender. Normal bowel sounds. No masses. No hepatomegaly.  Extremities:   Good distal pulses throughout. No lower extremity edema.  Musculoskeletal :moving all extremities.  Neuro:   alert and oriented x3.  CN II-XII grossly intact.  EKG:  Sinus bradycardia.  59 bpm.    Septal infarct.  Nonspecific ST T wave changes.  Assessment and Plan:

## 2011-11-28 NOTE — Assessment & Plan Note (Signed)
Adequate control. 

## 2011-11-28 NOTE — Assessment & Plan Note (Signed)
Asymptomatic. 

## 2011-11-28 NOTE — Assessment & Plan Note (Signed)
Keep on Crestor 

## 2011-11-28 NOTE — Patient Instructions (Addendum)
Silver Sneakers for Exercise 1 [phone]  HOLD CRESTOR to see if legs improve. If not resume medication. If improvement then call the office.  Your physician wants you to follow-up in: 12 months You will receive a reminder letter in the mail two months in advance. If you don't receive a letter, please call our office to schedule the follow-up appointment.

## 2011-11-28 NOTE — Assessment & Plan Note (Signed)
Rel asymptomatic.  Keep on current meds.

## 2011-12-12 ENCOUNTER — Telehealth: Payer: Self-pay | Admitting: Internal Medicine

## 2011-12-12 ENCOUNTER — Encounter: Payer: Medicare Other | Admitting: *Deleted

## 2011-12-12 NOTE — Telephone Encounter (Signed)
Pt was told to call back in 2 week with update

## 2011-12-12 NOTE — Telephone Encounter (Signed)
Patient called to let Dr.Ross know that when she is praying and on her knees and goes to turn quickly sometimes she feels "funny in the left side of her chest". I advised her to get up slowly. She states that she forgot to tell her this at the last visit. She also mentioned that she breaks out in a sweat sometimes. She is going to follow up with her primary doctor concerning possible thyroid issues because she has not had a TSH level in a long time.

## 2011-12-15 NOTE — Telephone Encounter (Signed)
Chest discomfort sounds musculoskel and not cardiac I agree with f/u with primary.

## 2011-12-16 ENCOUNTER — Encounter: Payer: Self-pay | Admitting: Endocrinology

## 2011-12-16 ENCOUNTER — Ambulatory Visit (INDEPENDENT_AMBULATORY_CARE_PROVIDER_SITE_OTHER): Payer: Medicare Other | Admitting: Endocrinology

## 2011-12-16 ENCOUNTER — Ambulatory Visit: Payer: Medicare Other | Admitting: Internal Medicine

## 2011-12-16 VITALS — BP 132/82 | HR 57 | Temp 98.0°F | Ht 67.5 in | Wt 223.8 lb

## 2011-12-16 DIAGNOSIS — E890 Postprocedural hypothyroidism: Secondary | ICD-10-CM

## 2011-12-16 NOTE — Patient Instructions (Addendum)
blood tests are being requested for you today.  please call [phone] to hear your test results.  You will be prompted to enter the 9-digit "MRN" number that appears at the top left of this page, followed by #.  Then you will hear the message. Please return in 1 year.

## 2011-12-16 NOTE — Progress Notes (Signed)
Subjective:    Patient ID: Monica Mora, female    DOB: 1934/12/01, 77 y.o.   MRN: [id number]  HPI Pt returns for f/u of post-i-131 hypothyroidism .  She has a few mos of intermittent episodes of a slight "sensation" in the chest, with associated "sensation" in the throat.  It is relieved by drinking cold water.  in the context of twisting the trunk of the body.  She saw cardiol, who recommended she be checked here.   Past Medical History  Diagnosis Date  . Heart murmur   . TB (tuberculosis) 2002    positive  . Abnormal chest x-ray   . Other voice and resonance disorders   . Glaucoma   . Unspecified essential hypertension   . Unspecified hypothyroidism   . Nontoxic multinodular goiter   . Constrictive pericarditis   . Atrial fibrillation   . Hyperlipidemia   . CHF (congestive heart failure)   . H/O: hysterectomy 1992    Past Surgical History  Procedure Date  . Pericardial window 2002  . Abdominal hysterectomy 1992  . Radioactive iodine for hyperthyroidism/multinodular goiter     s/p    History   Social History  . Marital Status: Single    Spouse Name: N/A    Number of Children: 0  . Years of Education: N/A   Occupational History  . Retired     Paramedic work  .     Social History Main Topics  . Smoking status: Former Smoker    Quit date: 10/21/1978  . Smokeless tobacco: Not on file  . Alcohol Use: No  . Drug Use: No  . Sexually Active: Not on file   Other Topics Concern  . Not on file   Social History Narrative   Daily Caffeine Use: 1 daily    Current Outpatient Prescriptions on File Prior to Visit  Medication Sig Dispense Refill  . aspirin 81 MG tablet Take 81 mg by mouth daily.        Marland Kitchen b complex vitamins tablet Take 1 tablet by mouth daily.        . Cholecalciferol (VITAMIN D) 1000 UNITS capsule Take 1,000 Units by mouth daily.        . dorzolamide-timolol (COSOPT) 22.3-6.8 MG/ML ophthalmic solution Place into both eyes as directed.      . fish  oil-omega-3 fatty acids 1000 MG capsule Take 1 capsule by mouth daily.       . hydrochlorothiazide (,MICROZIDE/HYDRODIURIL,) 12.5 MG capsule Take 12.5 mg by mouth daily.        Marland Kitchen levothyroxine (SYNTHROID, LEVOTHROID) 75 MCG tablet Take 1 tablet (75 mcg total) by mouth daily.  90 tablet  3  . lisinopril (PRINIVIL,ZESTRIL) 40 MG tablet Take 40 mg by mouth daily.        . metoprolol tartrate (LOPRESSOR) 25 MG tablet Take 1 tablet (25 mg total) by mouth 2 (two) times daily.  60 tablet  6  . Multiple Vitamins-Minerals (CENTRUM) tablet Take 1 tablet by mouth daily.        . rosuvastatin (CRESTOR) 5 MG tablet Take half tablet by mouth daily at bedtime  30 tablet  11  . warfarin (COUMADIN) 5 MG tablet Take 1 tablet (5 mg total) by mouth as directed.  60 tablet  3    No Known Allergies  Family History  Problem Relation Age of Onset  . Asthma Brother   . Arthritis Mother   . Arthritis Father   . Cervical cancer Sister   .  Cancer Sister     Cervical Cancer    BP 132/82  Pulse 57  Temp(Src) 98 F (36.7 C) (Oral)  Ht 5' 7.5" (1.715 m)  Wt 223 lb 12.8 oz (101.515 kg)  BMI 34.53 kg/m2  SpO2 97%    Review of Systems She has diaphoresis with these episodes    Objective:   Physical Exam VITAL SIGNS:  See vs page GENERAL: no distress Neck: there is slight fullness at the right thyroid lobe.   Lab Results  Component Value Date   TSH 4.02 07/03/2010      Assessment & Plan:  Multinodular goiter, unchanged Post-i-131 hypothyroidism, well-replaced

## 2011-12-19 ENCOUNTER — Ambulatory Visit (INDEPENDENT_AMBULATORY_CARE_PROVIDER_SITE_OTHER): Payer: Medicare Other | Admitting: *Deleted

## 2011-12-19 ENCOUNTER — Other Ambulatory Visit (INDEPENDENT_AMBULATORY_CARE_PROVIDER_SITE_OTHER): Payer: Medicare Other

## 2011-12-19 DIAGNOSIS — Z7901 Long term (current) use of anticoagulants: Secondary | ICD-10-CM

## 2011-12-19 DIAGNOSIS — I4891 Unspecified atrial fibrillation: Secondary | ICD-10-CM

## 2011-12-19 DIAGNOSIS — E890 Postprocedural hypothyroidism: Secondary | ICD-10-CM

## 2011-12-19 LAB — TSH: TSH: 1.23 u[IU]/mL (ref 0.35–5.50)

## 2011-12-23 ENCOUNTER — Other Ambulatory Visit: Payer: Self-pay | Admitting: Internal Medicine

## 2012-01-09 ENCOUNTER — Ambulatory Visit (INDEPENDENT_AMBULATORY_CARE_PROVIDER_SITE_OTHER): Payer: Medicare Other | Admitting: *Deleted

## 2012-01-09 DIAGNOSIS — Z7901 Long term (current) use of anticoagulants: Secondary | ICD-10-CM

## 2012-01-09 DIAGNOSIS — I4891 Unspecified atrial fibrillation: Secondary | ICD-10-CM

## 2012-01-09 LAB — POCT INR: INR: 2.6

## 2012-01-23 ENCOUNTER — Other Ambulatory Visit: Payer: Self-pay | Admitting: Internal Medicine

## 2012-01-23 MED ORDER — METOPROLOL TARTRATE 25 MG PO TABS: 25.0000 mg | ORAL_TABLET | Freq: Two times a day (BID) | ORAL | Status: DC

## 2012-01-23 NOTE — Telephone Encounter (Signed)
New msg Pt wanted to talk to you about which meds she is taking could make her legs sore. Please call her back

## 2012-01-27 ENCOUNTER — Other Ambulatory Visit: Payer: Self-pay

## 2012-01-27 ENCOUNTER — Ambulatory Visit: Payer: Medicare Other | Admitting: Internal Medicine

## 2012-01-27 DIAGNOSIS — Z0289 Encounter for other administrative examinations: Secondary | ICD-10-CM

## 2012-01-27 MED ORDER — WARFARIN SODIUM 5 MG PO TABS: ORAL_TABLET | ORAL | Status: DC

## 2012-02-03 ENCOUNTER — Encounter: Payer: Self-pay | Admitting: Internal Medicine

## 2012-02-03 ENCOUNTER — Ambulatory Visit (INDEPENDENT_AMBULATORY_CARE_PROVIDER_SITE_OTHER): Payer: Medicare Other | Admitting: Internal Medicine

## 2012-02-03 VITALS — BP 118/70 | HR 55 | Temp 97.2°F | Ht 67.5 in | Wt 220.0 lb

## 2012-02-03 DIAGNOSIS — K219 Gastro-esophageal reflux disease without esophagitis: Secondary | ICD-10-CM

## 2012-02-03 DIAGNOSIS — I10 Essential (primary) hypertension: Secondary | ICD-10-CM

## 2012-02-03 DIAGNOSIS — E785 Hyperlipidemia, unspecified: Secondary | ICD-10-CM

## 2012-02-03 MED ORDER — OMEPRAZOLE 20 MG PO CPDR: 20.0000 mg | DELAYED_RELEASE_CAPSULE | Freq: Every day | ORAL | Status: DC

## 2012-02-03 NOTE — Patient Instructions (Addendum)
Take all new medications as prescribed Continue all other medications as before Please avoid food that makes your reflux worse, peppermints, and eating before bed

## 2012-02-03 NOTE — Assessment & Plan Note (Signed)
Very straightforward hx, for prilosec 20 qd, anti-reflux precautions,  to f/u any worsening symptoms or concerns, consider GI eval if persists or worsens

## 2012-02-03 NOTE — Assessment & Plan Note (Signed)
stable overall by hx and exam, most recent data reviewed with pt, and pt to continue medical treatment as before Lab Results  Component Value Date   LDLCALC 79 11/22/2011

## 2012-02-03 NOTE — Assessment & Plan Note (Signed)
stable overall by hx and exam, most recent data reviewed with pt, and pt to continue medical treatment as before  BP Readings from Last 3 Encounters:  02/03/12 118/70  12/16/11 132/82  11/28/11 130/76

## 2012-02-03 NOTE — Progress Notes (Signed)
Subjective:    Patient ID: Monica Mora, female    DOB: October 14, 1935, 77 y.o.   MRN: [id number]  HPI here to f/u with new problem of abd pain, though she had an episode last yr with burnging to the mid chest up to the lower neck, radiates to the bilat chest, aossc with sweats, occurs with being on the knees praying with increased abd pressure,  Has been eating more fried foods lately, and peppermints.  No n/v, no radiation, no bowel or bladder change, no blood or dysphaiga; drinking warm water can sometimes help.  No recent wt loss.  No recent med change, has occurred with eating late at night as well.  Tries not to overeat but hard to lose wt, recent tsh normal.  Pt denies other chest pain, increased sob or doe, wheezing, orthopnea, PND, increased LE swelling, palpitations, dizziness or syncope.  Pt denies polydipsia, polyuria.  Has lost about 3 lbs per pt, plans to do more exercise soon up to 3-4 times per wk.  No prior hx of known CAD.  Hs not tried any OTC for antacid.    Past Medical History  Diagnosis Date  . Heart murmur   . TB (tuberculosis) 2002    positive  . Abnormal chest x-ray   . Other voice and resonance disorders   . Glaucoma   . Unspecified essential hypertension   . Unspecified hypothyroidism   . Nontoxic multinodular goiter   . Constrictive pericarditis   . Atrial fibrillation   . Hyperlipidemia   . CHF (congestive heart failure)   . H/O: hysterectomy 1992   Past Surgical History  Procedure Date  . Pericardial window 2002  . Abdominal hysterectomy 1992  . Radioactive iodine for hyperthyroidism/multinodular goiter     s/p    reports that she quit smoking about 33 years ago. She does not have any smokeless tobacco history on file. She reports that she does not drink alcohol or use illicit drugs. family history includes Arthritis in her father and mother; Asthma in her brother; Cancer in her sister; and Cervical cancer in her sister. No Known Allergies Current  Outpatient Prescriptions on File Prior to Visit  Medication Sig Dispense Refill  . aspirin 81 MG tablet Take 81 mg by mouth daily.        Marland Kitchen b complex vitamins tablet Take 1 tablet by mouth daily.        . Cholecalciferol (VITAMIN D) 1000 UNITS capsule Take 1,000 Units by mouth daily.        . dorzolamide-timolol (COSOPT) 22.3-6.8 MG/ML ophthalmic solution Place into both eyes as directed.      . fish oil-omega-3 fatty acids 1000 MG capsule Take 1 capsule by mouth daily.       . hydrochlorothiazide (MICROZIDE) 12.5 MG capsule TAKE ONE CAPSULE BY MOUTH EVERY DAY  90 capsule  3  . levothyroxine (SYNTHROID, LEVOTHROID) 75 MCG tablet Take 1 tablet (75 mcg total) by mouth daily.  90 tablet  3  . lisinopril (PRINIVIL,ZESTRIL) 40 MG tablet TAKE 1 TABLET BY MOUTH EVERY DAY  90 tablet  3  . metoprolol tartrate (LOPRESSOR) 25 MG tablet Take 1 tablet (25 mg total) by mouth 2 (two) times daily.  90 tablet  3  . Multiple Vitamins-Minerals (CENTRUM) tablet Take 1 tablet by mouth daily.        Marland Kitchen warfarin (COUMADIN) 5 MG tablet Take as directed by anticoagulation clinic  90 tablet  1  . rosuvastatin (CRESTOR) 5 MG  tablet Take half tablet by mouth daily at bedtime  30 tablet  11   Review of Systems Review of Systems  Constitutional: Negative for diaphoresis and unexpected weight change.  Respiratory: Negative for choking and stridor.   Gastrointestinal: Negative for vomiting and blood in stool.  Genitourinary: Negative for hematuria and decreased urine volume.  Musculoskeletal: Negative for gait problem.  Skin: Negative for color change and wound.  Neurological: Negative for tremors and numbness.  Psychiatric/Behavioral: Negative for decreased concentration. The patient is not hyperactive.       Objective:   Physical Exam BP 118/70  Pulse 55  Temp(Src) 97.2 F (36.2 C) (Oral)  Ht 5' 7.5" (1.715 m)  Wt 220 lb (99.791 kg)  BMI 33.95 kg/m2  SpO2 99% Physical Exam  VS noted Constitutional: Pt appears  well-developed and well-nourished.  HENT: Head: Normocephalic.  Right Ear: External ear normal.  Left Ear: External ear normal.  Eyes: Conjunctivae and EOM are normal. Pupils are equal, round, and reactive to light.  Neck: Normal range of motion. Neck supple.  Cardiovascular: Normal rate and regular rhythm.   Pulmonary/Chest: Effort normal and breath sounds normal.  Abd:  Soft, NT, non-distended, + BS - benign exan Neurological: Pt is alert. No gait problem Skin: Skin is warm. No erythema. No rash, has trace LE edema on left only Psychiatric: Pt behavior is normal. Thought content normal. not overly nervous or depressed affect    Assessment & Plan:

## 2012-02-04 ENCOUNTER — Ambulatory Visit (INDEPENDENT_AMBULATORY_CARE_PROVIDER_SITE_OTHER): Payer: Medicare Other

## 2012-02-04 DIAGNOSIS — Z7901 Long term (current) use of anticoagulants: Secondary | ICD-10-CM

## 2012-02-04 DIAGNOSIS — I4891 Unspecified atrial fibrillation: Secondary | ICD-10-CM

## 2012-03-02 ENCOUNTER — Telehealth: Payer: Self-pay

## 2012-03-02 DIAGNOSIS — K219 Gastro-esophageal reflux disease without esophagitis: Secondary | ICD-10-CM

## 2012-03-02 MED ORDER — OMEPRAZOLE 20 MG PO CPDR: 20.0000 mg | DELAYED_RELEASE_CAPSULE | Freq: Two times a day (BID) | ORAL | Status: DC

## 2012-03-02 NOTE — Telephone Encounter (Signed)
Patient called [facility] requesting a call back from nurse or MD

## 2012-03-02 NOTE — Telephone Encounter (Signed)
Ok to incr the prilosec to 20 bid  If this doesn't help, she should consider GI referral

## 2012-03-02 NOTE — Telephone Encounter (Signed)
Patient still having reflux problems as discussed at last OV.  Patient is taking the medication prescribed, but still having the symptoms please advise

## 2012-03-03 NOTE — Telephone Encounter (Signed)
Called left message to call back 

## 2012-03-03 NOTE — Telephone Encounter (Signed)
Called informed the patient of medication

## 2012-03-10 ENCOUNTER — Ambulatory Visit (INDEPENDENT_AMBULATORY_CARE_PROVIDER_SITE_OTHER): Payer: Medicare Other | Admitting: *Deleted

## 2012-03-10 DIAGNOSIS — Z7901 Long term (current) use of anticoagulants: Secondary | ICD-10-CM

## 2012-03-10 DIAGNOSIS — I4891 Unspecified atrial fibrillation: Secondary | ICD-10-CM

## 2012-04-21 ENCOUNTER — Ambulatory Visit (INDEPENDENT_AMBULATORY_CARE_PROVIDER_SITE_OTHER): Payer: Medicare Other | Admitting: *Deleted

## 2012-04-21 DIAGNOSIS — Z7901 Long term (current) use of anticoagulants: Secondary | ICD-10-CM

## 2012-04-21 DIAGNOSIS — I4891 Unspecified atrial fibrillation: Secondary | ICD-10-CM

## 2012-04-21 LAB — POCT INR: INR: 3.3

## 2012-05-19 ENCOUNTER — Ambulatory Visit (INDEPENDENT_AMBULATORY_CARE_PROVIDER_SITE_OTHER): Payer: Medicare Other | Admitting: *Deleted

## 2012-05-19 DIAGNOSIS — Z7901 Long term (current) use of anticoagulants: Secondary | ICD-10-CM

## 2012-05-19 DIAGNOSIS — I4891 Unspecified atrial fibrillation: Secondary | ICD-10-CM

## 2012-05-27 ENCOUNTER — Telehealth: Payer: Self-pay

## 2012-05-27 DIAGNOSIS — K219 Gastro-esophageal reflux disease without esophagitis: Secondary | ICD-10-CM

## 2012-05-27 NOTE — Telephone Encounter (Signed)
Pt called requesting referral to GI for persistent GERD sxs.

## 2012-05-27 NOTE — Telephone Encounter (Signed)
Called the patient left detailed message that referral requested has been done.

## 2012-05-27 NOTE — Telephone Encounter (Signed)
Done per emr 

## 2012-06-16 ENCOUNTER — Other Ambulatory Visit: Payer: Self-pay | Admitting: Endocrinology

## 2012-06-16 ENCOUNTER — Ambulatory Visit (INDEPENDENT_AMBULATORY_CARE_PROVIDER_SITE_OTHER): Payer: Medicare Other

## 2012-06-16 DIAGNOSIS — Z7901 Long term (current) use of anticoagulants: Secondary | ICD-10-CM

## 2012-06-16 DIAGNOSIS — I4891 Unspecified atrial fibrillation: Secondary | ICD-10-CM

## 2012-06-16 LAB — POCT INR: INR: 1.8

## 2012-06-24 ENCOUNTER — Encounter: Payer: Self-pay | Admitting: General Practice

## 2012-07-08 ENCOUNTER — Ambulatory Visit: Payer: Medicare Other | Admitting: Gastroenterology

## 2012-07-14 ENCOUNTER — Ambulatory Visit (INDEPENDENT_AMBULATORY_CARE_PROVIDER_SITE_OTHER): Payer: Medicare Other | Admitting: *Deleted

## 2012-07-14 DIAGNOSIS — I4891 Unspecified atrial fibrillation: Secondary | ICD-10-CM

## 2012-07-14 DIAGNOSIS — Z7901 Long term (current) use of anticoagulants: Secondary | ICD-10-CM

## 2012-07-17 ENCOUNTER — Telehealth: Payer: Self-pay | Admitting: *Deleted

## 2012-07-17 ENCOUNTER — Encounter: Payer: Self-pay | Admitting: Nurse Practitioner

## 2012-07-17 ENCOUNTER — Ambulatory Visit (INDEPENDENT_AMBULATORY_CARE_PROVIDER_SITE_OTHER): Payer: Medicare Other | Admitting: Nurse Practitioner

## 2012-07-17 VITALS — BP 140/78 | HR 69 | Ht 67.5 in | Wt 217.1 lb

## 2012-07-17 DIAGNOSIS — I4891 Unspecified atrial fibrillation: Secondary | ICD-10-CM

## 2012-07-17 DIAGNOSIS — E785 Hyperlipidemia, unspecified: Secondary | ICD-10-CM

## 2012-07-17 DIAGNOSIS — M79609 Pain in unspecified limb: Secondary | ICD-10-CM

## 2012-07-17 DIAGNOSIS — I480 Paroxysmal atrial fibrillation: Secondary | ICD-10-CM

## 2012-07-17 DIAGNOSIS — R079 Chest pain, unspecified: Secondary | ICD-10-CM

## 2012-07-17 DIAGNOSIS — M79606 Pain in leg, unspecified: Secondary | ICD-10-CM

## 2012-07-17 LAB — BASIC METABOLIC PANEL
BUN: 6 mg/dL (ref 6–23)
CO2: 28 mEq/L (ref 19–32)
Calcium: 8.8 mg/dL (ref 8.4–10.5)
Chloride: 101 mEq/L (ref 96–112)
Creatinine, Ser: 0.8 mg/dL (ref 0.4–1.2)
GFR: 92.61 mL/min (ref >60.00)
Glucose, Bld: 90 mg/dL (ref 70–99)
Potassium: 3.4 mEq/L — ABNORMAL LOW (ref 3.5–5.1)
Sodium: 136 mEq/L (ref 135–145)

## 2012-07-17 LAB — CBC WITH DIFFERENTIAL/PLATELET
Basophils Absolute: 0 10*3/uL (ref 0.0–0.1)
Basophils Relative: 0.5 % (ref 0.0–3.0)
Eosinophils Absolute: 0.1 10*3/uL (ref 0.0–0.7)
Eosinophils Relative: 1.9 % (ref 0.0–5.0)
HCT: 40.8 % (ref 36.0–46.0)
Hemoglobin: 13.2 g/dL (ref 12.0–15.0)
Lymphocytes Relative: 30.4 % (ref 12.0–46.0)
Lymphs Abs: 2.3 10*3/uL (ref 0.7–4.0)
MCHC: 32.3 g/dL (ref 30.0–36.0)
MCV: 98.2 fl (ref 78.0–100.0)
Monocytes Absolute: 0.7 10*3/uL (ref 0.1–1.0)
Monocytes Relative: 9.1 % (ref 3.0–12.0)
Neutro Abs: 4.5 10*3/uL (ref 1.4–7.7)
Neutrophils Relative %: 58.1 % (ref 43.0–77.0)
Platelets: 207 10*3/uL (ref 150.0–400.0)
RBC: 4.16 Mil/uL (ref 3.87–5.11)
RDW: 13.9 % (ref 11.5–14.6)
WBC: 7.7 10*3/uL (ref 4.5–10.5)

## 2012-07-17 LAB — HEPATIC FUNCTION PANEL
ALT: 17 U/L (ref 0–35)
AST: 17 U/L (ref 0–37)
Albumin: 3.5 g/dL (ref 3.5–5.2)
Alkaline Phosphatase: 40 U/L (ref 39–117)
Bilirubin, Direct: 0 mg/dL (ref 0.0–0.3)
Total Bilirubin: 0.6 mg/dL (ref 0.3–1.2)
Total Protein: 7.4 g/dL (ref 6.0–8.3)

## 2012-07-17 LAB — LIPID PANEL
Cholesterol: 212 mg/dL — ABNORMAL HIGH (ref 0–200)
HDL: 29.9 mg/dL — ABNORMAL LOW (ref >39.00)
Total CHOL/HDL Ratio: 7
Triglycerides: 133 mg/dL (ref 0.0–149.0)
VLDL: 26.6 mg/dL (ref 0.0–40.0)

## 2012-07-17 LAB — LDL CHOLESTEROL, DIRECT: Direct LDL: 149.7 mg/dL

## 2012-07-17 MED ORDER — ROSUVASTATIN CALCIUM 10 MG PO TABS: 10.0000 mg | ORAL_TABLET | Freq: Every day | ORAL | Status: DC

## 2012-07-17 NOTE — Patient Instructions (Signed)
We are going to check labs today  For now, stay on your current medicines  We are going to arrange for a Lexiscan stress test and arterial dopplers of your legs.   Call the [facility] office at [phone] if you have any questions, problems or concerns.

## 2012-07-17 NOTE — Progress Notes (Signed)
Monica Mora Date of Birth: 04-02-1935 Medical Record #[id number]  History of Present Illness: Monica Mora is seen today for a follow up visit. She is seen for Dr. Tenny Craw. She has a history of PAF and reported constrictive pericarditis though history suggested only simple pericarditis with window placement. She has had HLD, hypothyroidism, HTN and GERD. She is maintained on chronic anticoagulation. No stress test noted in the past.   She comes in today. She is here alone. She was last here in February and felt to be doing well. She saw Dr. Jonny Ruiz back in April with complaints of abdominal and chest burning. This was felt to be more reflux. She is on PPI therapy. This past week she has had more upper chest discomfort. Only lasts for a few minutes but makes her short of breath and clammy. No coughing. Not exertional but she admits that she is pretty sedentary. She is to see Dr. Arlyce Dice next month but was worried that her heart may be acting up. Her legs are still sore. She has been off of her Crestor for many months. No recent labs noted.  Current Outpatient Prescriptions on File Prior to Visit  Medication Sig Dispense Refill  . aspirin 81 MG tablet Take 81 mg by mouth daily.        Marland Kitchen b complex vitamins tablet Take 1 tablet by mouth daily.        . Cholecalciferol (VITAMIN D) 1000 UNITS capsule Take 1,000 Units by mouth daily.        . dorzolamide-timolol (COSOPT) 22.3-6.8 MG/ML ophthalmic solution Place into both eyes as directed.      . fish oil-omega-3 fatty acids 1000 MG capsule Take 1 capsule by mouth daily.       . hydrochlorothiazide (MICROZIDE) 12.5 MG capsule TAKE ONE CAPSULE BY MOUTH EVERY DAY  90 capsule  3  . levothyroxine (SYNTHROID, LEVOTHROID) 75 MCG tablet TAKE 1 TABLET (75 MCG TOTAL) BY MOUTH DAILY.  90 tablet  1  . lisinopril (PRINIVIL,ZESTRIL) 40 MG tablet TAKE 1 TABLET BY MOUTH EVERY DAY  90 tablet  3  . metoprolol tartrate (LOPRESSOR) 25 MG tablet Take 1 tablet (25 mg total)  by mouth 2 (two) times daily.  90 tablet  3  . Multiple Vitamins-Minerals (CENTRUM) tablet Take 1 tablet by mouth daily.        Marland Kitchen omeprazole (PRILOSEC) 20 MG capsule Take 1 capsule (20 mg total) by mouth 2 (two) times daily.  180 capsule  3  . warfarin (COUMADIN) 5 MG tablet Take as directed by anticoagulation clinic  90 tablet  1    No Known Allergies  Past Medical History  Diagnosis Date  . Heart murmur   . TB (tuberculosis) 2002    positive  . Abnormal chest x-ray   . Other voice and resonance disorders   . Glaucoma   . Unspecified essential hypertension   . Unspecified hypothyroidism   . Nontoxic multinodular goiter   . Constrictive pericarditis     s/p pericardial window in 2002 in [place]  . Atrial fibrillation   . Hyperlipidemia   . CHF (congestive heart failure)   . H/O: hysterectomy 1992    Past Surgical History  Procedure Date  . Pericardial window 2002  . Abdominal hysterectomy 1992  . Radioactive iodine for hyperthyroidism/multinodular goiter     s/p    History  Smoking status  . Former Smoker  . Quit date: 10/21/1978  Smokeless tobacco  . Not on  file    History  Alcohol Use No    Family History  Problem Relation Age of Onset  . Asthma Brother   . Arthritis Mother   . Arthritis Father   . Cervical cancer Sister   . Cancer Sister     Cervical Cancer    Review of Systems: The review of systems is per the HPI.  All other systems were reviewed and are negative.  Physical Exam: BP 140/78  Pulse 69  Ht 5' 7.5" (1.715 m)  Wt 217 lb 1.9 oz (98.485 kg)  BMI 33.50 kg/m2 Patient is very pleasant and in no acute distress. Skin is warm and dry. Color is normal.  HEENT is unremarkable. Normocephalic/atraumatic. PERRL. Sclera are nonicteric. Neck is supple. No masses. No JVD. Lungs are clear. Cardiac exam shows a regular rate and rhythm. Abdomen is soft. Extremities are without edema. She does have venous stasis changes noted bilaterally. Gait and ROM  are intact. No gross neurologic deficits noted.  LABORATORY DATA: EKG today shows sinus rhythm. Nonspecific T wave changes. No actute changes.   Lab Results  Component Value Date   WBC 8.0 07/03/2010   HGB 13.1 07/03/2010   HCT 38.6 07/03/2010   PLT 221.0 07/03/2010   GLUCOSE 84 11/22/2011   CHOL 136 11/22/2011   TRIG 86.0 11/22/2011   HDL 39.70 11/22/2011   LDLDIRECT 146.4 07/03/2010   LDLCALC 79 11/22/2011   ALT 19 11/22/2011   AST 21 11/22/2011   NA 136 11/22/2011   K 3.9 11/22/2011   CL 104 11/22/2011   CREATININE 0.7 11/22/2011   BUN 11 11/22/2011   CO2 27 11/22/2011   TSH 1.23 12/19/2011   INR 2.3 07/14/2012   ECHO SUMMARY 2007  - Overall left ventricular systolic function was normal. Left ventricular ejection fraction was estimated to be 65 %. There were no left ventricular regional wall motion abnormalities. - There was mild mitral valvular regurgitation. - The right atrium was mildly dilated. - There appeared to be a trivial pericardial effusion posterior to the heart. - There is only mild MR. There is a trivial posterior pericardial effusion. IMPRESSIONS - There is only mild MR. There is a trivial posterior pericardial effusion.  Assessment / Plan: 1. Atypical chest pain - she does have HLD and HTN. Not active. Will arrange for lexiscan to further define. She is seeing Dr. Arlyce Dice next month.  2. HTN - blood pressure is reportedly better at home.  3. GERD - on PPI therapy  4. Leg pain, bilateral - pulses are somewhat diminished on exam. Will check dopplers.   5. HLD - will check labs today. I suspect she will need to restart lipid lowering agents.  6. PAF - remains on coumadin. Her EKG shows sinus today.   For now, we will see her back at her regular recall time in February with Dr. Tenny Craw unless her studies would would dictate otherwise.   Patient is agreeable to this plan and will call if any problems develop in the interim.

## 2012-07-17 NOTE — Telephone Encounter (Signed)
Message copied by Awilda Bill on Fri Jul 17, 2012  2:24 PM ------      Message from: Rosalio Macadamia      Created: Fri Jul 17, 2012  1:21 PM       Ok to report. Labs are satisfactory but potassium is a little low and lipids are not good. Would increase foods high in potassium. Would restart Crestor 10 mg a day. Recheck BMET/HPF and lipids in 6 weeks.

## 2012-07-17 NOTE — Telephone Encounter (Signed)
LMTCB

## 2012-07-17 NOTE — Telephone Encounter (Signed)
Patient notified of lab results.  She will restart the Crestor 10 mg taking one tablet daily.  She will have repeat blood work done 10/22/201.  She is having other testing done that day and Lawson Fiscal ok'd this date.  She will increase her oral potassium.

## 2012-07-28 ENCOUNTER — Ambulatory Visit (HOSPITAL_COMMUNITY): Payer: Medicare Other | Attending: Cardiology | Admitting: Radiology

## 2012-07-28 VITALS — BP 140/83 | Ht 67.0 in | Wt 218.0 lb

## 2012-07-28 DIAGNOSIS — R079 Chest pain, unspecified: Secondary | ICD-10-CM

## 2012-07-28 DIAGNOSIS — R9431 Abnormal electrocardiogram [ECG] [EKG]: Secondary | ICD-10-CM

## 2012-07-28 DIAGNOSIS — E785 Hyperlipidemia, unspecified: Secondary | ICD-10-CM

## 2012-07-28 DIAGNOSIS — M79606 Pain in leg, unspecified: Secondary | ICD-10-CM

## 2012-07-28 DIAGNOSIS — I4891 Unspecified atrial fibrillation: Secondary | ICD-10-CM

## 2012-07-28 DIAGNOSIS — R0602 Shortness of breath: Secondary | ICD-10-CM | POA: Insufficient documentation

## 2012-07-28 DIAGNOSIS — R61 Generalized hyperhidrosis: Secondary | ICD-10-CM | POA: Insufficient documentation

## 2012-07-28 DIAGNOSIS — I10 Essential (primary) hypertension: Secondary | ICD-10-CM | POA: Insufficient documentation

## 2012-07-28 DIAGNOSIS — I480 Paroxysmal atrial fibrillation: Secondary | ICD-10-CM

## 2012-07-28 MED ORDER — TECHNETIUM TC 99M SESTAMIBI GENERIC - CARDIOLITE
33.0000 | Freq: Once | INTRAVENOUS | Status: AC | PRN
  Administered 2012-07-28: 33 via INTRAVENOUS

## 2012-07-28 MED ORDER — TECHNETIUM TC 99M SESTAMIBI GENERIC - CARDIOLITE
11.0000 | Freq: Once | INTRAVENOUS | Status: AC | PRN
  Administered 2012-07-28: 11 via INTRAVENOUS

## 2012-07-28 MED ORDER — REGADENOSON 0.4 MG/5ML IV SOLN
0.4000 mg | Freq: Once | INTRAVENOUS | Status: AC
  Administered 2012-07-28: 0.4 mg via INTRAVENOUS

## 2012-07-28 NOTE — Progress Notes (Signed)
[facility] SITE 3 NUCLEAR MED [id number] [place] [address] [phone]  Cardiology Nuclear Med Study  Monica Mora is a 77 y.o. female     MRN : [id number]     DOB: [birth year]  Procedure Date: 07/28/2012  Nuclear Med Background Indication for Stress Test:  Evaluation for Ischemia and Abnormal EKG: Nonspecific T wave changes History:  '07 ECHO: EF: 65% mild MR 2 yrs ago GXT: NL per pt done here  Cardiac Risk Factors: History of Smoking, Hypertension and Lipids  Symptoms:  Chest Pain, Diaphoresis and SOB   Nuclear Pre-Procedure Caffeine/Decaff Intake:  None NPO After: 7:00pm   Lungs:  clear O2 Sat: 95% on room air. IV 0.9% NS with Angio Cath:  20g  IV Site: R Hand  IV Started by:  Cathlyn Parsons, RN  Chest Size (in):  38 Cup Size: B  Height: 5\' 7"  (1.702 m)  Weight:  218 lb (98.884 kg)  BMI:  Body mass index is 34.14 kg/(m^2). Tech Comments:  Lopressor held x 12 hrs    Nuclear Med Study 1 or 2 day study: 1 day  Stress Test Type:  Treadmill/Lexiscan  Reading MD: Olga Millers, MD  Order Authorizing Provider:  Gunnar Fusi Ross,MD  Resting Radionuclide: Technetium 69m Sestamibi  Resting Radionuclide Dose: 11.0 mCi   Stress Radionuclide:  Technetium 38m Sestamibi  Stress Radionuclide Dose: 33.0 mCi           Stress Protocol Rest HR: 59 Stress HR: 104  Rest BP: 140/83 Stress BP: 184/85  Exercise Time (min): n/a METS: n/a   Predicted Max HR: 146 bpm % Max HR: 71.23 bpm Rate Pressure Product: 62952   Dose of Adenosine (mg):  n/a Dose of Lexiscan: 0.4 mg  Dose of Atropine (mg): n/a Dose of Dobutamine: n/a mcg/kg/min (at max HR)  Stress Test Technologist: Milana Na, EMT-P  Nuclear Technologist:  Domenic Polite, CNMT     Rest Procedure:  Myocardial perfusion imaging was performed at rest 45 minutes following the intravenous administration of Technetium 85m Sestamibi. Rest ECG: Sinus Bradycardia with non-specific ST-T wave  changes  Stress Procedure:  The patient received IV Lexiscan 0.4 mg over 15-seconds with concurrent low level exercise and then Technetium 106m Sestamibi was injected at 30-seconds while the patient continued walking one more minute. There were non specific changes and no symptoms with Lexiscan. Quantitative spect images were obtained after a 45-minute delay. Stress ECG: Non Specific ST, T changes  QPS Raw Data Images:  Acquisition technically good; normal left ventricular size. Stress Images:  Normal homogeneous uptake in all areas of the myocardium. Rest Images:  Normal homogeneous uptake in all areas of the myocardium. Subtraction (SDS):  No evidence of ischemia. Transient Ischemic Dilatation (Normal <1.22):  1.14 Lung/Heart Ratio (Normal <0.45):  0.36  Quantitative Gated Spect Images QGS EDV:  70 ml QGS ESV:  23 ml  Impression Exercise Capacity:  Lexiscan with no exercise. BP Response:  Normal blood pressure response. Clinical Symptoms:  No chest pain or dyspnea. ECG Impression:  No significant ST segment change suggestive of ischemia. Comparison with Prior Nuclear Study: No images to compare  Overall Impression:  Normal stress nuclear study.  LV Ejection Fraction: 67%.  LV Wall Motion:  NL LV Function; NL Wall Motion   Olga Millers

## 2012-08-04 ENCOUNTER — Telehealth: Payer: Self-pay | Admitting: Internal Medicine

## 2012-08-04 MED ORDER — METOPROLOL SUCCINATE ER 25 MG PO TB24: 50.0000 mg | ORAL_TABLET | Freq: Every day | ORAL | Status: DC

## 2012-08-04 NOTE — Telephone Encounter (Signed)
Patient is taken Metoprolol tartrate 25 mg twice a day. She states spoke with the mail order pharmacy and was advice that it will be better if she took the Brand name medication, because it was more effective. Pt also would like to take this medication once a day instead of twice a day.

## 2012-08-04 NOTE — Telephone Encounter (Signed)
New problem:   Would like a gentric of metoprolol. Due to cost.

## 2012-08-04 NOTE — Telephone Encounter (Signed)
Topel XL 50 mg once a day called to [facility] mail service, 90 days supply and three refills. Left pt a message, pt to call back if any questions.

## 2012-08-04 NOTE — Telephone Encounter (Signed)
There is no difference.  She can take Toprol XL 50 mg 1x per day if she wants.

## 2012-08-10 ENCOUNTER — Encounter: Payer: Self-pay | Admitting: Gastroenterology

## 2012-08-10 ENCOUNTER — Ambulatory Visit (INDEPENDENT_AMBULATORY_CARE_PROVIDER_SITE_OTHER): Payer: Medicare Other | Admitting: Gastroenterology

## 2012-08-10 ENCOUNTER — Other Ambulatory Visit: Payer: Self-pay | Admitting: *Deleted

## 2012-08-10 VITALS — BP 132/70 | HR 68 | Ht 67.5 in | Wt 220.2 lb

## 2012-08-10 DIAGNOSIS — I10 Essential (primary) hypertension: Secondary | ICD-10-CM

## 2012-08-10 DIAGNOSIS — E785 Hyperlipidemia, unspecified: Secondary | ICD-10-CM

## 2012-08-10 DIAGNOSIS — I4891 Unspecified atrial fibrillation: Secondary | ICD-10-CM

## 2012-08-10 DIAGNOSIS — K219 Gastro-esophageal reflux disease without esophagitis: Secondary | ICD-10-CM

## 2012-08-10 DIAGNOSIS — R609 Edema, unspecified: Secondary | ICD-10-CM

## 2012-08-10 NOTE — Patient Instructions (Addendum)
You can try Mylanta or Maylox to help you with your symptoms It has been recommended to your by Dr Arlyce Dice that you schedule a upper endoscopy and to STAY ON COUMADIN PER DR Arlyce Dice You will need to call back to schedule at your convenience At that time you will be scheduled with a previsit nurse to sign paperwork and get instructions

## 2012-08-10 NOTE — Assessment & Plan Note (Signed)
Patient's chest pain is reproducible by bending over and is likely due to reflux.  It appears that she has an incompetent LES. On the presumption that she has had long-standing reflux Barrett's esophagus should be ruled out.  Recommendations #1 patient was instructed to take antacids prior to her morning ritual #2 upper endoscopy  Patient is on Coumadin but can continue her medication for a diagnostic endoscopy

## 2012-08-10 NOTE — Progress Notes (Signed)
History of Present Illness:  Pleasant 77 year old [place] American female referred at the request of Dr. Jonny Ruiz for evaluation of chest discomfort. In the mornings she typically gets up and prays on all fours. This predictably causes a pressure in her lower chest that radiates toward her throat causing a burning discomfort. It subsides after she gets up. Symptoms have not improved with omeprazole. She denies dysphagia or nausea. She is fine the remainder of the day.  Colonoscopy in 2012 demonstrated diverticulosis.    Past Medical History  Diagnosis Date  . Heart murmur   . TB (tuberculosis) 2002    positive  . Abnormal chest x-ray   . Other voice and resonance disorders   . Glaucoma(365)   . Unspecified essential hypertension   . Unspecified hypothyroidism   . Nontoxic multinodular goiter   . Constrictive pericarditis     s/p pericardial window in 2002 in [place]  . Atrial fibrillation   . Hyperlipidemia   . CHF (congestive heart failure)   . H/O: hysterectomy 1992   Past Surgical History  Procedure Date  . Pericardial window 2002  . Abdominal hysterectomy 1992  . Radioactive iodine for hyperthyroidism/multinodular goiter     s/p   family history includes Arthritis in her father and mother; Asthma in her brother; Cancer in her sister; and Cervical cancer in her sister. Current Outpatient Prescriptions  Medication Sig Dispense Refill  . aspirin 81 MG tablet Take 81 mg by mouth daily.        Marland Kitchen b complex vitamins tablet Take 1 tablet by mouth daily.        . Cholecalciferol (VITAMIN D) 1000 UNITS capsule Take 1,000 Units by mouth daily.        . dorzolamide-timolol (COSOPT) 22.3-6.8 MG/ML ophthalmic solution Place into both eyes as directed.      . fish oil-omega-3 fatty acids 1000 MG capsule Take 1 capsule by mouth daily.       Marland Kitchen levothyroxine (SYNTHROID, LEVOTHROID) 75 MCG tablet TAKE 1 TABLET (75 MCG TOTAL) BY MOUTH DAILY.  90 tablet  1  . lisinopril (PRINIVIL,ZESTRIL) 40 MG  tablet TAKE 1 TABLET BY MOUTH EVERY DAY  90 tablet  3  . metoprolol succinate (TOPROL XL) 25 MG 24 hr tablet Take 2 tablets (50 mg total) by mouth daily.  180 tablet  3  . Multiple Vitamins-Minerals (CENTRUM) tablet Take 1 tablet by mouth daily.        Marland Kitchen omeprazole (PRILOSEC) 20 MG capsule Take 1 capsule (20 mg total) by mouth 2 (two) times daily.  180 capsule  3  . warfarin (COUMADIN) 5 MG tablet Take as directed by anticoagulation clinic  90 tablet  1   Allergies as of 08/10/2012  . (No Known Allergies)    reports that she quit smoking about 33 years ago. She has never used smokeless tobacco. She reports that she does not drink alcohol or use illicit drugs.     Review of Systems: Pertinent positive and negative review of systems were noted in the above HPI section. All other review of systems were otherwise negative.  Vital signs were reviewed in today's medical record Physical Exam: General: Well developed , well nourished, no acute distress Head: Normocephalic and atraumatic Eyes:  sclerae anicteric, EOMI Ears: Normal auditory acuity Mouth: No deformity or lesions Neck: Supple, no masses or thyromegaly Lungs: Clear throughout to auscultation Heart: Regular rate and rhythm; no murmurs, rubs or bruits Abdomen: Soft, non tender and non distended. No masses,  hepatosplenomegaly or hernias noted. Normal Bowel sounds Rectal:deferred Musculoskeletal: Symmetrical with no gross deformities  Skin: No lesions on visible extremities Pulses:  Normal pulses noted Extremities: No clubbing, cyanosis, edema or deformities noted Neurological: Alert oriented x 4, grossly nonfocal Cervical Nodes:  No significant cervical adenopathy Inguinal Nodes: No significant inguinal adenopathy Psychological:  Alert and cooperative. Normal mood and affect

## 2012-08-11 ENCOUNTER — Ambulatory Visit (INDEPENDENT_AMBULATORY_CARE_PROVIDER_SITE_OTHER): Payer: Medicare Other | Admitting: *Deleted

## 2012-08-11 ENCOUNTER — Encounter (INDEPENDENT_AMBULATORY_CARE_PROVIDER_SITE_OTHER): Payer: Medicare Other

## 2012-08-11 ENCOUNTER — Ambulatory Visit (INDEPENDENT_AMBULATORY_CARE_PROVIDER_SITE_OTHER): Payer: Medicare Other

## 2012-08-11 DIAGNOSIS — I10 Essential (primary) hypertension: Secondary | ICD-10-CM

## 2012-08-11 DIAGNOSIS — I4891 Unspecified atrial fibrillation: Secondary | ICD-10-CM

## 2012-08-11 DIAGNOSIS — I70219 Atherosclerosis of native arteries of extremities with intermittent claudication, unspecified extremity: Secondary | ICD-10-CM

## 2012-08-11 DIAGNOSIS — R5381 Other malaise: Secondary | ICD-10-CM

## 2012-08-11 DIAGNOSIS — I480 Paroxysmal atrial fibrillation: Secondary | ICD-10-CM

## 2012-08-11 DIAGNOSIS — R079 Chest pain, unspecified: Secondary | ICD-10-CM

## 2012-08-11 DIAGNOSIS — Z7901 Long term (current) use of anticoagulants: Secondary | ICD-10-CM

## 2012-08-11 DIAGNOSIS — M79606 Pain in leg, unspecified: Secondary | ICD-10-CM

## 2012-08-11 DIAGNOSIS — R609 Edema, unspecified: Secondary | ICD-10-CM

## 2012-08-11 DIAGNOSIS — E785 Hyperlipidemia, unspecified: Secondary | ICD-10-CM

## 2012-08-11 LAB — LIPID PANEL
Cholesterol: 151 mg/dL (ref 0–200)
Triglycerides: 133 mg/dL (ref 0.0–149.0)

## 2012-08-11 LAB — HEPATIC FUNCTION PANEL
ALT: 25 U/L (ref 0–35)
AST: 25 U/L (ref 0–37)
Albumin: 3.3 g/dL — ABNORMAL LOW (ref 3.5–5.2)
Alkaline Phosphatase: 36 U/L — ABNORMAL LOW (ref 39–117)
Bilirubin, Direct: 0.1 mg/dL (ref 0.0–0.3)
Total Protein: 7.3 g/dL (ref 6.0–8.3)

## 2012-08-11 LAB — BASIC METABOLIC PANEL
BUN: 9 mg/dL (ref 6–23)
Chloride: 101 mEq/L (ref 96–112)
Creatinine, Ser: 0.8 mg/dL (ref 0.4–1.2)
GFR: 88.65 mL/min (ref >60.00)
Potassium: 3.7 mEq/L (ref 3.5–5.1)

## 2012-08-11 LAB — POCT INR: INR: 2

## 2012-08-13 ENCOUNTER — Telehealth: Payer: Self-pay | Admitting: Internal Medicine

## 2012-08-13 MED ORDER — METOPROLOL TARTRATE 25 MG PO TABS: 25.0000 mg | ORAL_TABLET | Freq: Two times a day (BID) | ORAL | Status: DC

## 2012-08-13 NOTE — Telephone Encounter (Signed)
New Problem:    Patient called in needing a refill of her metoprolol tartrate (LOPRESSOR) 25 MG tablet written out so she can get it filled at a local pharmacy today.  Patient needs to have this done while waiting on her mail order pharmacy to send her refill.  Please call back.

## 2012-08-13 NOTE — Telephone Encounter (Signed)
Sent to local pharmacy Metoprolol tartrate 25mg  2 times per day.

## 2012-08-13 NOTE — Telephone Encounter (Addendum)
Pt. wants to talk to Annice Pih, Dr. Tenny Craw' nurse, concerning switching back to brand name Toprol XL b/c she states that the difference in cost is very little. She states that when she took brand name before she was only taking 25 mg daily and when changed to Metoprolol she takes 25 mg bid. She wants RX [facility] [address]. Please advise, pt. request a call back today.

## 2012-08-13 NOTE — Telephone Encounter (Signed)
LMOM will send script to [facility].

## 2012-08-13 NOTE — Telephone Encounter (Signed)
Pt would like to talk to you regarding her medication and ordering it locally and through mail order she called earlier but I did not see message but she needs a call right away

## 2012-08-17 ENCOUNTER — Other Ambulatory Visit: Payer: Self-pay | Admitting: *Deleted

## 2012-08-17 DIAGNOSIS — E079 Disorder of thyroid, unspecified: Secondary | ICD-10-CM

## 2012-09-08 ENCOUNTER — Ambulatory Visit (INDEPENDENT_AMBULATORY_CARE_PROVIDER_SITE_OTHER): Payer: Medicare Other | Admitting: *Deleted

## 2012-09-08 ENCOUNTER — Other Ambulatory Visit: Payer: Medicare Other

## 2012-09-08 DIAGNOSIS — I4891 Unspecified atrial fibrillation: Secondary | ICD-10-CM

## 2012-09-08 DIAGNOSIS — Z7901 Long term (current) use of anticoagulants: Secondary | ICD-10-CM

## 2012-09-11 ENCOUNTER — Other Ambulatory Visit (INDEPENDENT_AMBULATORY_CARE_PROVIDER_SITE_OTHER): Payer: Medicare Other

## 2012-09-11 DIAGNOSIS — E079 Disorder of thyroid, unspecified: Secondary | ICD-10-CM

## 2012-10-15 ENCOUNTER — Ambulatory Visit (INDEPENDENT_AMBULATORY_CARE_PROVIDER_SITE_OTHER): Payer: Medicare Other | Admitting: *Deleted

## 2012-10-15 DIAGNOSIS — I4891 Unspecified atrial fibrillation: Secondary | ICD-10-CM

## 2012-10-15 DIAGNOSIS — Z7901 Long term (current) use of anticoagulants: Secondary | ICD-10-CM

## 2012-11-05 ENCOUNTER — Other Ambulatory Visit: Payer: Self-pay | Admitting: Internal Medicine

## 2012-11-26 ENCOUNTER — Ambulatory Visit (INDEPENDENT_AMBULATORY_CARE_PROVIDER_SITE_OTHER): Payer: Medicare Other | Admitting: *Deleted

## 2012-11-26 DIAGNOSIS — I4891 Unspecified atrial fibrillation: Secondary | ICD-10-CM

## 2012-11-26 DIAGNOSIS — Z7901 Long term (current) use of anticoagulants: Secondary | ICD-10-CM

## 2012-12-17 ENCOUNTER — Telehealth: Payer: Self-pay | Admitting: Internal Medicine

## 2012-12-17 NOTE — Telephone Encounter (Signed)
Pt's mail order is delayed on her toprol, uses [facility]  so she needs short term supply for 7 days,  would like a prior auth to get name brand covered under insurance [phone] as well

## 2012-12-18 ENCOUNTER — Other Ambulatory Visit: Payer: Self-pay

## 2012-12-18 MED ORDER — METOPROLOL SUCCINATE ER 50 MG PO TB24: 50.0000 mg | ORAL_TABLET | Freq: Every day | ORAL | Status: DC

## 2012-12-18 NOTE — Telephone Encounter (Signed)
Pt called 2 times wanting a refill on toprol XL 50mg  1 tab daily name brand only

## 2012-12-22 NOTE — Telephone Encounter (Signed)
Refill was sent

## 2012-12-29 ENCOUNTER — Other Ambulatory Visit: Payer: Self-pay | Admitting: Internal Medicine

## 2012-12-31 ENCOUNTER — Other Ambulatory Visit: Payer: Self-pay | Admitting: Internal Medicine

## 2013-01-01 ENCOUNTER — Other Ambulatory Visit: Payer: Self-pay | Admitting: Internal Medicine

## 2013-01-01 NOTE — Telephone Encounter (Signed)
refill 

## 2013-01-02 ENCOUNTER — Other Ambulatory Visit: Payer: Self-pay | Admitting: Internal Medicine

## 2013-01-05 ENCOUNTER — Ambulatory Visit (INDEPENDENT_AMBULATORY_CARE_PROVIDER_SITE_OTHER): Payer: Medicare Other

## 2013-01-05 ENCOUNTER — Telehealth: Payer: Self-pay

## 2013-01-05 DIAGNOSIS — Z7901 Long term (current) use of anticoagulants: Secondary | ICD-10-CM

## 2013-01-05 DIAGNOSIS — I4891 Unspecified atrial fibrillation: Secondary | ICD-10-CM

## 2013-01-05 MED ORDER — LEVOTHYROXINE SODIUM 75 MCG PO TABS: ORAL_TABLET | ORAL | Status: DC

## 2013-01-05 NOTE — Telephone Encounter (Signed)
Pt seen in Coumadin Clinic today requesting refill on Levothyroxin qd, [facility] [address].  Pt is out of medication as of yesterday refill sent to Dr Everardo All on Saturday.  Please call pt and advise.  Thanks

## 2013-01-05 NOTE — Telephone Encounter (Signed)
Rx e-scribed, please call pt for an ov

## 2013-01-05 NOTE — Addendum Note (Signed)
Addended by: Sharlyne Pacas on: 01/05/2013 11:35 AM   Modules accepted: Orders

## 2013-01-05 NOTE — Telephone Encounter (Signed)
Please refill x 1 Ov is due  

## 2013-01-19 ENCOUNTER — Ambulatory Visit (INDEPENDENT_AMBULATORY_CARE_PROVIDER_SITE_OTHER): Payer: Medicare Other | Admitting: Endocrinology

## 2013-01-19 ENCOUNTER — Encounter: Payer: Self-pay | Admitting: Endocrinology

## 2013-01-19 VITALS — BP 130/74 | HR 78 | Wt 220.0 lb

## 2013-01-19 DIAGNOSIS — E890 Postprocedural hypothyroidism: Secondary | ICD-10-CM

## 2013-01-19 DIAGNOSIS — E042 Nontoxic multinodular goiter: Secondary | ICD-10-CM

## 2013-01-19 NOTE — Progress Notes (Signed)
Subjective:    Patient ID: Monica Mora, female    DOB: 05/23/1935, 78 y.o.   MRN: [id number]  HPI Pt had i-131 rx for hyperthyroidism, due to a goiter with just one small nodule, in 2008. She has been on synthroid since soon thereafter.  She has been on the same dosage (75 mcg/day) for several years.  She sees dr Tenny Craw for AF.  pt states she feels well in general.  Last [place] in 2009 was unchanged. Past Medical History  Diagnosis Date  . Heart murmur   . TB (tuberculosis) 2002    positive  . Abnormal chest x-ray   . Other voice and resonance disorders   . Glaucoma(365)   . Unspecified essential hypertension   . Unspecified hypothyroidism   . Nontoxic multinodular goiter   . Constrictive pericarditis     s/p pericardial window in 2002 in [place]  . Atrial fibrillation   . Hyperlipidemia   . CHF (congestive heart failure)   . H/O: hysterectomy 1992    Past Surgical History  Procedure Laterality Date  . Pericardial window  2002  . Abdominal hysterectomy  1992  . Radioactive iodine for hyperthyroidism/multinodular goiter      s/p    History   Social History  . Marital Status: Single    Spouse Name: N/A    Number of Children: 0  . Years of Education: N/A   Occupational History  . Retired     Paramedic work  .     Social History Main Topics  . Smoking status: Former Smoker    Quit date: 10/21/1978  . Smokeless tobacco: Never Used  . Alcohol Use: No  . Drug Use: No  . Sexually Active: Not Currently   Other Topics Concern  . Not on file   Social History Narrative   Daily Caffeine Use: 1 daily    Current Outpatient Prescriptions on File Prior to Visit  Medication Sig Dispense Refill  . aspirin 81 MG tablet Take 81 mg by mouth daily.        Marland Kitchen b complex vitamins tablet Take 1 tablet by mouth daily.        . Cholecalciferol (VITAMIN D) 1000 UNITS capsule Take 1,000 Units by mouth daily.        . dorzolamide-timolol (COSOPT) 22.3-6.8 MG/ML ophthalmic solution  Place into both eyes as directed.      . fish oil-omega-3 fatty acids 1000 MG capsule Take 1 capsule by mouth daily.       . hydrochlorothiazide (MICROZIDE) 12.5 MG capsule TAKE ONE CAPSULE BY MOUTH EVERY DAY  90 capsule  3  . levothyroxine (SYNTHROID, LEVOTHROID) 75 MCG tablet TAKE 1 TABLET (75 MCG TOTAL) BY MOUTH DAILY. Office visit required for further refills  30 tablet  0  . lisinopril (PRINIVIL,ZESTRIL) 40 MG tablet TAKE 1 TABLET BY MOUTH EVERY DAY  90 tablet  0  . metoprolol succinate (TOPROL-XL) 50 MG 24 hr tablet Take 1 tablet (50 mg total) by mouth daily. BRAND NAME ONLY  30 tablet  0  . metoprolol tartrate (LOPRESSOR) 25 MG tablet Take 1 tablet (25 mg total) by mouth 2 (two) times daily.  60 tablet  11  . Multiple Vitamins-Minerals (CENTRUM) tablet Take 1 tablet by mouth daily.        Marland Kitchen omeprazole (PRILOSEC) 20 MG capsule Take 1 capsule (20 mg total) by mouth 2 (two) times daily.  180 capsule  3  . omeprazole (PRILOSEC) 20 MG capsule TAKE  1 CAPSULE (20 MG TOTAL) BY MOUTH DAILY.  90 capsule  0  . omeprazole (PRILOSEC) 20 MG capsule TAKE 1 CAPSULE (20 MG TOTAL) BY MOUTH DAILY.  90 capsule  0  . warfarin (COUMADIN) 5 MG tablet Take by mouth as directed  by anticoagulation clinic  90 tablet  1   No current facility-administered medications on file prior to visit.    No Known Allergies  Family History  Problem Relation Age of Onset  . Asthma Brother   . Arthritis Mother   . Arthritis Father   . Cervical cancer Sister   . Cancer Sister     Cervical Cancer    BP 130/74  Pulse 78  Wt 220 lb (99.791 kg)  BMI 33.93 kg/m2  SpO2 98%  Review of Systems Denies weight change    Objective:   Physical Exam VITAL SIGNS:  See vs page GENERAL: no distress Neck: right thyroid lobe is slightly enlarged.  The left lobe is normal   Lab Results  Component Value Date   TSH 6.23* 08/11/2012      Assessment & Plan:  Nodular goiter, ? Any change Post-i-131 hypothyroidism, on rx

## 2013-01-19 NOTE — Patient Instructions (Addendum)
A thyroid blood test, and another ultrasound, are being requested for you today.  We'll contact you with results. Please return in 1 year.

## 2013-01-21 ENCOUNTER — Other Ambulatory Visit (INDEPENDENT_AMBULATORY_CARE_PROVIDER_SITE_OTHER): Payer: Medicare Other

## 2013-01-21 DIAGNOSIS — E890 Postprocedural hypothyroidism: Secondary | ICD-10-CM

## 2013-01-22 ENCOUNTER — Other Ambulatory Visit: Payer: Self-pay

## 2013-01-27 ENCOUNTER — Ambulatory Visit
Admission: RE | Admit: 2013-01-27 | Discharge: 2013-01-27 | Disposition: A | Discharge status: Home / Self Care | Payer: Medicare Other | Source: Ambulatory Visit | Attending: Endocrinology | Admitting: Endocrinology

## 2013-02-01 ENCOUNTER — Other Ambulatory Visit: Payer: Self-pay | Admitting: Endocrinology

## 2013-02-01 ENCOUNTER — Other Ambulatory Visit: Payer: Self-pay | Admitting: *Deleted

## 2013-02-01 MED ORDER — LEVOTHYROXINE SODIUM 75 MCG PO TABS: ORAL_TABLET | ORAL | Status: DC

## 2013-02-19 ENCOUNTER — Ambulatory Visit (INDEPENDENT_AMBULATORY_CARE_PROVIDER_SITE_OTHER): Payer: Medicare Other | Admitting: *Deleted

## 2013-02-19 DIAGNOSIS — Z7901 Long term (current) use of anticoagulants: Secondary | ICD-10-CM

## 2013-02-19 DIAGNOSIS — I4891 Unspecified atrial fibrillation: Secondary | ICD-10-CM

## 2013-03-16 ENCOUNTER — Ambulatory Visit (INDEPENDENT_AMBULATORY_CARE_PROVIDER_SITE_OTHER): Payer: Medicare Other | Admitting: *Deleted

## 2013-03-16 DIAGNOSIS — Z7901 Long term (current) use of anticoagulants: Secondary | ICD-10-CM

## 2013-03-16 DIAGNOSIS — I4891 Unspecified atrial fibrillation: Secondary | ICD-10-CM

## 2013-04-02 ENCOUNTER — Other Ambulatory Visit: Payer: Self-pay | Admitting: Internal Medicine

## 2013-04-13 ENCOUNTER — Ambulatory Visit (INDEPENDENT_AMBULATORY_CARE_PROVIDER_SITE_OTHER): Payer: Medicare Other | Admitting: *Deleted

## 2013-04-13 DIAGNOSIS — Z7901 Long term (current) use of anticoagulants: Secondary | ICD-10-CM

## 2013-04-13 DIAGNOSIS — I4891 Unspecified atrial fibrillation: Secondary | ICD-10-CM

## 2013-04-27 ENCOUNTER — Ambulatory Visit (INDEPENDENT_AMBULATORY_CARE_PROVIDER_SITE_OTHER): Payer: Medicare Other | Admitting: Pharmacist

## 2013-04-27 DIAGNOSIS — Z7901 Long term (current) use of anticoagulants: Secondary | ICD-10-CM

## 2013-04-27 DIAGNOSIS — I4891 Unspecified atrial fibrillation: Secondary | ICD-10-CM

## 2013-05-18 ENCOUNTER — Ambulatory Visit (INDEPENDENT_AMBULATORY_CARE_PROVIDER_SITE_OTHER): Payer: Medicare Other | Admitting: Internal Medicine

## 2013-05-18 ENCOUNTER — Encounter: Payer: Self-pay | Admitting: Internal Medicine

## 2013-05-18 VITALS — BP 109/70 | HR 65 | Temp 98.1°F | Ht 67.5 in | Wt 218.4 lb

## 2013-05-18 DIAGNOSIS — K5289 Other specified noninfective gastroenteritis and colitis: Secondary | ICD-10-CM

## 2013-05-18 DIAGNOSIS — I10 Essential (primary) hypertension: Secondary | ICD-10-CM

## 2013-05-18 DIAGNOSIS — B009 Herpesviral infection, unspecified: Secondary | ICD-10-CM

## 2013-05-18 DIAGNOSIS — B001 Herpesviral vesicular dermatitis: Secondary | ICD-10-CM

## 2013-05-18 DIAGNOSIS — K529 Noninfective gastroenteritis and colitis, unspecified: Secondary | ICD-10-CM

## 2013-05-18 DIAGNOSIS — Z0000 Encounter for general adult medical examination without abnormal findings: Secondary | ICD-10-CM

## 2013-05-18 NOTE — Patient Instructions (Addendum)
Please continue all other medications as before, and refills have been done if requested. OK to use immodium in the future for diarrhea if needed OK to hold the HCTZ fluid pill in the future for a few days if you start to become dehydrated in a similar situation Please continue all other medications as before, and refills have been done if requested. Please have the pharmacy call with any other refills you may need.  Please remember to sign up for My Chart if you have not done so, as this will be important to you in the future with finding out test results, communicating by private email, and scheduling acute appointments online when needed.  Please return in 1 month for your yearly visit, or sooner if needed, with Lab testing done 3-5 days before

## 2013-05-18 NOTE — Progress Notes (Signed)
Subjective:    Patient ID: Monica Mora, female    DOB: 01-14-1935, 78 y.o.   MRN: [id number]  HPI  Here with c/o 4 days onset feverish, general weakness and malaise, n/v, crampy abd pains and loose watery stools, no blood all improved this AM, after a recent trip with other persons, a few ill as well.  Was wary of OTC meds so did not use immodium or other.  Has a small fever blister to the left lower lip with some mild discomfort.  Pt denies chest pain, increased sob or doe, wheezing, orthopnea, PND, increased LE swelling, palpitations, dizziness or syncope.   Pt denies polydipsia, polyuria, Pt denies new neurological symptoms such as new headache, or facial or extremity weakness or numbness Past Medical History  Diagnosis Date  . Heart murmur   . TB (tuberculosis) 2002    positive  . Abnormal chest x-ray   . Other voice and resonance disorders   . Glaucoma   . Unspecified essential hypertension   . Unspecified hypothyroidism   . Nontoxic multinodular goiter   . Constrictive pericarditis     s/p pericardial window in 2002 in [place]  . Atrial fibrillation   . Hyperlipidemia   . CHF (congestive heart failure)   . H/O: hysterectomy 1992   Past Surgical History  Procedure Laterality Date  . Pericardial window  2002  . Abdominal hysterectomy  1992  . Radioactive iodine for hyperthyroidism/multinodular goiter      s/p    reports that she quit smoking about 34 years ago. She has never used smokeless tobacco. She reports that she does not drink alcohol or use illicit drugs. family history includes Arthritis in her father and mother; Asthma in her brother; Cancer in her sister; and Cervical cancer in her sister. No Known Allergies Current Outpatient Prescriptions on File Prior to Visit  Medication Sig Dispense Refill  . aspirin 81 MG tablet Take 81 mg by mouth daily.        Marland Kitchen b complex vitamins tablet Take 1 tablet by mouth daily.        . Cholecalciferol (VITAMIN D) 1000 UNITS  capsule Take 1,000 Units by mouth daily.        . dorzolamide-timolol (COSOPT) 22.3-6.8 MG/ML ophthalmic solution Place into both eyes as directed.      . fish oil-omega-3 fatty acids 1000 MG capsule Take 1 capsule by mouth daily.       . hydrochlorothiazide (MICROZIDE) 12.5 MG capsule TAKE ONE CAPSULE BY MOUTH EVERY DAY  90 capsule  3  . levothyroxine (SYNTHROID, LEVOTHROID) 75 MCG tablet TAKE 1 TABLET (75 MCG TOTAL) BY MOUTH DAILY. OFFICE VISIT REQUIRED FOR FURTHER REFILLS  30 tablet  0  . levothyroxine (SYNTHROID, LEVOTHROID) 75 MCG tablet TAKE 1 TABLET (75 MCG TOTAL) BY MOUTH DAILY.  30 tablet  4  . lisinopril (PRINIVIL,ZESTRIL) 40 MG tablet TAKE 1 TABLET BY MOUTH EVERY DAY  90 tablet  0  . metoprolol succinate (TOPROL-XL) 50 MG 24 hr tablet Take 1 tablet (50 mg total) by mouth daily. BRAND NAME ONLY  30 tablet  0  . metoprolol tartrate (LOPRESSOR) 25 MG tablet Take 1 tablet (25 mg total) by mouth 2 (two) times daily.  60 tablet  11  . Multiple Vitamins-Minerals (CENTRUM) tablet Take 1 tablet by mouth daily.        Marland Kitchen omeprazole (PRILOSEC) 20 MG capsule TAKE 1 CAPSULE (20 MG TOTAL) BY MOUTH DAILY.  90 capsule  0  .  omeprazole (PRILOSEC) 20 MG capsule TAKE 1 CAPSULE (20 MG TOTAL) BY MOUTH DAILY.  90 capsule  0  . warfarin (COUMADIN) 5 MG tablet Take by mouth as directed  by anticoagulation clinic  90 tablet  1  . omeprazole (PRILOSEC) 20 MG capsule Take 1 capsule (20 mg total) by mouth 2 (two) times daily.  180 capsule  3   No current facility-administered medications on file prior to visit.   Review of Systems  Constitutional: Negative for unexpected weight change, or unusual diaphoresis  HENT: Negative for tinnitus.   Eyes: Negative for photophobia and visual disturbance.  Respiratory: Negative for choking and stridor.   Gastrointestinal: Negative for vomiting and blood in stool.  Genitourinary: Negative for hematuria and decreased urine volume.  Musculoskeletal: Negative for acute joint  swelling Skin: Negative for color change and wound.  Neurological: Negative for tremors and numbness other than noted  Psychiatric/Behavioral: Negative for decreased concentration or  hyperactivity.       Objective:   Physical Exam BP 109/70  Pulse 65  Temp(Src) 98.1 F (36.7 C) (Oral)  Ht 5' 7.5" (1.715 m)  Wt 218 lb 6 oz (99.054 kg)  BMI 33.68 kg/m2  SpO2 91% VS noted, mild ill Constitutional: Pt appears well-developed and well-nourished.  HENT: Head: NCAT.  Right Ear: External ear normal.  Left Ear: External ear normal.  Eyes: Conjunctivae and EOM are normal. Pupils are equal, round, and reactive to light.  Neck: Normal range of motion. Neck supple.  Cardiovascular: Normal rate and regular rhythm.   Pulmonary/Chest: Effort normal and breath sounds normal.  Abd:  Soft, NT, non-distended, + BS Neurological: Pt is alert. Not confused  Skin: Skin is warm. No erythema. Herpetic fever blister noted left lower lip  Psychiatric: Pt behavior is normal. Thought content normal.     Assessment & Plan:

## 2013-05-18 NOTE — Assessment & Plan Note (Signed)
C/w prob noroviral like infection, improved, for immodium prn in future,  to f/u any worsening symptoms or concerns

## 2013-05-18 NOTE — Assessment & Plan Note (Signed)
stable overall by history and exam, recent data reviewed with pt, and pt to continue medical treatment as before,  to f/u any worsening symptoms or concerns BP Readings from Last 3 Encounters:  05/18/13 109/70  01/19/13 130/74  08/10/12 132/70

## 2013-05-18 NOTE — Assessment & Plan Note (Signed)
Ok for [employer] prn,  to f/u any worsening symptoms or concerns

## 2013-05-26 ENCOUNTER — Ambulatory Visit (INDEPENDENT_AMBULATORY_CARE_PROVIDER_SITE_OTHER): Payer: Medicare Other | Admitting: *Deleted

## 2013-05-26 ENCOUNTER — Ambulatory Visit (INDEPENDENT_AMBULATORY_CARE_PROVIDER_SITE_OTHER): Payer: Medicare Other | Admitting: Pharmacist

## 2013-05-26 VITALS — BP 130/78

## 2013-05-26 DIAGNOSIS — Z7901 Long term (current) use of anticoagulants: Secondary | ICD-10-CM

## 2013-05-26 DIAGNOSIS — I4891 Unspecified atrial fibrillation: Secondary | ICD-10-CM

## 2013-05-26 DIAGNOSIS — Z79899 Other long term (current) drug therapy: Secondary | ICD-10-CM

## 2013-05-26 LAB — POCT INR: INR: 2.1

## 2013-05-26 NOTE — Progress Notes (Signed)
Pt seen today for medication review.  She brought all of her medications in with her.  Her list was updated to what she is currently taking.    Her medications were reviewed for compliance.  Pt reports taking all of her prescription medications on a regular basis other than Crestor.  She only takes this a few times a month.  Her last cholesterol panel in October 2013 was at goal nd she is due to have another check next week.  She is not consistent with taking her vitamins, especially her MVI and fish oil.    No medication interactions noted.  Pt does not report any side effects or adverse reactions to any medication.  She is taking all generic medications and cost is not an issue.    Reviewed problem list with medication list.   Pt on ASA and Coumadin.  No history of CAD.  She was placed on ASA in 2002 after diagnosed with pericarditis.    Current Outpatient Prescriptions  Medication Sig Dispense Refill  . aspirin 81 MG tablet Take 81 mg by mouth daily.        Marland Kitchen b complex vitamins tablet Take 1 tablet by mouth daily.        . Cholecalciferol (VITAMIN D) 1000 UNITS capsule Take 1,000 Units by mouth daily.        . dorzolamide-timolol (COSOPT) 22.3-6.8 MG/ML ophthalmic solution Place into both eyes as directed.      . fish oil-omega-3 fatty acids 1000 MG capsule Take 1 capsule by mouth daily.       . hydrochlorothiazide (MICROZIDE) 12.5 MG capsule TAKE ONE CAPSULE BY MOUTH EVERY DAY  90 capsule  3  . levothyroxine (SYNTHROID, LEVOTHROID) 75 MCG tablet TAKE 1 TABLET (75 MCG TOTAL) BY MOUTH DAILY. OFFICE VISIT REQUIRED FOR FURTHER REFILLS  30 tablet  0  . lisinopril (PRINIVIL,ZESTRIL) 40 MG tablet TAKE 1 TABLET BY MOUTH EVERY DAY  90 tablet  0  . metoprolol succinate (TOPROL-XL) 50 MG 24 hr tablet Take 1 tablet (50 mg total) by mouth daily. BRAND NAME ONLY  30 tablet  0  . rosuvastatin (CRESTOR) 10 MG tablet Take 10 mg by mouth daily.      Marland Kitchen warfarin (COUMADIN) 5 MG tablet Take by mouth as directed   by anticoagulation clinic  90 tablet  1  . Multiple Vitamins-Minerals (CENTRUM) tablet Take 1 tablet by mouth daily.         No current facility-administered medications for this visit.    Assessment and Plan 1.  ASA and Coumadin- ? Continued need for ASA given pericarditis occurred in 2002.  Pt also on Coumadin so increased risk of bleeding.  Will discuss with Dr. Tenny Craw to see if this can be discontinued.    2. Fish Oil- Pt not compliant with taking fish oil due to the size of the pills.  Last lipid panel was at goal.  She is due to have another check next week.  If TG at goal, informed pt okay to stop fish oil since she does not take it on a regular basis.  3.  Compliance with Crestor-  Pt non-compliant but last LDL at goal.  Will need to follow up with PCP after labs drawn.

## 2013-05-26 NOTE — Patient Instructions (Signed)
When you get the results of your cholesterol, if your triglycerides are okay, you can stop taking the fish oil.   I will talk with Dr. Tenny Craw about stopping Aspirin since you are on Coumadin

## 2013-05-27 ENCOUNTER — Telehealth: Payer: Self-pay | Admitting: *Deleted

## 2013-05-27 NOTE — Telephone Encounter (Signed)
LMTCB  Pt can stop aspirin per Dr. Waldon Merl RN

## 2013-05-28 NOTE — Telephone Encounter (Signed)
Pt aware she can stop aspirin. She has not taken it today. Mylo Red RN

## 2013-06-02 ENCOUNTER — Other Ambulatory Visit (INDEPENDENT_AMBULATORY_CARE_PROVIDER_SITE_OTHER): Payer: Medicare Other

## 2013-06-02 DIAGNOSIS — Z0000 Encounter for general adult medical examination without abnormal findings: Secondary | ICD-10-CM

## 2013-06-02 DIAGNOSIS — I10 Essential (primary) hypertension: Secondary | ICD-10-CM

## 2013-06-02 DIAGNOSIS — E785 Hyperlipidemia, unspecified: Secondary | ICD-10-CM

## 2013-06-02 LAB — CBC WITH DIFFERENTIAL/PLATELET
Basophils Absolute: 0 10*3/uL (ref 0.0–0.1)
Hemoglobin: 12.9 g/dL (ref 12.0–15.0)
Lymphocytes Relative: 31.6 % (ref 12.0–46.0)
Monocytes Relative: 7.6 % (ref 3.0–12.0)
Neutro Abs: 5 10*3/uL (ref 1.4–7.7)
Neutrophils Relative %: 59 % (ref 43.0–77.0)
Platelets: 249 10*3/uL (ref 150.0–400.0)
RDW: 14.8 % — ABNORMAL HIGH (ref 11.5–14.6)

## 2013-06-02 LAB — BASIC METABOLIC PANEL
CO2: 26 mEq/L (ref 19–32)
Calcium: 9.2 mg/dL (ref 8.4–10.5)
Glucose, Bld: 76 mg/dL (ref 70–99)
Sodium: 133 mEq/L — ABNORMAL LOW (ref 135–145)

## 2013-06-02 LAB — HEPATIC FUNCTION PANEL
Albumin: 3.5 g/dL (ref 3.5–5.2)
Alkaline Phosphatase: 36 U/L — ABNORMAL LOW (ref 39–117)
Total Protein: 7.5 g/dL (ref 6.0–8.3)

## 2013-06-02 LAB — LIPID PANEL
HDL: 34.2 mg/dL — ABNORMAL LOW (ref >39.00)
Triglycerides: 131 mg/dL (ref 0.0–149.0)

## 2013-06-02 LAB — URINALYSIS, ROUTINE W REFLEX MICROSCOPIC
Nitrite: NEGATIVE
Specific Gravity, Urine: 1.01 (ref 1.000–1.030)
pH: 7.5 (ref 5.0–8.0)

## 2013-06-03 ENCOUNTER — Telehealth: Payer: Self-pay

## 2013-06-03 NOTE — Telephone Encounter (Signed)
A user error has taken place: encounter opened in error, closed for administrative reasons.

## 2013-06-04 ENCOUNTER — Encounter: Payer: Medicare Other | Admitting: Internal Medicine

## 2013-06-14 ENCOUNTER — Encounter: Payer: Self-pay | Admitting: Internal Medicine

## 2013-06-14 ENCOUNTER — Ambulatory Visit (INDEPENDENT_AMBULATORY_CARE_PROVIDER_SITE_OTHER): Payer: Medicare Other | Admitting: Internal Medicine

## 2013-06-14 VITALS — BP 120/86 | HR 59 | Temp 97.6°F | Ht 67.0 in | Wt 218.5 lb

## 2013-06-14 DIAGNOSIS — Z0000 Encounter for general adult medical examination without abnormal findings: Secondary | ICD-10-CM

## 2013-06-14 NOTE — Progress Notes (Signed)
Subjective:    Patient ID: Monica Mora, female    DOB: Apr 12, 1935, 78 y.o.   MRN: [id number]  HPI   Here for wellness and f/u;  Overall doing ok;  Pt denies CP, worsening SOB, DOE, wheezing, orthopnea, PND, worsening LE edema, palpitations, dizziness or syncope.  Pt denies neurological change such as new headache, facial or extremity weakness.  Pt denies polydipsia, polyuria, or low sugar symptoms. Pt states overall good compliance with treatment and medications, good tolerability, and has been trying to follow lower cholesterol diet.  Pt denies worsening depressive symptoms, suicidal ideation or panic. No fever, night sweats, wt loss, loss of appetite, or other constitutional symptoms.  Pt states good ability with ADL's, has low fall risk, home safety reviewed and adequate, no other significant changes in hearing or vision, and only occasionally active with exercise. No acute complaints. GI symptoms and fever blister last visit resolved.  Declines immunizations Past Medical History  Diagnosis Date  . Heart murmur   . TB (tuberculosis) 2002    positive  . Abnormal chest x-ray   . Other voice and resonance disorders   . Glaucoma   . Unspecified essential hypertension   . Unspecified hypothyroidism   . Nontoxic multinodular goiter   . Constrictive pericarditis     s/p pericardial window in 2002 in [place]  . Atrial fibrillation   . Hyperlipidemia   . CHF (congestive heart failure)   . H/O: hysterectomy 1992   Past Surgical History  Procedure Laterality Date  . Pericardial window  2002  . Abdominal hysterectomy  1992  . Radioactive iodine for hyperthyroidism/multinodular goiter      s/p    reports that she quit smoking about 34 years ago. She has never used smokeless tobacco. She reports that she does not drink alcohol or use illicit drugs. family history includes Arthritis in her father and mother; Asthma in her brother; Cancer in her sister; Cervical cancer in her sister. No  Known Allergies Current Outpatient Prescriptions on File Prior to Visit  Medication Sig Dispense Refill  . b complex vitamins tablet Take 1 tablet by mouth daily.        . Cholecalciferol (VITAMIN D) 1000 UNITS capsule Take 1,000 Units by mouth daily.        . dorzolamide-timolol (COSOPT) 22.3-6.8 MG/ML ophthalmic solution Place into both eyes as directed.      . fish oil-omega-3 fatty acids 1000 MG capsule Take 1 capsule by mouth daily.       . hydrochlorothiazide (MICROZIDE) 12.5 MG capsule TAKE ONE CAPSULE BY MOUTH EVERY DAY  90 capsule  3  . levothyroxine (SYNTHROID, LEVOTHROID) 75 MCG tablet TAKE 1 TABLET (75 MCG TOTAL) BY MOUTH DAILY. OFFICE VISIT REQUIRED FOR FURTHER REFILLS  30 tablet  0  . lisinopril (PRINIVIL,ZESTRIL) 40 MG tablet TAKE 1 TABLET BY MOUTH EVERY DAY  90 tablet  0  . metoprolol succinate (TOPROL-XL) 50 MG 24 hr tablet Take 1 tablet (50 mg total) by mouth daily. BRAND NAME ONLY  30 tablet  0  . Multiple Vitamins-Minerals (CENTRUM) tablet Take 1 tablet by mouth daily.        . rosuvastatin (CRESTOR) 10 MG tablet Take 10 mg by mouth daily.      Marland Kitchen warfarin (COUMADIN) 5 MG tablet Take by mouth as directed  by anticoagulation clinic  90 tablet  1   No current facility-administered medications on file prior to visit.   Review of Systems Constitutional: Negative for diaphoresis,  activity change, appetite change or unexpected weight change.  HENT: Negative for hearing loss, ear pain, facial swelling, mouth sores and neck stiffness.   Eyes: Negative for pain, redness and visual disturbance.  Respiratory: Negative for shortness of breath and wheezing.   Cardiovascular: Negative for chest pain and palpitations.  Gastrointestinal: Negative for diarrhea, blood in stool, abdominal distention or other pain Genitourinary: Negative for hematuria, flank pain or change in urine volume.  Musculoskeletal: Negative for myalgias and joint swelling.  Skin: Negative for color change and wound.   Neurological: Negative for syncope and numbness. other than noted Hematological: Negative for adenopathy.  Psychiatric/Behavioral: Negative for hallucinations, self-injury, decreased concentration and agitation.      Objective:   Physical Exam BP 120/86  Pulse 59  Temp(Src) 97.6 F (36.4 C) (Oral)  Ht 5\' 7"  (1.702 m)  Wt 218 lb 8 oz (99.111 kg)  BMI 34.21 kg/m2  SpO2 95% VS noted,  Constitutional: Pt is oriented to person, place, and time. Appears well-developed and well-nourished.  Head: Normocephalic and atraumatic.  Right Ear: External ear normal.  Left Ear: External ear normal.  Nose: Nose normal.  Mouth/Throat: Oropharynx is clear and moist.  Eyes: Conjunctivae and EOM are normal. Pupils are equal, round, and reactive to light.  Neck: Normal range of motion. Neck supple. No JVD present. No tracheal deviation present.  Cardiovascular: Normal rate, regular rhythm, normal heart sounds and intact distal pulses.   Pulmonary/Chest: Effort normal and breath sounds normal.  Abdominal: Soft. Bowel sounds are normal. There is no tenderness. No HSM  Musculoskeletal: Normal range of motion. Exhibits no edema.  Lymphadenopathy:  Has no cervical adenopathy.  Neurological: Pt is alert and oriented to person, place, and time. Pt has normal reflexes. No cranial nerve deficit.  Skin: Skin is warm and dry. No rash noted.  Psychiatric:  Has  normal mood and affect. Behavior is normal.     Assessment & Plan:

## 2013-06-14 NOTE — Assessment & Plan Note (Signed)

## 2013-06-14 NOTE — Patient Instructions (Signed)
Please continue all other medications as before, and refills have been done if requested. You are given the lab work results today Please have the pharmacy call with any other refills you may need. Please continue your efforts at being more active, low cholesterol diet, and weight control. Please take your choleseterol medication every day You are otherwise up to date with prevention measures today.  Please remember to sign up for My Chart if you have not done so, as this will be important to you in the future with finding out test results, communicating by private email, and scheduling acute appointments online when needed.  Please return in 1 year for your yearly visit, or sooner if needed, with Lab testing done 3-5 days before

## 2013-06-29 ENCOUNTER — Ambulatory Visit (INDEPENDENT_AMBULATORY_CARE_PROVIDER_SITE_OTHER): Payer: Medicare Other | Admitting: *Deleted

## 2013-06-29 DIAGNOSIS — Z7901 Long term (current) use of anticoagulants: Secondary | ICD-10-CM

## 2013-06-29 DIAGNOSIS — I4891 Unspecified atrial fibrillation: Secondary | ICD-10-CM

## 2013-07-03 ENCOUNTER — Other Ambulatory Visit: Payer: Self-pay | Admitting: Internal Medicine

## 2013-07-03 ENCOUNTER — Other Ambulatory Visit: Payer: Self-pay | Admitting: Endocrinology

## 2013-07-05 NOTE — Telephone Encounter (Signed)
Refill done.  

## 2013-07-20 ENCOUNTER — Other Ambulatory Visit: Payer: Self-pay | Admitting: Internal Medicine

## 2013-08-10 ENCOUNTER — Ambulatory Visit (INDEPENDENT_AMBULATORY_CARE_PROVIDER_SITE_OTHER): Payer: Medicare Other | Admitting: *Deleted

## 2013-08-10 ENCOUNTER — Encounter (INDEPENDENT_AMBULATORY_CARE_PROVIDER_SITE_OTHER): Payer: Self-pay

## 2013-08-10 DIAGNOSIS — Z7901 Long term (current) use of anticoagulants: Secondary | ICD-10-CM

## 2013-08-10 DIAGNOSIS — I4891 Unspecified atrial fibrillation: Secondary | ICD-10-CM

## 2013-08-31 ENCOUNTER — Ambulatory Visit (INDEPENDENT_AMBULATORY_CARE_PROVIDER_SITE_OTHER): Payer: Medicare Other | Admitting: General Practice

## 2013-08-31 DIAGNOSIS — Z7901 Long term (current) use of anticoagulants: Secondary | ICD-10-CM

## 2013-08-31 DIAGNOSIS — I4891 Unspecified atrial fibrillation: Secondary | ICD-10-CM

## 2013-08-31 LAB — POCT INR: INR: 3.9

## 2013-09-21 ENCOUNTER — Ambulatory Visit (INDEPENDENT_AMBULATORY_CARE_PROVIDER_SITE_OTHER): Payer: Medicare Other | Admitting: *Deleted

## 2013-09-21 DIAGNOSIS — Z7901 Long term (current) use of anticoagulants: Secondary | ICD-10-CM

## 2013-09-21 DIAGNOSIS — I4891 Unspecified atrial fibrillation: Secondary | ICD-10-CM

## 2013-09-21 LAB — POCT INR: INR: 2.2

## 2013-10-20 ENCOUNTER — Ambulatory Visit (INDEPENDENT_AMBULATORY_CARE_PROVIDER_SITE_OTHER): Payer: Medicare Other | Admitting: *Deleted

## 2013-10-20 DIAGNOSIS — I4891 Unspecified atrial fibrillation: Secondary | ICD-10-CM

## 2013-10-20 DIAGNOSIS — Z7901 Long term (current) use of anticoagulants: Secondary | ICD-10-CM

## 2013-10-29 ENCOUNTER — Other Ambulatory Visit: Payer: Self-pay | Admitting: *Deleted

## 2013-10-29 MED ORDER — METOPROLOL SUCCINATE ER 50 MG PO TB24: 50.0000 mg | ORAL_TABLET | Freq: Every day | ORAL | Status: DC

## 2013-11-02 ENCOUNTER — Other Ambulatory Visit: Payer: Self-pay

## 2013-11-02 MED ORDER — METOPROLOL SUCCINATE ER 50 MG PO TB24: 50.0000 mg | ORAL_TABLET | Freq: Every day | ORAL | Status: DC

## 2013-11-08 ENCOUNTER — Encounter: Payer: Self-pay | Admitting: Internal Medicine

## 2013-11-08 ENCOUNTER — Encounter (INDEPENDENT_AMBULATORY_CARE_PROVIDER_SITE_OTHER): Payer: Self-pay

## 2013-11-08 ENCOUNTER — Ambulatory Visit (INDEPENDENT_AMBULATORY_CARE_PROVIDER_SITE_OTHER): Payer: Medicare Other | Admitting: Internal Medicine

## 2013-11-08 VITALS — BP 134/75 | HR 56 | Ht 67.0 in | Wt 218.0 lb

## 2013-11-08 DIAGNOSIS — I10 Essential (primary) hypertension: Secondary | ICD-10-CM

## 2013-11-08 DIAGNOSIS — I480 Paroxysmal atrial fibrillation: Secondary | ICD-10-CM

## 2013-11-08 DIAGNOSIS — I4891 Unspecified atrial fibrillation: Secondary | ICD-10-CM

## 2013-11-08 DIAGNOSIS — E785 Hyperlipidemia, unspecified: Secondary | ICD-10-CM

## 2013-11-08 MED ORDER — METOPROLOL SUCCINATE ER 50 MG PO TB24: 50.0000 mg | ORAL_TABLET | Freq: Every day | ORAL | Status: DC

## 2013-11-08 NOTE — Progress Notes (Signed)
Marland Kitchen HPI Patient is a 79 year old with a history of PAF and reported constrictive pericarditis though history suggests simple pericarditis with window placement.  No records available.  I saw her spring 2013 SInce seen the patinet has done well  Denies palpitations  Breathing is OK  No CP   Current Outpatient Prescriptions  Medication Sig Dispense Refill  . b complex vitamins tablet Take 1 tablet by mouth daily.        . Cholecalciferol (VITAMIN D) 1000 UNITS capsule Take 1,000 Units by mouth daily.        . dorzolamide-timolol (COSOPT) 22.3-6.8 MG/ML ophthalmic solution Place into both eyes as directed.      . fish oil-omega-3 fatty acids 1000 MG capsule Take 1 capsule by mouth daily.       . hydrochlorothiazide (MICROZIDE) 12.5 MG capsule TAKE ONE CAPSULE BY MOUTH EVERY DAY  90 capsule  3  . levothyroxine (SYNTHROID, LEVOTHROID) 75 MCG tablet TAKE 1 TABLET (75 MCG TOTAL) BY MOUTH DAILY. OFFICE VISIT REQUIRED FOR FURTHER REFILLS  30 tablet  0  . lisinopril (PRINIVIL,ZESTRIL) 40 MG tablet TAKE 1 TABLET BY MOUTH EVERY DAY  90 tablet  1  . metoprolol succinate (TOPROL-XL) 50 MG 24 hr tablet Take 1 tablet (50 mg total) by mouth daily. BRAND NAME ONLY  90 tablet  3  . Multiple Vitamins-Minerals (CENTRUM) tablet Take 1 tablet by mouth daily.        . rosuvastatin (CRESTOR) 10 MG tablet Take 10 mg by mouth daily.      Marland Kitchen warfarin (COUMADIN) 5 MG tablet Take by mouth as directed   by anticoagulation clinic  90 tablet  1   No current facility-administered medications for this visit.    Past Medical History  Diagnosis Date  . Heart murmur   . TB (tuberculosis) 2002    positive  . Abnormal chest x-ray   . Other voice and resonance disorders   . Glaucoma   . Unspecified essential hypertension   . Unspecified hypothyroidism   . Nontoxic multinodular goiter   . Constrictive pericarditis     s/p pericardial window in 2002 in [place]  . Atrial fibrillation   . Hyperlipidemia   . CHF (congestive  heart failure)   . H/O: hysterectomy 1992    Past Surgical History  Procedure Laterality Date  . Pericardial window  2002  . Abdominal hysterectomy  1992  . Radioactive iodine for hyperthyroidism/multinodular goiter      s/p    Family History  Problem Relation Age of Onset  . Asthma Brother   . Arthritis Mother   . Arthritis Father   . Cervical cancer Sister   . Cancer Sister     Cervical Cancer    History   Social History  . Marital Status: Single    Spouse Name: N/A    Number of Children: 0  . Years of Education: N/A   Occupational History  . Retired     Marketing executive work  .     Social History Main Topics  . Smoking status: Former Smoker    Quit date: 10/21/1978  . Smokeless tobacco: Never Used  . Alcohol Use: No  . Drug Use: No  . Sexual Activity: Not Currently   Other Topics Concern  . Not on file   Social History Narrative   Daily Caffeine Use: 1 daily    Review of Systems:  All systems reviewed.  They are negative to the above problem except  as previously stated.  Vital Signs: BP 134/75  Pulse 56  Ht 5\' 7"  (1.702 m)  Wt 218 lb (98.884 kg)  BMI 34.14 kg/m2  Physical Exam Patient is in NAD. HEENT:  Normocephalic, atraumatic. EOMI, PERRLA.  Neck: JVP is normal. No thyromegaly. No bruits.  Lungs: clear to auscultation. No rales no wheezes.  Heart: Regular rate and rhythm. Normal S1, S2. No S3.   No significant murmurs. PMI not displaced.  Abdomen:  Supple, nontender. Normal bowel sounds. No masses. No hepatomegaly.  Extremities:   Good distal pulses throughout. No lower extremity edema.  Musculoskeletal :moving all extremities.  Neuro:   alert and oriented x3.  CN II-XII grossly intact.  EKG:  Sinus bradycardia.  56 bpm.    Septal infarct.  Nonspecific ST T wave changes.  Assessment and Plan:  1.  HTN  Adequate control   2.  PAF  Denies palpitations.  Continue anticoagulation 3.  HL  LDL in AUgust was 126  Patient admits to not taking Crestor.   NO history of CAD  Encouraged her to watch her diet  Should recheck this spring to reeval  4.  Hx pericarditis.  S/p window  Patient asymptomatic.

## 2013-11-08 NOTE — Patient Instructions (Signed)
Your physician wants you to follow-up in: 1 year  You will receive a reminder letter in the mail two months in advance. If you don't receive a letter, please call our office to schedule the follow-up appointment.  Your physician recommends that you continue on your current medications as directed. Please refer to the Current Medication list given to you today.  

## 2013-11-10 ENCOUNTER — Telehealth: Payer: Self-pay | Admitting: *Deleted

## 2013-11-10 DIAGNOSIS — E785 Hyperlipidemia, unspecified: Secondary | ICD-10-CM

## 2013-11-10 NOTE — Telephone Encounter (Signed)
Referring Provider     Fay Records, MD    Patinet has appt at end of Jan for coumadin Set up for lipids Tell her to not eat after 8 AM    Left message for pt of dr Harrington Challenger recommendation. She will call to get the appt moved up to keep from having to fast until 1:30p

## 2013-11-11 ENCOUNTER — Telehealth: Payer: Self-pay | Admitting: Internal Medicine

## 2013-11-11 NOTE — Telephone Encounter (Signed)
New message     Will do blood work on Tuesday 1-27 since she will be here for coumadin clinic

## 2013-11-11 NOTE — Telephone Encounter (Signed)
Left message for pt, aware as long as she eats prior to 8 am she will be fine for labs. appt made.

## 2013-11-16 ENCOUNTER — Telehealth: Payer: Self-pay | Admitting: *Deleted

## 2013-11-16 ENCOUNTER — Ambulatory Visit (INDEPENDENT_AMBULATORY_CARE_PROVIDER_SITE_OTHER): Payer: Medicare Other | Admitting: *Deleted

## 2013-11-16 ENCOUNTER — Other Ambulatory Visit (INDEPENDENT_AMBULATORY_CARE_PROVIDER_SITE_OTHER): Payer: Medicare Other

## 2013-11-16 DIAGNOSIS — E785 Hyperlipidemia, unspecified: Secondary | ICD-10-CM

## 2013-11-16 DIAGNOSIS — Z7901 Long term (current) use of anticoagulants: Secondary | ICD-10-CM

## 2013-11-16 DIAGNOSIS — I4891 Unspecified atrial fibrillation: Secondary | ICD-10-CM

## 2013-11-16 LAB — LIPID PANEL
Cholesterol: 166 mg/dL (ref 0–200)
HDL: 36.7 mg/dL — AB (ref >39.00)
LDL Cholesterol: 101 mg/dL — ABNORMAL HIGH (ref 0–99)
TRIGLYCERIDES: 140 mg/dL (ref 0.0–149.0)
Total CHOL/HDL Ratio: 5
VLDL: 28 mg/dL (ref 0.0–40.0)

## 2013-11-16 LAB — POCT INR: INR: 2.3

## 2013-11-16 NOTE — Telephone Encounter (Signed)
[facility] approved toprol XL through 10/20/2014

## 2013-11-16 NOTE — Telephone Encounter (Signed)
Pt here for [facility] appointment and ask to speak to me. The pt takes toprol brand only needs prior auth. Called [employer] and toprol, brand only approved through the end of the year. Pt made aware.

## 2013-11-17 ENCOUNTER — Encounter: Payer: Self-pay | Admitting: *Deleted

## 2013-11-25 ENCOUNTER — Other Ambulatory Visit: Payer: Self-pay | Admitting: *Deleted

## 2013-11-25 MED ORDER — WARFARIN SODIUM 5 MG PO TABS: 5.0000 mg | ORAL_TABLET | ORAL | Status: DC

## 2013-11-26 ENCOUNTER — Telehealth: Payer: Self-pay | Admitting: Internal Medicine

## 2013-12-06 ENCOUNTER — Other Ambulatory Visit: Payer: Self-pay | Admitting: Endocrinology

## 2013-12-14 ENCOUNTER — Ambulatory Visit (INDEPENDENT_AMBULATORY_CARE_PROVIDER_SITE_OTHER): Payer: Medicare Other | Admitting: Pharmacist

## 2013-12-14 DIAGNOSIS — Z7901 Long term (current) use of anticoagulants: Secondary | ICD-10-CM

## 2013-12-14 DIAGNOSIS — I4891 Unspecified atrial fibrillation: Secondary | ICD-10-CM

## 2013-12-14 LAB — POCT INR: INR: 2.2

## 2013-12-29 ENCOUNTER — Encounter: Payer: Self-pay | Admitting: Internal Medicine

## 2013-12-29 ENCOUNTER — Ambulatory Visit (INDEPENDENT_AMBULATORY_CARE_PROVIDER_SITE_OTHER): Payer: Medicare Other | Admitting: Internal Medicine

## 2013-12-29 VITALS — BP 120/70 | HR 75 | Temp 97.0°F | Wt 221.5 lb

## 2013-12-29 DIAGNOSIS — I4891 Unspecified atrial fibrillation: Secondary | ICD-10-CM

## 2013-12-29 DIAGNOSIS — J209 Acute bronchitis, unspecified: Secondary | ICD-10-CM

## 2013-12-29 DIAGNOSIS — I10 Essential (primary) hypertension: Secondary | ICD-10-CM

## 2013-12-29 MED ORDER — HYDROCODONE-HOMATROPINE 5-1.5 MG/5ML PO SYRP: 5.0000 mL | ORAL_SOLUTION | Freq: Four times a day (QID) | ORAL | Status: DC | PRN

## 2013-12-29 MED ORDER — AZITHROMYCIN 250 MG PO TABS: ORAL_TABLET | ORAL | Status: DC

## 2013-12-29 NOTE — Assessment & Plan Note (Signed)
stable overall by history and exam, and pt to continue medical treatment as before,  to f/u any worsening symptoms or concerns 

## 2013-12-29 NOTE — Patient Instructions (Signed)
Please take all new medication as prescribed - the antibiotic, and cough medicine  Please continue all other medications as before, and refills have been done if requested. Please have the pharmacy call with any other refills you may need.  Please remember to sign up for MyChart if you have not done so, as this will be important to you in the future with finding out test results, communicating by private email, and scheduling acute appointments online when needed.

## 2013-12-29 NOTE — Progress Notes (Signed)
Subjective:    Patient ID: Monica Mora, female    DOB: 1935-05-02, 79 y.o.   MRN: [id number]  HPI  Here with acute onset mild to mod 2-3 days ST, HA, general weakness and malaise, with prod cough greenish sputum, but Pt denies chest pain, increased sob or doe, wheezing, orthopnea, PND, increased LE swelling, palpitations, dizziness or syncope.   Pt denies polydipsia, polyuria Past Medical History  Diagnosis Date  . Heart murmur   . TB (tuberculosis) 2002    positive  . Abnormal chest x-ray   . Other voice and resonance disorders   . Glaucoma   . Unspecified essential hypertension   . Unspecified hypothyroidism   . Nontoxic multinodular goiter   . Constrictive pericarditis     s/p pericardial window in 2002 in [place]  . Atrial fibrillation   . Hyperlipidemia   . CHF (congestive heart failure)   . H/O: hysterectomy 1992   Past Surgical History  Procedure Laterality Date  . Pericardial window  2002  . Abdominal hysterectomy  1992  . Radioactive iodine for hyperthyroidism/multinodular goiter      s/p    reports that she quit smoking about 35 years ago. She has never used smokeless tobacco. She reports that she does not drink alcohol or use illicit drugs. family history includes Arthritis in her father and mother; Asthma in her brother; Cancer in her sister; Cervical cancer in her sister. No Known Allergies Current Outpatient Prescriptions on File Prior to Visit  Medication Sig Dispense Refill  . b complex vitamins tablet Take 1 tablet by mouth daily.        . Cholecalciferol (VITAMIN D) 1000 UNITS capsule Take 1,000 Units by mouth daily.        . dorzolamide-timolol (COSOPT) 22.3-6.8 MG/ML ophthalmic solution Place into both eyes as directed.      . fish oil-omega-3 fatty acids 1000 MG capsule Take 1 capsule by mouth daily.       . hydrochlorothiazide (MICROZIDE) 12.5 MG capsule TAKE ONE CAPSULE BY MOUTH EVERY DAY  90 capsule  3  . levothyroxine (SYNTHROID,  LEVOTHROID) 75 MCG tablet TAKE 1 TABLET (75 MCG TOTAL) BY MOUTH DAILY. OFFICE VISIT REQUIRED FOR FURTHER REFILLS  30 tablet  0  . levothyroxine (SYNTHROID, LEVOTHROID) 75 MCG tablet TAKE 1 TABLET (75 MCG TOTAL) BY MOUTH DAILY.  30 tablet  1  . lisinopril (PRINIVIL,ZESTRIL) 40 MG tablet TAKE 1 TABLET BY MOUTH EVERY DAY  90 tablet  1  . metoprolol succinate (TOPROL-XL) 50 MG 24 hr tablet Take 1 tablet (50 mg total) by mouth daily. BRAND NAME ONLY  90 tablet  3  . Multiple Vitamins-Minerals (CENTRUM) tablet Take 1 tablet by mouth daily.        . rosuvastatin (CRESTOR) 10 MG tablet Take 10 mg by mouth daily.      Marland Kitchen warfarin (COUMADIN) 5 MG tablet Take 1 tablet (5 mg total) by mouth as directed.  90 tablet  1   No current facility-administered medications on file prior to visit.     Review of Systems  Constitutional: Negative for unexpected weight change, or unusual diaphoresis  HENT: Negative for tinnitus.   Eyes: Negative for photophobia and visual disturbance.  Respiratory: Negative for choking and stridor.   Gastrointestinal: Negative for vomiting and blood in stool.  Genitourinary: Negative for hematuria and decreased urine volume.  Musculoskeletal: Negative for acute joint swelling Skin: Negative for color change and wound.  Neurological: Negative for tremors  and numbness other than noted  Psychiatric/Behavioral: Negative for decreased concentration or  hyperactivity.       Objective:   Physical Exam BP 120/70  Pulse 75  Temp(Src) 97 F (36.1 C) (Oral)  Wt 221 lb 8 oz (100.472 kg)  SpO2 93% VS noted, mild ill Constitutional: Pt appears well-developed and well-nourished.  HENT: Head: NCAT.  Right Ear: External ear normal.  Left Ear: External ear normal.  Eyes: Conjunctivae and EOM are normal. Pupils are equal, round, and reactive to light.  Bilat tm's with mild erythema.  Max sinus areas non tender.  Pharynx with mild erythema, no exudate Neck: Normal range of motion. Neck  supple.  Cardiovascular: Normal rate and regular rhythm.   Pulmonary/Chest: Effort normal and breath sounds normal.  - no rales or wheezing Neurological: Pt is alert. Not confused  Skin: Skin is warm. No erythema.  Psychiatric: Pt behavior is normal. Thought content normal.         Assessment & Plan:

## 2013-12-29 NOTE — Assessment & Plan Note (Signed)
stable overall by history and exam, recent data reviewed with pt, and pt to continue medical treatment as before,  to f/u any worsening symptoms or concerns BP Readings from Last 3 Encounters:  12/29/13 120/70  11/08/13 134/75  06/14/13 120/86

## 2013-12-29 NOTE — Progress Notes (Signed)
Pre visit review using our clinic review tool, if applicable. No additional management support is needed unless otherwise documented below in the visit note. 

## 2013-12-29 NOTE — Assessment & Plan Note (Signed)
Mild to mod, for antibx course,  to f/u any worsening symptoms or concerns 

## 2014-01-16 ENCOUNTER — Other Ambulatory Visit: Payer: Self-pay | Admitting: Internal Medicine

## 2014-01-25 ENCOUNTER — Ambulatory Visit (INDEPENDENT_AMBULATORY_CARE_PROVIDER_SITE_OTHER): Payer: Medicare Other | Admitting: Pharmacist Clinician (PhC)/ Clinical Pharmacy Specialist

## 2014-01-25 DIAGNOSIS — I4891 Unspecified atrial fibrillation: Secondary | ICD-10-CM

## 2014-01-25 DIAGNOSIS — Z7901 Long term (current) use of anticoagulants: Secondary | ICD-10-CM

## 2014-01-25 LAB — POCT INR: INR: 1.4

## 2014-02-08 ENCOUNTER — Ambulatory Visit (INDEPENDENT_AMBULATORY_CARE_PROVIDER_SITE_OTHER): Payer: Medicare Other | Admitting: *Deleted

## 2014-02-08 DIAGNOSIS — I4891 Unspecified atrial fibrillation: Secondary | ICD-10-CM

## 2014-02-08 DIAGNOSIS — Z7901 Long term (current) use of anticoagulants: Secondary | ICD-10-CM

## 2014-02-08 LAB — POCT INR: INR: 2.2

## 2014-02-10 ENCOUNTER — Ambulatory Visit (INDEPENDENT_AMBULATORY_CARE_PROVIDER_SITE_OTHER): Payer: Medicare Other | Admitting: Endocrinology

## 2014-02-10 ENCOUNTER — Encounter: Payer: Self-pay | Admitting: Endocrinology

## 2014-02-10 VITALS — BP 120/84 | HR 71 | Temp 97.5°F | Ht 67.0 in | Wt 221.0 lb

## 2014-02-10 DIAGNOSIS — E890 Postprocedural hypothyroidism: Secondary | ICD-10-CM

## 2014-02-10 LAB — TSH: TSH: 0.5 u[IU]/mL (ref 0.35–5.50)

## 2014-02-10 MED ORDER — LEVOTHYROXINE SODIUM 75 MCG PO TABS: ORAL_TABLET | ORAL | Status: DC

## 2014-02-10 NOTE — Progress Notes (Signed)
Subjective:    Patient ID: Monica Mora, female    DOB: [birth year], 79 y.o.   MRN: [id number]  HPI Pt had i-131 rx for hyperthyroidism, due to a goiter with just one small nodule, in 2008. She has been on synthroid since soon thereafter.  She has been on the same dosage (75 mcg/day) for several years.  She sees dr Harrington Challenger for AF.  pt states she feels well in general.  Last [place] in 2014 was unchanged.  pt states she feels well in general.   She has few months of the right middle finger "locking up," but no assoc pain. Past Medical History  Diagnosis Date  . Heart murmur   . TB (tuberculosis) 2002    positive  . Abnormal chest x-ray   . Other voice and resonance disorders   . Glaucoma   . Unspecified essential hypertension   . Unspecified hypothyroidism   . Nontoxic multinodular goiter   . Constrictive pericarditis     s/p pericardial window in 2002 in [place]  . Atrial fibrillation   . Hyperlipidemia   . CHF (congestive heart failure)   . H/O: hysterectomy 1992    Past Surgical History  Procedure Laterality Date  . Pericardial window  2002  . Abdominal hysterectomy  1992  . Radioactive iodine for hyperthyroidism/multinodular goiter      s/p    History   Social History  . Marital Status: Single    Spouse Name: N/A    Number of Children: 0  . Years of Education: N/A   Occupational History  . Retired     Marketing executive work  .     Social History Main Topics  . Smoking status: Former Smoker    Quit date: 10/21/1978  . Smokeless tobacco: Never Used  . Alcohol Use: No  . Drug Use: No  . Sexual Activity: Not Currently   Other Topics Concern  . Not on file   Social History Narrative   Daily Caffeine Use: 1 daily    Current Outpatient Prescriptions on File Prior to Visit  Medication Sig Dispense Refill  . azithromycin (ZITHROMAX Z-PAK) 250 MG tablet Use as directed  6 each  1  . b complex vitamins tablet Take 1 tablet by mouth daily.        . Cholecalciferol  (VITAMIN D) 1000 UNITS capsule Take 1,000 Units by mouth daily.        . dorzolamide-timolol (COSOPT) 22.3-6.8 MG/ML ophthalmic solution Place into both eyes as directed.      . fish oil-omega-3 fatty acids 1000 MG capsule Take 1 capsule by mouth daily.       . hydrochlorothiazide (MICROZIDE) 12.5 MG capsule TAKE ONE CAPSULE BY MOUTH EVERY DAY  90 capsule  3  . HYDROcodone-homatropine (HYCODAN) 5-1.5 MG/5ML syrup Take 5 mLs by mouth every 6 (six) hours as needed for cough.  180 mL  0  . lisinopril (PRINIVIL,ZESTRIL) 40 MG tablet TAKE 1 TABLET BY MOUTH EVERY DAY  90 tablet  3  . metoprolol succinate (TOPROL-XL) 50 MG 24 hr tablet Take 1 tablet (50 mg total) by mouth daily. BRAND NAME ONLY  90 tablet  3  . Multiple Vitamins-Minerals (CENTRUM) tablet Take 1 tablet by mouth daily.        . rosuvastatin (CRESTOR) 10 MG tablet Take 10 mg by mouth daily.      Marland Kitchen warfarin (COUMADIN) 5 MG tablet Take 1 tablet (5 mg total) by mouth as directed.  [place]  tablet  1   No current facility-administered medications on file prior to visit.    No Known Allergies  Family History  Problem Relation Age of Onset  . Asthma Brother   . Arthritis Mother   . Arthritis Father   . Cervical cancer Sister   . Cancer Sister     Cervical Cancer    BP 120/84  Pulse 71  Temp(Src) 97.5 F (36.4 C) (Oral)  Ht 5\' 7"  (1.702 m)  Wt 221 lb (100.245 kg)  BMI 34.61 kg/m2  SpO2 94%  Review of Systems Denies weight change and neck pain    Objective:   Physical Exam VITAL SIGNS:  See vs page GENERAL: no distress Neck: i think i can feel a slight left sided nodule, but I can't be certain.   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

## 2014-02-10 NOTE — Patient Instructions (Addendum)
blood tests are being requested for you today.  We'll contact you with results. Please return in 1 year.  Dr Tamala Julian at [facility] can help your finger.  Please feel free to request an appointment there.

## 2014-03-16 ENCOUNTER — Ambulatory Visit (INDEPENDENT_AMBULATORY_CARE_PROVIDER_SITE_OTHER): Payer: Medicare Other | Admitting: Surgery

## 2014-03-16 DIAGNOSIS — Z7901 Long term (current) use of anticoagulants: Secondary | ICD-10-CM

## 2014-03-16 DIAGNOSIS — I4891 Unspecified atrial fibrillation: Secondary | ICD-10-CM

## 2014-03-16 LAB — POCT INR: INR: 2.1

## 2014-03-30 ENCOUNTER — Ambulatory Visit (INDEPENDENT_AMBULATORY_CARE_PROVIDER_SITE_OTHER): Payer: Medicare Other | Admitting: Family Medicine

## 2014-03-30 ENCOUNTER — Encounter: Payer: Self-pay | Admitting: Family Medicine

## 2014-03-30 VITALS — BP 118/78 | HR 71 | Ht 67.5 in | Wt 219.0 lb

## 2014-03-30 DIAGNOSIS — M6530 Trigger finger, unspecified finger: Secondary | ICD-10-CM

## 2014-03-30 MED ORDER — DICLOFENAC SODIUM 2 % TD SOLN: 2.0000 "application " | Freq: Two times a day (BID) | TRANSDERMAL | Status: DC

## 2014-03-30 NOTE — Patient Instructions (Addendum)
Very nice to meet you.  Ice 20 minutes daily  Message whenever you can.  Wear the brace at night.  Try the pennsaid up to 2 times daily. Stop if it hurts your stomach or make INR go up Look at handout.  See me again in 4 weeks if not perfect.

## 2014-03-30 NOTE — Progress Notes (Signed)
  [facility] [address], [place] [address] Phone: [phone] Subjective:    I'm seeing this patient by the request  of:  Cathlean Cower, MD   CC: right middle finger pain and popping.   [id number] Monica Mora is a 79 y.o. female coming in with complaint of right middle finger pain and popping. Patient states that this been going on for multiple months if not years. Patient states that in the morning when she wakes up she has to use her other hand to lift her finger  patient states when she starts doing more activity it seems to get better for quite some time. She states in position too long she can have a snapping sensation in. Patient states more of just the snap they can be concerning and the pain seems to go away quickly. Patient denies any numbness any swelling or any weakness. Patient denies any type of injury. Patient rates the severity of 3/10. Patient really wants to know what it is more than what to do about.     Past medical history, social, surgical and family history all reviewed in electronic medical record.   Review of Systems: No headache, visual changes, nausea, vomiting, diarrhea, constipation, dizziness, abdominal pain, skin rash, fevers, chills, night sweats, weight loss, swollen lymph nodes, body aches, joint swelling, muscle aches, chest pain, shortness of breath, mood changes.   Objective Blood pressure 118/78, pulse 71, height 5' 7.5" (1.715 m), weight 219 lb (99.338 kg), SpO2 97.00%.  General: No apparent distress alert and oriented x3 mood and affect normal, dressed appropriately.  HEENT: Pupils equal, extraocular movements intact  Respiratory: Patient's speak in full sentences and does not appear short of breath  Cardiovascular: No lower extremity edema, non tender, no erythema  Skin: Warm dry intact with no signs of infection or rash on extremities or on axial skeleton.  Abdomen: Soft nontender  Neuro: Cranial nerves  II through XII are intact, neurovascularly intact in all extremities with 2+ DTRs and 2+ pulses.  Lymph: No lymphadenopathy of posterior or anterior cervical chain or axillae bilaterally.  Gait normal with good balance and coordination.  MSK:  Non tender with full range of motion and good stability and symmetric strength and tone of shoulders, elbows, wrist, hip, knee and ankles bilaterally.  Hand exam shows the patient does have a nodule on the right finger, middle, at the A1 pulley. This is minimally tender to palpation. Unable to cause any triggering today. Neurovascularly intact distally.   Impression and Recommendations:     This case required medical decision making of moderate complexity.

## 2014-03-30 NOTE — Assessment & Plan Note (Signed)
Patient does have what appears to be a trigger finger of the middle finger. At this point it seems to be mild. Patient is going to try conservative therapy with icing, massage, topical anti-inflammatories, as well as splinting at night. Patient was given a splint today and showed proper sizing. If patient has any worsening pain or if the locking seems to become worse the patient will come back for further evaluation. At that time I would consider doing an ultrasound and likely injection.

## 2014-04-13 ENCOUNTER — Ambulatory Visit (INDEPENDENT_AMBULATORY_CARE_PROVIDER_SITE_OTHER): Payer: Medicare Other | Admitting: *Deleted

## 2014-04-13 DIAGNOSIS — I4891 Unspecified atrial fibrillation: Secondary | ICD-10-CM

## 2014-04-13 DIAGNOSIS — Z7901 Long term (current) use of anticoagulants: Secondary | ICD-10-CM

## 2014-04-13 LAB — POCT INR: INR: 2.1

## 2014-04-15 ENCOUNTER — Encounter: Payer: Self-pay | Admitting: Internal Medicine

## 2014-04-15 ENCOUNTER — Ambulatory Visit (INDEPENDENT_AMBULATORY_CARE_PROVIDER_SITE_OTHER): Payer: Medicare Other | Admitting: Internal Medicine

## 2014-04-15 VITALS — BP 122/69 | HR 63 | Temp 98.0°F | Wt 217.0 lb

## 2014-04-15 DIAGNOSIS — B029 Zoster without complications: Secondary | ICD-10-CM

## 2014-04-15 MED ORDER — VALACYCLOVIR HCL 1 G PO TABS: 1000.0000 mg | ORAL_TABLET | Freq: Three times a day (TID) | ORAL | Status: DC

## 2014-04-15 NOTE — Progress Notes (Signed)
Subjective:    Patient ID: Monica Mora, female    DOB: [birth year], 79 y.o.   MRN: [id number]  DOS:  04/15/2014 Type of  Visit: acute History: Developed a rash 3 days ago, no itching but  swollen and painful. Thinks she had a insect bite   ROS Denies fever or chills No eye involvment, vision is normal. No  ear pain.   Past Medical History  Diagnosis Date  . Heart murmur   . TB (tuberculosis) 2002    positive  . Abnormal chest x-ray   . Other voice and resonance disorders   . Glaucoma   . Unspecified essential hypertension   . Unspecified hypothyroidism   . Nontoxic multinodular goiter   . Constrictive pericarditis     s/p pericardial window in 2002 in [place]  . Atrial fibrillation   . Hyperlipidemia   . CHF (congestive heart failure)   . H/O: hysterectomy 1992    Past Surgical History  Procedure Laterality Date  . Pericardial window  2002  . Abdominal hysterectomy  1992  . Radioactive iodine for hyperthyroidism/multinodular goiter      s/p    History   Social History  . Marital Status: Single    Spouse Name: N/A    Number of Children: 0  . Years of Education: N/A   Occupational History  . Retired     Marketing executive work  .     Social History Main Topics  . Smoking status: Former Smoker    Quit date: 10/21/1978  . Smokeless tobacco: Never Used  . Alcohol Use: No  . Drug Use: No  . Sexual Activity: Not Currently   Other Topics Concern  . Not on file   Social History Narrative   Daily Caffeine Use: 1 daily        Medication List       This list is accurate as of: 04/15/14 11:59 PM.  Always use your most recent med list.               ALPHAGAN P 0.1 % Soln  Generic drug:  brimonidine     b complex vitamins tablet  Take 1 tablet by mouth daily.     CENTRUM tablet  Take 1 tablet by mouth daily.     Diclofenac Sodium 2 % Soln  Commonly known as:  PENNSAID  Place 2 application onto the skin 2 (two) times daily.     dorzolamide-timolol 22.3-6.8 MG/ML ophthalmic solution  Commonly known as:  COSOPT  Place into both eyes as directed.     fish oil-omega-3 fatty acids 1000 MG capsule  Take 1 capsule by mouth daily.     hydrochlorothiazide 12.5 MG capsule  Commonly known as:  MICROZIDE  TAKE ONE CAPSULE BY MOUTH EVERY DAY     levothyroxine 75 MCG tablet  Commonly known as:  SYNTHROID, LEVOTHROID  TAKE 1 TABLET (75 MCG TOTAL) BY MOUTH DAILY.     lisinopril 40 MG tablet  Commonly known as:  PRINIVIL,ZESTRIL  TAKE 1 TABLET BY MOUTH EVERY DAY     LUMIGAN 0.01 % Soln  Generic drug:  bimatoprost     metoprolol succinate 50 MG 24 hr tablet  Commonly known as:  TOPROL-XL  Take 1 tablet (50 mg total) by mouth daily. BRAND NAME ONLY     valACYclovir 1000 MG tablet  Commonly known as:  VALTREX  Take 1 tablet (1,000 mg total) by mouth 3 (three) times daily.  Vitamin D 1000 UNITS capsule  Take 1,000 Units by mouth daily.     warfarin 5 MG tablet  Commonly known as:  COUMADIN  Take 1 tablet (5 mg total) by mouth as directed.           Objective:   Physical Exam  Neck:     BP 122/69  Pulse 63  Temp(Src) 98 F (36.7 C)  Wt 217 lb (98.431 kg)  SpO2 94%  General -- alert, well-developed, NAD.  Neck --no  LAD HEENT-- Not pale. Eyes not red, no d/c  TMs normal, throat symmetric, no redness or discharge. Face symmetric, sinuses not tender to palpation. Nose not congested. Ear cannal free of rash  Neurologic--  alert & oriented X3. Speech normal, gait appropriate for age, strength symmetric and appropriate for age.  EOMI, PERLA   Psych-- Cognition and judgment appear intact. Cooperative with normal attention span and concentration. No anxious or depressed appearing.       Assessment & Plan:   Shingles, Symptoms most likely is due to to shingles in the C2-C3 distribution. Explained the patient what shingles  is doing and reasoning behind the diagnosis. She knows she is  contagious. Will prescribe Valtrex, see instructions

## 2014-04-15 NOTE — Progress Notes (Signed)
Pre visit review using our clinic review tool, if applicable. No additional management support is needed unless otherwise documented below in the visit note. 

## 2014-04-15 NOTE — Patient Instructions (Signed)
Take Valtrex as prescribed Call if the rash gets a lot worse or is not improving in the next few days You are contagious as long as you have been rash If you have any eye problems ---> need to let me know immediately      Shingles Shingles (herpes zoster) is an infection that is caused by the same virus that causes chickenpox (varicella). The infection causes a painful skin rash and fluid-filled blisters, which eventually break open, crust over, and heal. It may occur in any area of the body, but it usually affects only one side of the body or face. The pain of shingles usually lasts about 1 month. However, some people with shingles may develop long-term (chronic) pain in the affected area of the body. Shingles often occurs many years after the person had chickenpox. It is more common:  In people older than 50 years.  In people with weakened immune systems, such as those with HIV, AIDS, or cancer.  In people taking medicines that weaken the immune system, such as transplant medicines.  In people under great stress. CAUSES  Shingles is caused by the varicella zoster virus (VZV), which also causes chickenpox. After a person is infected with the virus, it can remain in the person's body for years in an inactive state (dormant). To cause shingles, the virus reactivates and breaks out as an infection in a nerve root. The virus can be spread from person to person (contagious) through contact with open blisters of the shingles rash. It will only spread to people who have not had chickenpox. When these people are exposed to the virus, they may develop chickenpox. They will not develop shingles. Once the blisters scab over, the person is no longer contagious and cannot spread the virus to others. SYMPTOMS  Shingles shows up in stages. The initial symptoms may be pain, itching, and tingling in an area of the skin. This pain is usually described as burning, stabbing, or throbbing.In a few days or  weeks, a painful red rash will appear in the area where the pain, itching, and tingling were felt. The rash is usually on one side of the body in a band or belt-like pattern. Then, the rash usually turns into fluid-filled blisters. They will scab over and dry up in approximately 2-3 weeks. Flu-like symptoms may also occur with the initial symptoms, the rash, or the blisters. These may include:  Fever.  Chills.  Headache.  Upset stomach. DIAGNOSIS  Your caregiver will perform a skin exam to diagnose shingles. Skin scrapings or fluid samples may also be taken from the blisters. This sample will be examined under a microscope or sent to a lab for further testing. TREATMENT  There is no specific cure for shingles. Your caregiver will likely prescribe medicines to help you manage the pain, recover faster, and avoid long-term problems. This may include antiviral drugs, anti-inflammatory drugs, and pain medicines. HOME CARE INSTRUCTIONS   Take a cool bath or apply cool compresses to the area of the rash or blisters as directed. This may help with the pain and itching.   Only take over-the-counter or prescription medicines as directed by your caregiver.   Rest as directed by your caregiver.  Keep your rash and blisters clean with mild soap and cool water or as directed by your caregiver.  Do not pick your blisters or scratch your rash. Apply an anti-itch cream or numbing creams to the affected area as directed by your caregiver.  Keep your shingles  rash covered with a loose bandage (dressing).  Avoid skin contact with:  Babies.   Pregnant women.   Children with eczema.   Elderly people with transplants.   People with chronic illnesses, such as leukemia or AIDS.   Wear loose-fitting clothing to help ease the pain of material rubbing against the rash.  Keep all follow-up appointments with your caregiver.If the area involved is on your face, you may receive a referral for  follow-up to a specialist, such as an eye doctor (ophthalmologist) or an ear, nose, and throat (ENT) doctor. Keeping all follow-up appointments will help you avoid eye complications, chronic pain, or disability.  SEEK IMMEDIATE MEDICAL CARE IF:   You have facial pain, pain around the eye area, or loss of feeling on one side of your face.  You have ear pain or ringing in your ear.  You have loss of taste.  Your pain is not relieved with prescribed medicines.   Your redness or swelling spreads.   You have more pain and swelling.  Your condition is worsening or has changed.   You have a feveror persistent symptoms for more than 2-3 days.  You have a fever and your symptoms suddenly get worse. MAKE SURE YOU:  Understand these instructions.  Will watch your condition.  Will get help right away if you are not doing well or get worse. Document Released: 10/07/2005 Document Revised: 07/01/2012 Document Reviewed: 05/21/2012 [facility] Patient Information 2015 [place], [place]. This information is not intended to replace advice given to you by your health care provider. Make sure you discuss any questions you have with your health care provider.

## 2014-04-19 ENCOUNTER — Encounter: Payer: Self-pay | Admitting: Internal Medicine

## 2014-04-19 ENCOUNTER — Ambulatory Visit (INDEPENDENT_AMBULATORY_CARE_PROVIDER_SITE_OTHER): Payer: Medicare Other | Admitting: Internal Medicine

## 2014-04-19 VITALS — BP 106/78 | HR 69 | Temp 98.0°F | Wt 216.1 lb

## 2014-04-19 DIAGNOSIS — B029 Zoster without complications: Secondary | ICD-10-CM

## 2014-04-19 DIAGNOSIS — Z23 Encounter for immunization: Secondary | ICD-10-CM

## 2014-04-19 NOTE — Progress Notes (Signed)
Pre visit review using our clinic review tool, if applicable. No additional management support is needed unless otherwise documented below in the visit note. 

## 2014-04-19 NOTE — Progress Notes (Addendum)
Subjective:    Patient ID: Monica Mora, female    DOB: [birth year], 79 y.o.   MRN: [id number]  HPI  Here to f/u rash and pain.  Pain overall improved., now on valtrex x 4 days.  Rash still significant, concerned about how long she is to be contagious. Pt denies chest pain, increased sob or doe, wheezing, orthopnea, PND, increased LE swelling, palpitations, dizziness or syncope.   Pt denies polydipsia, polyuria, Pt denies new neurological symptoms such as new headache, or facial or extremity weakness or numbness Due for prevnar Past Medical History  Diagnosis Date  . Heart murmur   . TB (tuberculosis) 2002    positive  . Abnormal chest x-ray   . Other voice and resonance disorders   . Glaucoma   . Unspecified essential hypertension   . Unspecified hypothyroidism   . Nontoxic multinodular goiter   . Constrictive pericarditis     s/p pericardial window in 2002 in [place]  . Atrial fibrillation   . Hyperlipidemia   . CHF (congestive heart failure)   . H/O: hysterectomy 1992   Past Surgical History  Procedure Laterality Date  . Pericardial window  2002  . Abdominal hysterectomy  1992  . Radioactive iodine for hyperthyroidism/multinodular goiter      s/p    reports that she quit smoking about 35 years ago. She has never used smokeless tobacco. She reports that she does not drink alcohol or use illicit drugs. family history includes Arthritis in her father and mother; Asthma in her brother; Cancer in her sister; Cervical cancer in her sister. No Known Allergies Current Outpatient Prescriptions on File Prior to Visit  Medication Sig Dispense Refill  . ALPHAGAN P 0.1 % SOLN       . b complex vitamins tablet Take 1 tablet by mouth daily.        . Cholecalciferol (VITAMIN D) 1000 UNITS capsule Take 1,000 Units by mouth daily.        . Diclofenac Sodium (PENNSAID) 2 % SOLN Place 2 application onto the skin 2 (two) times daily.  112 g  3  . dorzolamide-timolol (COSOPT) 22.3-6.8  MG/ML ophthalmic solution Place into both eyes as directed.      . fish oil-omega-3 fatty acids 1000 MG capsule Take 1 capsule by mouth daily.       . hydrochlorothiazide (MICROZIDE) 12.5 MG capsule TAKE ONE CAPSULE BY MOUTH EVERY DAY  90 capsule  3  . levothyroxine (SYNTHROID, LEVOTHROID) 75 MCG tablet TAKE 1 TABLET (75 MCG TOTAL) BY MOUTH DAILY.  90 tablet  1  . lisinopril (PRINIVIL,ZESTRIL) 40 MG tablet TAKE 1 TABLET BY MOUTH EVERY DAY  90 tablet  3  . LUMIGAN 0.01 % SOLN       . metoprolol succinate (TOPROL-XL) 50 MG 24 hr tablet Take 1 tablet (50 mg total) by mouth daily. BRAND NAME ONLY  90 tablet  3  . Multiple Vitamins-Minerals (CENTRUM) tablet Take 1 tablet by mouth daily.        . valACYclovir (VALTREX) 1000 MG tablet Take 1 tablet (1,000 mg total) by mouth 3 (three) times daily.  21 tablet  0  . warfarin (COUMADIN) 5 MG tablet Take 1 tablet (5 mg total) by mouth as directed.  90 tablet  1   No current facility-administered medications on file prior to visit.    Review of Systems All otherwise neg per pt     Objective:   Physical Exam BP 106/78  Pulse  69  Temp(Src) 98 F (36.7 C) (Oral)  Wt 216 lb 2 oz (98.034 kg)  SpO2 97% VS noted,  Constitutional: Pt appears well-developed, well-nourished.  HENT: Head: NCAT.  Right Ear: External ear normal.  Left Ear: External ear normal.  Eyes: . Pupils are equal, round, and reactive to light. Conjunctivae and EOM are normal Neck: Normal range of motion. Neck supple.  Cardiovascular: Normal rate and regular rhythm.   Pulmonary/Chest: Effort normal and breath sounds normal.  Abd:  Soft, NT, ND, + BS Neurological: Pt is alert. Not confused , motor grossly intact Skin: Skin is warm. Rash somewhat improved but still several grouped vesicles on erythema base not post right night and lower ant neck Psychiatric: Pt behavior is normal. No agitation.      Assessment & Plan:

## 2014-04-19 NOTE — Patient Instructions (Addendum)
You had the new Prevnar pneumonia shot today  Please continue all other medications as before, including the valtrex  You should be non-infectious after Friday this wk  You can consider the shingles shot at your next visit  Please have the pharmacy call with any other refills you may need.  Please continue your efforts at being more active, low cholesterol diet, and weight control.  Please return in 3 months, or sooner if needed, with Lab testing done 3-5 days before

## 2014-04-25 DIAGNOSIS — B029 Zoster without complications: Secondary | ICD-10-CM | POA: Insufficient documentation

## 2014-04-25 NOTE — Assessment & Plan Note (Signed)
D/w pt natural history, to finiish antibx, tylenol prn, watch for PHN,  to f/u any worsening symptoms or concerns

## 2014-05-06 ENCOUNTER — Ambulatory Visit (INDEPENDENT_AMBULATORY_CARE_PROVIDER_SITE_OTHER): Payer: Medicare Other | Admitting: Pharmacist

## 2014-05-06 ENCOUNTER — Telehealth: Payer: Self-pay | Admitting: *Deleted

## 2014-05-06 DIAGNOSIS — I4891 Unspecified atrial fibrillation: Secondary | ICD-10-CM

## 2014-05-06 DIAGNOSIS — Z7901 Long term (current) use of anticoagulants: Secondary | ICD-10-CM

## 2014-05-06 LAB — POCT INR: INR: 1.6

## 2014-05-06 NOTE — Telephone Encounter (Signed)
Pt called and said she forgot to inform that her Pharmacy had changed suppliers  of her coumadin and she wanted to let us know Informed that she has appt to be rechecked in 2 weeks and that will be good to reckeck at that time that usually does not change any readings and she states understanding

## 2014-05-25 ENCOUNTER — Ambulatory Visit (INDEPENDENT_AMBULATORY_CARE_PROVIDER_SITE_OTHER): Payer: Medicare Other | Admitting: *Deleted

## 2014-05-25 DIAGNOSIS — Z7901 Long term (current) use of anticoagulants: Secondary | ICD-10-CM

## 2014-05-25 DIAGNOSIS — I4891 Unspecified atrial fibrillation: Secondary | ICD-10-CM

## 2014-05-25 LAB — POCT INR: INR: 2.1

## 2014-06-03 NOTE — Telephone Encounter (Signed)
error 

## 2014-06-22 ENCOUNTER — Ambulatory Visit (INDEPENDENT_AMBULATORY_CARE_PROVIDER_SITE_OTHER): Payer: Medicare Other | Admitting: Pharmacist

## 2014-06-22 DIAGNOSIS — Z7901 Long term (current) use of anticoagulants: Secondary | ICD-10-CM

## 2014-06-22 DIAGNOSIS — I4891 Unspecified atrial fibrillation: Secondary | ICD-10-CM

## 2014-06-22 LAB — POCT INR: INR: 2.4

## 2014-07-27 ENCOUNTER — Ambulatory Visit (INDEPENDENT_AMBULATORY_CARE_PROVIDER_SITE_OTHER): Payer: Medicare Other | Admitting: *Deleted

## 2014-07-27 DIAGNOSIS — I4891 Unspecified atrial fibrillation: Secondary | ICD-10-CM

## 2014-07-27 DIAGNOSIS — Z7901 Long term (current) use of anticoagulants: Secondary | ICD-10-CM

## 2014-07-27 LAB — POCT INR: INR: 2.3

## 2014-08-03 ENCOUNTER — Other Ambulatory Visit: Payer: Self-pay | Admitting: Endocrinology

## 2014-09-02 ENCOUNTER — Other Ambulatory Visit: Payer: Self-pay | Admitting: Internal Medicine

## 2014-09-07 ENCOUNTER — Ambulatory Visit (INDEPENDENT_AMBULATORY_CARE_PROVIDER_SITE_OTHER): Payer: Medicare Other | Admitting: Pharmacist

## 2014-09-07 DIAGNOSIS — Z7901 Long term (current) use of anticoagulants: Secondary | ICD-10-CM

## 2014-09-07 DIAGNOSIS — I4891 Unspecified atrial fibrillation: Secondary | ICD-10-CM

## 2014-09-07 LAB — POCT INR: INR: 2.8

## 2014-10-20 ENCOUNTER — Ambulatory Visit (INDEPENDENT_AMBULATORY_CARE_PROVIDER_SITE_OTHER): Payer: Medicare Other | Admitting: Pharmacist

## 2014-10-20 DIAGNOSIS — Z7901 Long term (current) use of anticoagulants: Secondary | ICD-10-CM

## 2014-10-20 DIAGNOSIS — I4891 Unspecified atrial fibrillation: Secondary | ICD-10-CM

## 2014-10-20 LAB — POCT INR: INR: 1.7

## 2014-11-03 NOTE — Progress Notes (Signed)
Marland Kitchen HPI Patient is a 80 year old with a history of PAF and reported constrictive pericarditis though history suggests simple pericarditis with window placement.  No records available.  Echo from 2007 normal.   I saw the patient in clinic 1 year ago   Since seen she denies CP  Breathing is OK  No palpitations.  No dizziness     Current Outpatient Prescriptions  Medication Sig Dispense Refill  . ALPHAGAN P 0.1 % SOLN     . b complex vitamins tablet Take 1 tablet by mouth daily.      . Cholecalciferol (VITAMIN D) 1000 UNITS capsule Take 1,000 Units by mouth daily.      . Diclofenac Sodium (PENNSAID) 2 % SOLN Place 2 application onto the skin 2 (two) times daily. 112 g 3  . dorzolamide-timolol (COSOPT) 22.3-6.8 MG/ML ophthalmic solution Place into both eyes as directed.    . fish oil-omega-3 fatty acids 1000 MG capsule Take 1 capsule by mouth daily.     . hydrochlorothiazide (MICROZIDE) 12.5 MG capsule TAKE ONE CAPSULE BY MOUTH EVERY DAY 90 capsule 3  . levothyroxine (SYNTHROID, LEVOTHROID) 75 MCG tablet TAKE 1 TABLET BY MOUTH ONCE DAILY 90 tablet 1  . lisinopril (PRINIVIL,ZESTRIL) 40 MG tablet TAKE 1 TABLET BY MOUTH EVERY DAY 90 tablet 3  . LUMIGAN 0.01 % SOLN     . metoprolol succinate (TOPROL-XL) 50 MG 24 hr tablet Take 1 tablet (50 mg total) by mouth daily. BRAND NAME ONLY 90 tablet 3  . Multiple Vitamins-Minerals (CENTRUM) tablet Take 1 tablet by mouth daily.      . valACYclovir (VALTREX) 1000 MG tablet Take 1 tablet (1,000 mg total) by mouth 3 (three) times daily. 21 tablet 0  . warfarin (COUMADIN) 5 MG tablet TAKE 1 TABLET (5 MG TOTAL) BY MOUTH AS DIRECTED. 90 tablet 1   No current facility-administered medications for this visit.    Past Medical History  Diagnosis Date  . Heart murmur   . TB (tuberculosis) 2002    positive  . Abnormal chest x-ray   . Other voice and resonance disorders   . Glaucoma   . Unspecified essential hypertension   . Unspecified hypothyroidism   .  Nontoxic multinodular goiter   . Constrictive pericarditis     s/p pericardial window in 2002 in [place]  . Atrial fibrillation   . Hyperlipidemia   . CHF (congestive heart failure)   . H/O: hysterectomy 1992    Past Surgical History  Procedure Laterality Date  . Pericardial window  2002  . Abdominal hysterectomy  1992  . Radioactive iodine for hyperthyroidism/multinodular goiter      s/p    Family History  Problem Relation Age of Onset  . Asthma Brother   . Arthritis Mother   . Arthritis Father   . Cervical cancer Sister   . Cancer Sister     Cervical Cancer    History   Social History  . Marital Status: Single    Spouse Name: N/A    Number of Children: 0  . Years of Education: N/A   Occupational History  . Retired     Marketing executive work  .     Social History Main Topics  . Smoking status: Former Smoker    Quit date: 10/21/1978  . Smokeless tobacco: Never Used  . Alcohol Use: No  . Drug Use: No  . Sexual Activity: Not Currently   Other Topics Concern  . Not on file  Social History Narrative   Daily Caffeine Use: 1 daily    Review of Systems:  All systems reviewed.  They are negative to the above problem except as previously stated.  Vital Signs: BP 120/72 mmHg  Pulse 57  Ht 5\' 7"  (1.702 m)  Wt 218 lb 1.9 oz (98.939 kg)  BMI 34.15 kg/m2  Physical Exam Patient is in NAD. HEENT:  Normocephalic, atraumatic. EOMI, PERRLA.  Neck: JVP is normal. No thyromegaly. No bruits.  Lungs: clear to auscultation. No rales no wheezes.  Heart: Regular rate and rhythm. Normal S1, S2. No S3.   No significant murmurs. PMI not displaced.  Abdomen:  Supple, nontender. Normal bowel sounds. No masses. No hepatomegaly.  Extremities:   Good distal pulses throughout. No lower extremity edema.  Musculoskeletal :moving all extremities.  Neuro:   alert and oriented x3.  CN II-XII grossly intact.  EKG:  Sinus bradycardia.  57 bpm    Nonspecific ST T wave changes.  Assessment  and Plan:  1.  HTN  Adequate control   2.  PAF  Denies palpitations.  Continue anticoagulation discussed Xarelto/Eliquis  Patinet will check on insurance  3.  HLWill check lipids    4.  Hx pericarditis.  S/p window  Patient asymptomatic.

## 2014-11-03 NOTE — Progress Notes (Deleted)
Pt called and said she forgot to inform that her Pharmacy had changed suppliers  of her coumadin and she wanted to let us know Informed that she has appt to be rechecked in 2 weeks and that will be good to reckeck at that time that usually does not change any readings and she states understanding

## 2014-11-03 NOTE — Progress Notes (Deleted)
[employer] [facility] approved toprol XL through 10/20/2014

## 2014-11-04 ENCOUNTER — Ambulatory Visit (INDEPENDENT_AMBULATORY_CARE_PROVIDER_SITE_OTHER): Payer: Medicare Other | Admitting: Internal Medicine

## 2014-11-04 ENCOUNTER — Ambulatory Visit (INDEPENDENT_AMBULATORY_CARE_PROVIDER_SITE_OTHER): Payer: Medicare Other | Admitting: *Deleted

## 2014-11-04 ENCOUNTER — Encounter: Payer: Self-pay | Admitting: Internal Medicine

## 2014-11-04 VITALS — BP 120/72 | HR 57 | Ht 67.0 in | Wt 218.1 lb

## 2014-11-04 DIAGNOSIS — R5383 Other fatigue: Secondary | ICD-10-CM

## 2014-11-04 DIAGNOSIS — I311 Chronic constrictive pericarditis: Secondary | ICD-10-CM

## 2014-11-04 DIAGNOSIS — E785 Hyperlipidemia, unspecified: Secondary | ICD-10-CM | POA: Diagnosis not present

## 2014-11-04 DIAGNOSIS — Z7901 Long term (current) use of anticoagulants: Secondary | ICD-10-CM

## 2014-11-04 DIAGNOSIS — I4891 Unspecified atrial fibrillation: Secondary | ICD-10-CM

## 2014-11-04 LAB — BASIC METABOLIC PANEL
BUN: 8 mg/dL (ref 6–23)
CO2: 28 meq/L (ref 19–32)
CREATININE: 0.74 mg/dL (ref 0.40–1.20)
Calcium: 9.4 mg/dL (ref 8.4–10.5)
Chloride: 99 mEq/L (ref 96–112)
GFR: 97.81 mL/min (ref >60.00)
GLUCOSE: 85 mg/dL (ref 70–99)
Potassium: 3.9 mEq/L (ref 3.5–5.1)
SODIUM: 133 meq/L — AB (ref 135–145)

## 2014-11-04 LAB — POCT INR: INR: 1.6

## 2014-11-04 LAB — LIPID PANEL
CHOL/HDL RATIO: 5
Cholesterol: 181 mg/dL (ref 0–200)
HDL: 35.1 mg/dL — ABNORMAL LOW (ref >39.00)
LDL Cholesterol: 119 mg/dL — ABNORMAL HIGH (ref 0–99)
NonHDL: 145.9
Triglycerides: 137 mg/dL (ref 0.0–149.0)
VLDL: 27.4 mg/dL (ref 0.0–40.0)

## 2014-11-04 LAB — CBC WITH DIFFERENTIAL/PLATELET
BASOS ABS: 0 10*3/uL (ref 0.0–0.1)
BASOS PCT: 0.4 % (ref 0.0–3.0)
EOS PCT: 1.1 % (ref 0.0–5.0)
Eosinophils Absolute: 0.1 10*3/uL (ref 0.0–0.7)
HCT: 40.5 % (ref 36.0–46.0)
Hemoglobin: 13.5 g/dL (ref 12.0–15.0)
LYMPHS ABS: 3.2 10*3/uL (ref 0.7–4.0)
LYMPHS PCT: 35.3 % (ref 12.0–46.0)
MCHC: 33.3 g/dL (ref 30.0–36.0)
MCV: 95.3 fl (ref 78.0–100.0)
MONO ABS: 1.1 10*3/uL — AB (ref 0.1–1.0)
Monocytes Relative: 12.4 % — ABNORMAL HIGH (ref 3.0–12.0)
Neutro Abs: 4.6 10*3/uL (ref 1.4–7.7)
Neutrophils Relative %: 50.8 % (ref 43.0–77.0)
PLATELETS: 226 10*3/uL (ref 150.0–400.0)
RBC: 4.25 Mil/uL (ref 3.87–5.11)
RDW: 14.3 % (ref 11.5–15.5)
WBC: 9 10*3/uL (ref 4.0–10.5)

## 2014-11-04 LAB — TSH: TSH: 0.65 u[IU]/mL (ref 0.35–4.50)

## 2014-11-04 MED ORDER — METOPROLOL SUCCINATE ER 50 MG PO TB24
50.0000 mg | ORAL_TABLET | Freq: Every day | ORAL | Status: DC
Start: 2014-11-04 — End: 2015-09-01

## 2014-11-04 NOTE — Patient Instructions (Signed)
Your physician recommends that you continue on your current medications as directed. Please refer to the Current Medication list given to you today.  Your physician recommends that you return for lab work in: Today  Your physician wants you to follow-up in: 10 months. You will receive a reminder letter in the mail two months in advance. If you don't receive a letter, please call our office to schedule the follow-up appointment.

## 2014-11-17 ENCOUNTER — Ambulatory Visit (INDEPENDENT_AMBULATORY_CARE_PROVIDER_SITE_OTHER): Payer: Medicare Other | Admitting: Pharmacist

## 2014-11-17 DIAGNOSIS — Z7901 Long term (current) use of anticoagulants: Secondary | ICD-10-CM | POA: Diagnosis not present

## 2014-11-17 DIAGNOSIS — I4891 Unspecified atrial fibrillation: Secondary | ICD-10-CM

## 2014-11-17 LAB — POCT INR: INR: 2.1

## 2014-12-06 ENCOUNTER — Ambulatory Visit (INDEPENDENT_AMBULATORY_CARE_PROVIDER_SITE_OTHER): Payer: Medicare Other | Admitting: *Deleted

## 2014-12-06 DIAGNOSIS — Z7901 Long term (current) use of anticoagulants: Secondary | ICD-10-CM | POA: Diagnosis not present

## 2014-12-06 DIAGNOSIS — I4891 Unspecified atrial fibrillation: Secondary | ICD-10-CM

## 2014-12-06 LAB — POCT INR: INR: 1.8

## 2014-12-26 ENCOUNTER — Ambulatory Visit (INDEPENDENT_AMBULATORY_CARE_PROVIDER_SITE_OTHER): Payer: Medicare Other | Admitting: *Deleted

## 2014-12-26 DIAGNOSIS — Z7901 Long term (current) use of anticoagulants: Secondary | ICD-10-CM | POA: Diagnosis not present

## 2014-12-26 DIAGNOSIS — I4891 Unspecified atrial fibrillation: Secondary | ICD-10-CM

## 2014-12-26 LAB — POCT INR: INR: 2

## 2015-01-05 ENCOUNTER — Telehealth: Payer: Self-pay

## 2015-01-05 ENCOUNTER — Other Ambulatory Visit: Payer: Self-pay | Admitting: Internal Medicine

## 2015-01-05 NOTE — Telephone Encounter (Signed)
Pt declines flu vaccine

## 2015-01-10 ENCOUNTER — Other Ambulatory Visit: Payer: Self-pay | Admitting: Internal Medicine

## 2015-01-11 ENCOUNTER — Ambulatory Visit (INDEPENDENT_AMBULATORY_CARE_PROVIDER_SITE_OTHER): Payer: Medicare Other | Admitting: *Deleted

## 2015-01-11 DIAGNOSIS — Z7901 Long term (current) use of anticoagulants: Secondary | ICD-10-CM | POA: Diagnosis not present

## 2015-01-11 DIAGNOSIS — I4891 Unspecified atrial fibrillation: Secondary | ICD-10-CM | POA: Diagnosis not present

## 2015-01-11 LAB — POCT INR: INR: 2.6

## 2015-01-27 ENCOUNTER — Telehealth: Payer: Self-pay | Admitting: Internal Medicine

## 2015-01-27 NOTE — Telephone Encounter (Signed)
Patient informed. 

## 2015-01-27 NOTE — Telephone Encounter (Signed)
Salt water gargle at home is fine  Though she should consider the possibility of allergies and post nasal gtt, and if not taking, could try OTC zyrtec and even nasacort (if no allergies to these)

## 2015-01-27 NOTE — Telephone Encounter (Signed)
Patient states she has a hoarseness.  She is requesting Dr. Jenny Reichmann to call something into [facility] on [address] for her to gargle with.

## 2015-01-30 ENCOUNTER — Encounter: Payer: Self-pay | Admitting: Internal Medicine

## 2015-01-30 ENCOUNTER — Ambulatory Visit (INDEPENDENT_AMBULATORY_CARE_PROVIDER_SITE_OTHER): Payer: Medicare Other | Admitting: Internal Medicine

## 2015-01-30 VITALS — BP 116/70 | HR 67 | Temp 98.2°F | Ht 67.0 in | Wt 215.5 lb

## 2015-01-30 DIAGNOSIS — J209 Acute bronchitis, unspecified: Secondary | ICD-10-CM | POA: Diagnosis not present

## 2015-01-30 MED ORDER — HYDROCODONE-HOMATROPINE 5-1.5 MG/5ML PO SYRP: 5.0000 mL | ORAL_SOLUTION | Freq: Four times a day (QID) | ORAL | Status: DC | PRN

## 2015-01-30 MED ORDER — AMOXICILLIN 500 MG PO CAPS: 500.0000 mg | ORAL_CAPSULE | Freq: Three times a day (TID) | ORAL | Status: DC

## 2015-01-30 NOTE — Progress Notes (Signed)
Pre visit review using our clinic review tool, if applicable. No additional management support is needed unless otherwise documented below in the visit note. 

## 2015-01-30 NOTE — Patient Instructions (Signed)
Plain Mucinex (NOT D) for thick secretions ;force NON dairy fluids .   Nasal cleansing in the shower as discussed with lather of mild shampoo.After 10 seconds wash off lather while  exhaling through nostrils. Make sure that all residual soap is removed to prevent irritation.  Flonase OR Nasacort AQ 1 spray in each nostril twice a day as needed. Use the "crossover" technique into opposite nostril spraying toward opposite ear @ 45 degree angle, not straight up into nostril.  Plain Allegra (NOT D )  160 daily , Loratidine 10 mg , OR Zyrtec 10 mg @ bedtime  as needed for itchy eyes & sneezing.Reflux of gastric acid may be asymptomatic as this may occur mainly during sleep.The triggers for reflux  include stress; the "aspirin family" ; alcohol; peppermint; and caffeine (coffee, tea, cola, and chocolate). The aspirin family would include aspirin and the nonsteroidal agents such as ibuprofen &  Naproxen. Tylenol would not cause reflux. If having symptoms ; food & drink should be avoided for @ least 2 hours before going to bed.   If the cough fails to respond to these medications; consideration should be given to changing  lisinopril to another class of blood pressure medicines as this can cause cough, usually a dry cough.

## 2015-01-30 NOTE — Progress Notes (Signed)
   Subjective:    Patient ID: Monica Mora, female    DOB: [birth year], 80 y.o.   MRN: [id number]  HPI  Her symptoms began 01/26/15 as hoarseness and being chilled. As of 4/8 she developed a cough. The hoarseness has improved somewhat. Cough is productive of clear sputum. The cough is especially problematic after waking. She used Zyrtec over the weekend as well as saline nasal spray with marginal response.  She denies any significant reflux or upper respiratory tract or extrinsic symptoms.   Review of Systems   Frontal headache, facial pain , nasal purulence, dental pain, sore throat , otic pain or otic discharge denied. No fever , chills or sweats.  Extrinsic symptoms of itchy, watery eyes, sneezing, or angioedema are denied. There is no wheezing,dyspnea or  paroxysmal nocturnal dyspnea.     Objective:   Physical Exam  Pertinent or positive findings include : The nares are dry and erythematous.  She has an upper plate and lower partial.  She has fine rales at the bases greater in the right lower lobe than the left. There is no associated increased work of breathing or other abnormal breath sounds  General appearance:Adequately nourished; no acute distress or increased work of breathing is present.   Lymphatic: No  lymphadenopathy about the head, neck, or axilla . Eyes: No conjunctival inflammation or lid edema is present. There is no scleral icterus. Ears:  External ear exam shows no significant lesions or deformities.  Otoscopic examination reveals clear canals, tympanic membranes are intact bilaterally without bulging, retraction, inflammation or discharge. Nose:  External nasal examination shows no deformity or inflammation. No septal dislocation or deviation.No obstruction to airflow.  Oral exam:  lips and gums are healthy appearing.There is no oropharyngeal erythema or exudate . Neck:  No deformities, thyromegaly, masses, or tenderness noted.   Supple with full range of motion  without pain.  Heart:  Normal rate and regular rhythm. S1 and S2 normal without gallop, murmur, click, rub or other extra sounds.  Extremities:  No cyanosis, edema, or clubbing  noted  Skin: Warm & dry w/o tenting  No significant lesions or rash.       Assessment & Plan:  #1 acute bronchitis  #2 history of reflux; this is asymptomatic at this time  3 ACE inhibitor therapy  Plan: See orders. If symptoms persist; chest x-ray and change in antihypertensive therapy would be considered.

## 2015-02-02 ENCOUNTER — Ambulatory Visit (INDEPENDENT_AMBULATORY_CARE_PROVIDER_SITE_OTHER): Payer: Medicare Other

## 2015-02-02 ENCOUNTER — Telehealth: Payer: Self-pay | Admitting: Endocrinology

## 2015-02-02 DIAGNOSIS — I4891 Unspecified atrial fibrillation: Secondary | ICD-10-CM | POA: Diagnosis not present

## 2015-02-02 DIAGNOSIS — Z7901 Long term (current) use of anticoagulants: Secondary | ICD-10-CM | POA: Diagnosis not present

## 2015-02-02 LAB — POCT INR: INR: 1.9

## 2015-02-02 MED ORDER — LEVOTHYROXINE SODIUM 75 MCG PO TABS: 75.0000 ug | ORAL_TABLET | Freq: Every day | ORAL | Status: DC

## 2015-02-02 NOTE — Telephone Encounter (Signed)
Patient need refill of Levothyroxine and need Authorization

## 2015-02-02 NOTE — Telephone Encounter (Signed)
Contacted pt and advised rx has been sent. Pt scheduled for 02/10/2015 at 145 pm.

## 2015-02-10 ENCOUNTER — Encounter: Payer: Self-pay | Admitting: Endocrinology

## 2015-02-10 ENCOUNTER — Ambulatory Visit (INDEPENDENT_AMBULATORY_CARE_PROVIDER_SITE_OTHER): Payer: Medicare Other | Admitting: Endocrinology

## 2015-02-10 VITALS — BP 132/72 | HR 65 | Temp 97.6°F | Ht 67.0 in | Wt 218.0 lb

## 2015-02-10 DIAGNOSIS — E890 Postprocedural hypothyroidism: Secondary | ICD-10-CM

## 2015-02-10 NOTE — Progress Notes (Signed)
Subjective:    Patient ID: Monica Mora, female    DOB: 1935-07-03, 80 y.o.   MRN: [id number]  HPI Pt had i-131 rx for hyperthyroidism, due to a goiter with just one small nodule, in 2008. She has been on synthroid since soon thereafter.  She has been on the same dosage (75 mcg/day) for several years.  She sees Dr Harrington Challenger for AF.  pt states she feels well in general.  Last [place] in 2014 was unchanged.  pt states she feels well in general.   Pt states slight ticking sensation in the throat, but no assoc cough Past Medical History  Diagnosis Date  . Heart murmur   . TB (tuberculosis) 2002    positive  . Abnormal chest x-ray   . Other voice and resonance disorders   . Glaucoma   . Unspecified essential hypertension   . Unspecified hypothyroidism   . Nontoxic multinodular goiter   . Constrictive pericarditis     s/p pericardial window in 2002 in [place]  . Atrial fibrillation   . Hyperlipidemia   . CHF (congestive heart failure)   . H/O: hysterectomy 1992    Past Surgical History  Procedure Laterality Date  . Pericardial window  2002  . Abdominal hysterectomy  1992  . Radioactive iodine for hyperthyroidism/multinodular goiter      s/p    History   Social History  . Marital Status: Single    Spouse Name: N/A  . Number of Children: 0  . Years of Education: N/A   Occupational History  . Retired     Marketing executive work  .     Social History Main Topics  . Smoking status: Former Smoker    Quit date: 10/21/1978  . Smokeless tobacco: Never Used  . Alcohol Use: No  . Drug Use: No  . Sexual Activity: Not Currently   Other Topics Concern  . Not on file   Social History Narrative   Daily Caffeine Use: 1 daily    Current Outpatient Prescriptions on File Prior to Visit  Medication Sig Dispense Refill  . ALPHAGAN P 0.1 % SOLN     . b complex vitamins tablet Take 1 tablet by mouth daily.      . Cholecalciferol (VITAMIN D) 1000 UNITS capsule Take 1,000 Units by mouth daily.       . dorzolamide-timolol (COSOPT) 22.3-6.8 MG/ML ophthalmic solution Place into both eyes as directed.    . fish oil-omega-3 fatty acids 1000 MG capsule Take 1 capsule by mouth daily.     . hydrochlorothiazide (MICROZIDE) 12.5 MG capsule TAKE ONE CAPSULE BY MOUTH EVERY DAY 90 capsule 1  . levothyroxine (SYNTHROID, LEVOTHROID) 75 MCG tablet Take 1 tablet (75 mcg total) by mouth daily. 30 tablet 0  . lisinopril (PRINIVIL,ZESTRIL) 40 MG tablet TAKE 1 TABLET BY MOUTH EVERY DAY 90 tablet 1  . LUMIGAN 0.01 % SOLN     . metoprolol succinate (TOPROL-XL) 50 MG 24 hr tablet Take 1 tablet (50 mg total) by mouth daily. BRAND NAME ONLY 90 tablet 3  . Multiple Vitamins-Minerals (CENTRUM) tablet Take 1 tablet by mouth daily.      Marland Kitchen warfarin (COUMADIN) 5 MG tablet TAKE 1 TABLET (5 MG TOTAL) BY MOUTH AS DIRECTED. 90 tablet 1  . amoxicillin (AMOXIL) 500 MG capsule Take 1 capsule (500 mg total) by mouth 3 (three) times daily. (Patient not taking: Reported on 02/10/2015) 21 capsule 0  . HYDROcodone-homatropine (HYDROMET) 5-1.5 MG/5ML syrup Take 5 mLs  by mouth every 6 (six) hours as needed for cough. (Patient not taking: Reported on 02/10/2015) 120 mL 0   No current facility-administered medications on file prior to visit.    No Known Allergies  Family History  Problem Relation Age of Onset  . Asthma Brother   . Arthritis Mother   . Arthritis Father   . Cervical cancer Sister   . Cancer Sister     Cervical Cancer    BP 132/72 mmHg  Pulse 65  Temp(Src) 97.6 F (36.4 C) (Oral)  Ht '5\' 7"'$  (1.702 m)  Wt 218 lb (98.884 kg)  BMI 34.14 kg/m2  SpO2 96%  Review of Systems Denies weight change and neck pain    Objective:   Physical Exam VITAL SIGNS:  See vs page GENERAL: no distress Neck: thyroid is slightly and diffusely enlarged.  No palpable nodule.  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 Cough, new, prob due to zestril  Patient is advised the following: Patient Instructions  Please continue the same levothyroxine. Please return in 1 year. Please make an appointment for your annual physical with Dr Jenny Reichmann. You require less than a full daily amount of levothyroxine.  This means that you need for the medication will go up or down over the next few years, but will probably stay the same.  Please see Dr Jenny Reichmann if the sensation in your throat persists, as it could be due to the lisinopril

## 2015-02-10 NOTE — Patient Instructions (Addendum)
Please continue the same levothyroxine. Please return in 1 year. Please make an appointment for your annual physical with Dr Jenny Reichmann. You require less than a full daily amount of levothyroxine.  This means that you need for the medication will go up or down over the next few years, but will probably stay the same.  Please see Dr Jenny Reichmann if the sensation in your throat persists, as it could be due to the lisinopril

## 2015-02-22 ENCOUNTER — Ambulatory Visit (INDEPENDENT_AMBULATORY_CARE_PROVIDER_SITE_OTHER): Payer: Medicare Other | Admitting: *Deleted

## 2015-02-22 ENCOUNTER — Encounter: Payer: Medicare Other | Admitting: Internal Medicine

## 2015-02-22 DIAGNOSIS — Z7901 Long term (current) use of anticoagulants: Secondary | ICD-10-CM | POA: Diagnosis not present

## 2015-02-22 DIAGNOSIS — I4891 Unspecified atrial fibrillation: Secondary | ICD-10-CM

## 2015-02-22 LAB — POCT INR: INR: 1.8

## 2015-03-01 ENCOUNTER — Ambulatory Visit (INDEPENDENT_AMBULATORY_CARE_PROVIDER_SITE_OTHER): Payer: Medicare Other | Admitting: Internal Medicine

## 2015-03-01 ENCOUNTER — Encounter: Payer: Self-pay | Admitting: Internal Medicine

## 2015-03-01 VITALS — BP 122/72 | HR 62 | Temp 98.3°F | Resp 18 | Ht 67.0 in | Wt 216.1 lb

## 2015-03-01 DIAGNOSIS — Z0000 Encounter for general adult medical examination without abnormal findings: Secondary | ICD-10-CM | POA: Diagnosis not present

## 2015-03-01 NOTE — Progress Notes (Signed)
Pre visit review using our clinic review tool, if applicable. No additional management support is needed unless otherwise documented below in the visit note. 

## 2015-03-01 NOTE — Progress Notes (Signed)
Subjective:    Patient ID: Monica Mora, female    DOB: 01-19-1935, 80 y.o.   MRN: [id number]  HPI  Here for wellness and f/u;  Overall doing ok;  Pt denies Chest pain, worsening SOB, DOE, wheezing, orthopnea, PND, worsening LE edema, palpitations, dizziness or syncope.  Pt denies neurological change such as new headache, facial or extremity weakness.  Pt denies polydipsia, polyuria, or low sugar symptoms. Pt states overall good compliance with treatment and medications, good tolerability, and has been trying to follow appropriate diet.  Pt denies worsening depressive symptoms, suicidal ideation or panic. No fever, night sweats, wt loss, loss of appetite, or other constitutional symptoms.  Pt states good ability with ADL's, has low fall risk, home safety reviewed and adequate, no other significant changes in hearing or vision, and only occasionally active with exercise.  Had recent bronchitis now resolved Past Medical History  Diagnosis Date  . Heart murmur   . TB (tuberculosis) 2002    positive  . Abnormal chest x-ray   . Other voice and resonance disorders   . Glaucoma   . Unspecified essential hypertension   . Unspecified hypothyroidism   . Nontoxic multinodular goiter   . Constrictive pericarditis     s/p pericardial window in 2002 in [place]  . Atrial fibrillation   . Hyperlipidemia   . CHF (congestive heart failure)   . H/O: hysterectomy 1992   Past Surgical History  Procedure Laterality Date  . Pericardial window  2002  . Abdominal hysterectomy  1992  . Radioactive iodine for hyperthyroidism/multinodular goiter      s/p    reports that she quit smoking about 36 years ago. She has never used smokeless tobacco. She reports that she does not drink alcohol or use illicit drugs. family history includes Arthritis in her father and mother; Asthma in her brother; Cancer in her sister; Cervical cancer in her sister. No Known Allergies Current Outpatient Prescriptions on File  Prior to Visit  Medication Sig Dispense Refill  . ALPHAGAN P 0.1 % SOLN     . b complex vitamins tablet Take 1 tablet by mouth daily.      . Cholecalciferol (VITAMIN D) 1000 UNITS capsule Take 1,000 Units by mouth daily.      . dorzolamide-timolol (COSOPT) 22.3-6.8 MG/ML ophthalmic solution Place into both eyes as directed.    . fish oil-omega-3 fatty acids 1000 MG capsule Take 1 capsule by mouth daily.     . hydrochlorothiazide (MICROZIDE) 12.5 MG capsule TAKE ONE CAPSULE BY MOUTH EVERY DAY 90 capsule 1  . levothyroxine (SYNTHROID, LEVOTHROID) 75 MCG tablet Take 1 tablet (75 mcg total) by mouth daily. 30 tablet 0  . lisinopril (PRINIVIL,ZESTRIL) 40 MG tablet TAKE 1 TABLET BY MOUTH EVERY DAY 90 tablet 1  . LUMIGAN 0.01 % SOLN     . metoprolol succinate (TOPROL-XL) 50 MG 24 hr tablet Take 1 tablet (50 mg total) by mouth daily. BRAND NAME ONLY 90 tablet 3  . Multiple Vitamins-Minerals (CENTRUM) tablet Take 1 tablet by mouth daily.      Marland Kitchen warfarin (COUMADIN) 5 MG tablet TAKE 1 TABLET (5 MG TOTAL) BY MOUTH AS DIRECTED. 90 tablet 1   No current facility-administered medications on file prior to visit.   Review of Systems Constitutional: Negative for increased diaphoresis, other activity, appetite or siginficant weight change other than noted HENT: Negative for worsening hearing loss, ear pain, facial swelling, mouth sores and neck stiffness.   Eyes: Negative for  other worsening pain, redness or visual disturbance.  Respiratory: Negative for shortness of breath and wheezing  Cardiovascular: Negative for chest pain and palpitations.  Gastrointestinal: Negative for diarrhea, blood in stool, abdominal distention or other pain Genitourinary: Negative for hematuria, flank pain or change in urine volume.  Musculoskeletal: Negative for myalgias or other joint complaints.  Skin: Negative for color change and wound or drainage.  Neurological: Negative for syncope and numbness. other than  noted Hematological: Negative for adenopathy. or other swelling Psychiatric/Behavioral: Negative for hallucinations, SI, self-injury, decreased concentration or other worsening agitation.      Objective:   Physical Exam BP 122/72 mmHg  Pulse 62  Temp(Src) 98.3 F (36.8 C) (Oral)  Resp 18  Ht '5\' 7"'$  (1.702 m)  Wt 216 lb 1.9 oz (98.031 kg)  BMI 33.84 kg/m2  SpO2 96% VS noted,  Constitutional: Pt is oriented to person, place, and time. Appears well-developed and well-nourished, in no significant distress Head: Normocephalic and atraumatic.  Right Ear: External ear normal.  Left Ear: External ear normal.  Nose: Nose normal.  Mouth/Throat: Oropharynx is clear and moist.  Eyes: Conjunctivae and EOM are normal. Pupils are equal, round, and reactive to light.  Neck: Normal range of motion. Neck supple. No JVD present. No tracheal deviation present or significant neck LA or mass Cardiovascular: Normal rate, regular rhythm, normal heart sounds and intact distal pulses.   Pulmonary/Chest: Effort normal and breath sounds without rales or wheezing  Abdominal: Soft. Bowel sounds are normal. NT. No HSM  Musculoskeletal: Normal range of motion. Exhibits no edema.  Lymphadenopathy:  Has no cervical adenopathy.  Neurological: Pt is alert and oriented to person, place, and time. Pt has normal reflexes. No cranial nerve deficit. Motor grossly intact Skin: Skin is warm and dry. No rash noted.  Psychiatric:  Has normal mood and affect. Behavior is normal.     Assessment & Plan:

## 2015-03-01 NOTE — Assessment & Plan Note (Signed)

## 2015-03-01 NOTE — Patient Instructions (Signed)
Please continue all other medications as before, and refills have been done if requested.  Please have the pharmacy call with any other refills you may need.  Please continue your efforts at being more active, low cholesterol diet, and weight control.  You are otherwise up to date with prevention measures today.  Please keep your appointments with your specialists as you may have planned  Please return in 1 year for your yearly visit, or sooner if needed, with Lab testing done 3-5 days before  

## 2015-03-07 ENCOUNTER — Encounter: Payer: Medicare Other | Admitting: Internal Medicine

## 2015-03-07 ENCOUNTER — Telehealth: Payer: Self-pay | Admitting: Internal Medicine

## 2015-03-07 NOTE — Telephone Encounter (Signed)
Pt would like to know if there is a specific type of stacking to buy for varicose vans. Pt states  Dr. Harrington Challenger had suggested for her to buy about 2 years ago, and she did not buy. Pt was made aware that if she can go to a medial supply they would suggest the size she would need. Pt states she know a supply store that she can get them. She will go today.

## 2015-03-07 NOTE — Telephone Encounter (Signed)
New problem   Pt want to know if there is a certain type support stocking she need to purchase. Please call pt.

## 2015-03-07 NOTE — Telephone Encounter (Signed)
Left pt a message to call back. 

## 2015-03-08 ENCOUNTER — Ambulatory Visit (INDEPENDENT_AMBULATORY_CARE_PROVIDER_SITE_OTHER): Payer: Medicare Other | Admitting: *Deleted

## 2015-03-08 DIAGNOSIS — I4891 Unspecified atrial fibrillation: Secondary | ICD-10-CM

## 2015-03-08 DIAGNOSIS — Z7901 Long term (current) use of anticoagulants: Secondary | ICD-10-CM | POA: Diagnosis not present

## 2015-03-08 LAB — POCT INR: INR: 4.3

## 2015-03-09 ENCOUNTER — Other Ambulatory Visit: Payer: Self-pay | Admitting: Endocrinology

## 2015-03-23 ENCOUNTER — Ambulatory Visit (INDEPENDENT_AMBULATORY_CARE_PROVIDER_SITE_OTHER): Payer: Medicare Other | Admitting: *Deleted

## 2015-03-23 DIAGNOSIS — Z7901 Long term (current) use of anticoagulants: Secondary | ICD-10-CM

## 2015-03-23 DIAGNOSIS — I4891 Unspecified atrial fibrillation: Secondary | ICD-10-CM | POA: Diagnosis not present

## 2015-03-23 LAB — POCT INR: INR: 4

## 2015-04-01 ENCOUNTER — Other Ambulatory Visit: Payer: Self-pay | Admitting: Internal Medicine

## 2015-04-05 ENCOUNTER — Ambulatory Visit (INDEPENDENT_AMBULATORY_CARE_PROVIDER_SITE_OTHER): Payer: Medicare Other | Admitting: *Deleted

## 2015-04-05 DIAGNOSIS — I4891 Unspecified atrial fibrillation: Secondary | ICD-10-CM | POA: Diagnosis not present

## 2015-04-05 DIAGNOSIS — Z7901 Long term (current) use of anticoagulants: Secondary | ICD-10-CM | POA: Diagnosis not present

## 2015-04-05 LAB — POCT INR: INR: 3.4

## 2015-04-08 ENCOUNTER — Other Ambulatory Visit: Payer: Self-pay | Admitting: Endocrinology

## 2015-04-11 ENCOUNTER — Encounter: Payer: Medicare Other | Admitting: Internal Medicine

## 2015-04-19 ENCOUNTER — Ambulatory Visit (INDEPENDENT_AMBULATORY_CARE_PROVIDER_SITE_OTHER): Payer: Medicare Other | Admitting: *Deleted

## 2015-04-19 DIAGNOSIS — Z7901 Long term (current) use of anticoagulants: Secondary | ICD-10-CM | POA: Diagnosis not present

## 2015-04-19 DIAGNOSIS — I4891 Unspecified atrial fibrillation: Secondary | ICD-10-CM

## 2015-04-19 LAB — POCT INR: INR: 3.1

## 2015-05-03 ENCOUNTER — Ambulatory Visit (INDEPENDENT_AMBULATORY_CARE_PROVIDER_SITE_OTHER): Payer: Medicare Other | Admitting: Pharmacist

## 2015-05-03 DIAGNOSIS — I4891 Unspecified atrial fibrillation: Secondary | ICD-10-CM

## 2015-05-03 DIAGNOSIS — Z7901 Long term (current) use of anticoagulants: Secondary | ICD-10-CM | POA: Diagnosis not present

## 2015-05-03 DIAGNOSIS — H4011X3 Primary open-angle glaucoma, severe stage: Secondary | ICD-10-CM | POA: Diagnosis not present

## 2015-05-03 DIAGNOSIS — H524 Presbyopia: Secondary | ICD-10-CM | POA: Diagnosis not present

## 2015-05-03 DIAGNOSIS — H4011X2 Primary open-angle glaucoma, moderate stage: Secondary | ICD-10-CM | POA: Diagnosis not present

## 2015-05-03 LAB — POCT INR: INR: 2.6

## 2015-05-05 ENCOUNTER — Other Ambulatory Visit: Payer: Self-pay | Admitting: Endocrinology

## 2015-05-08 ENCOUNTER — Other Ambulatory Visit: Payer: Self-pay | Admitting: Endocrinology

## 2015-05-25 ENCOUNTER — Ambulatory Visit (INDEPENDENT_AMBULATORY_CARE_PROVIDER_SITE_OTHER): Payer: Medicare Other | Admitting: *Deleted

## 2015-05-25 DIAGNOSIS — Z7901 Long term (current) use of anticoagulants: Secondary | ICD-10-CM | POA: Diagnosis not present

## 2015-05-25 DIAGNOSIS — I4891 Unspecified atrial fibrillation: Secondary | ICD-10-CM

## 2015-05-25 LAB — POCT INR: INR: 1.9

## 2015-06-02 ENCOUNTER — Other Ambulatory Visit: Payer: Self-pay | Admitting: Endocrinology

## 2015-06-10 ENCOUNTER — Other Ambulatory Visit: Payer: Self-pay | Admitting: Endocrinology

## 2015-06-14 ENCOUNTER — Ambulatory Visit (INDEPENDENT_AMBULATORY_CARE_PROVIDER_SITE_OTHER): Payer: Medicare Other | Admitting: Pharmacist

## 2015-06-14 DIAGNOSIS — Z7901 Long term (current) use of anticoagulants: Secondary | ICD-10-CM | POA: Diagnosis not present

## 2015-06-14 DIAGNOSIS — I4891 Unspecified atrial fibrillation: Secondary | ICD-10-CM

## 2015-06-14 LAB — POCT INR: INR: 2.2

## 2015-07-07 ENCOUNTER — Telehealth: Payer: Self-pay | Admitting: Endocrinology

## 2015-07-07 MED ORDER — LEVOTHYROXINE SODIUM 75 MCG PO TABS: ORAL_TABLET | ORAL | Status: DC

## 2015-07-07 NOTE — Telephone Encounter (Signed)
Rx sent to pharmacy per pt's request.  

## 2015-07-07 NOTE — Telephone Encounter (Signed)
Needs levothyroxine refill 90 day supply called into [facility] on [address]

## 2015-07-12 ENCOUNTER — Ambulatory Visit (INDEPENDENT_AMBULATORY_CARE_PROVIDER_SITE_OTHER): Payer: Medicare Other | Admitting: *Deleted

## 2015-07-12 DIAGNOSIS — Z7901 Long term (current) use of anticoagulants: Secondary | ICD-10-CM | POA: Diagnosis not present

## 2015-07-12 DIAGNOSIS — I4891 Unspecified atrial fibrillation: Secondary | ICD-10-CM | POA: Diagnosis not present

## 2015-07-12 LAB — POCT INR: INR: 1.9

## 2015-07-13 ENCOUNTER — Other Ambulatory Visit: Payer: Self-pay | Admitting: Internal Medicine

## 2015-08-09 ENCOUNTER — Ambulatory Visit (INDEPENDENT_AMBULATORY_CARE_PROVIDER_SITE_OTHER): Payer: Medicare Other | Admitting: *Deleted

## 2015-08-09 DIAGNOSIS — I4891 Unspecified atrial fibrillation: Secondary | ICD-10-CM

## 2015-08-09 DIAGNOSIS — Z7901 Long term (current) use of anticoagulants: Secondary | ICD-10-CM | POA: Diagnosis not present

## 2015-08-09 LAB — POCT INR: INR: 2.1

## 2015-08-11 ENCOUNTER — Ambulatory Visit: Payer: Medicare Other

## 2015-08-30 ENCOUNTER — Other Ambulatory Visit (INDEPENDENT_AMBULATORY_CARE_PROVIDER_SITE_OTHER): Payer: Medicare Other | Admitting: *Deleted

## 2015-08-30 DIAGNOSIS — E890 Postprocedural hypothyroidism: Secondary | ICD-10-CM | POA: Diagnosis not present

## 2015-08-30 DIAGNOSIS — I311 Chronic constrictive pericarditis: Secondary | ICD-10-CM | POA: Diagnosis not present

## 2015-08-30 DIAGNOSIS — I482 Chronic atrial fibrillation, unspecified: Secondary | ICD-10-CM

## 2015-08-30 DIAGNOSIS — E042 Nontoxic multinodular goiter: Secondary | ICD-10-CM | POA: Diagnosis not present

## 2015-08-30 LAB — CBC WITH DIFFERENTIAL/PLATELET
Basophils Absolute: 0 10*3/uL (ref 0.0–0.1)
Basophils Relative: 0 % (ref 0–1)
Eosinophils Absolute: 0.2 10*3/uL (ref 0.0–0.7)
Eosinophils Relative: 2 % (ref 0–5)
HCT: 40.1 % (ref 36.0–46.0)
HEMOGLOBIN: 13.5 g/dL (ref 12.0–15.0)
LYMPHS ABS: 3.3 10*3/uL (ref 0.7–4.0)
Lymphocytes Relative: 38 % (ref 12–46)
MCH: 31.7 pg (ref 26.0–34.0)
MCHC: 33.7 g/dL (ref 30.0–36.0)
MCV: 94.1 fL (ref 78.0–100.0)
MONO ABS: 0.6 10*3/uL (ref 0.1–1.0)
MONOS PCT: 7 % (ref 3–12)
MPV: 9.7 fL (ref 8.6–12.4)
NEUTROS ABS: 4.6 10*3/uL (ref 1.7–7.7)
NEUTROS PCT: 53 % (ref 43–77)
Platelets: 253 10*3/uL (ref 150–400)
RBC: 4.26 MIL/uL (ref 3.87–5.11)
RDW: 13.8 % (ref 11.5–15.5)
WBC: 8.6 10*3/uL (ref 4.0–10.5)

## 2015-08-30 LAB — TSH: TSH: 0.713 u[IU]/mL (ref 0.350–4.500)

## 2015-08-30 NOTE — Addendum Note (Signed)
Addended by: Eulis Foster on: 08/30/2015 02:23 PM   Modules accepted: Orders

## 2015-08-31 LAB — LIPID PANEL
Cholesterol: 197 mg/dL (ref 125–200)
HDL: 37 mg/dL — ABNORMAL LOW (ref >=46)
LDL CALC: 140 mg/dL — AB (ref <130)
TRIGLYCERIDES: 99 mg/dL (ref <150)
Total CHOL/HDL Ratio: 5.3 Ratio — ABNORMAL HIGH (ref <=5.0)
VLDL: 20 mg/dL (ref <30)

## 2015-08-31 LAB — BASIC METABOLIC PANEL
BUN: 10 mg/dL (ref 7–25)
CHLORIDE: 98 mmol/L (ref 98–110)
CO2: 25 mmol/L (ref 20–31)
CREATININE: 0.73 mg/dL (ref 0.60–0.93)
Calcium: 9 mg/dL (ref 8.6–10.4)
Glucose, Bld: 75 mg/dL (ref 65–99)
Potassium: 4 mmol/L (ref 3.5–5.3)
Sodium: 133 mmol/L — ABNORMAL LOW (ref 135–146)

## 2015-08-31 LAB — HEPATIC FUNCTION PANEL
ALK PHOS: 37 U/L (ref 33–130)
ALT: 19 U/L (ref 6–29)
AST: 22 U/L (ref 10–35)
Albumin: 3.6 g/dL (ref 3.6–5.1)
BILIRUBIN INDIRECT: 0.5 mg/dL (ref 0.2–1.2)
BILIRUBIN TOTAL: 0.6 mg/dL (ref 0.2–1.2)
Bilirubin, Direct: 0.1 mg/dL (ref <=0.2)
TOTAL PROTEIN: 7.3 g/dL (ref 6.1–8.1)

## 2015-09-01 ENCOUNTER — Ambulatory Visit (INDEPENDENT_AMBULATORY_CARE_PROVIDER_SITE_OTHER): Payer: Medicare Other | Admitting: *Deleted

## 2015-09-01 ENCOUNTER — Encounter: Payer: Self-pay | Admitting: Internal Medicine

## 2015-09-01 ENCOUNTER — Ambulatory Visit (INDEPENDENT_AMBULATORY_CARE_PROVIDER_SITE_OTHER): Payer: Medicare Other | Admitting: Internal Medicine

## 2015-09-01 VITALS — BP 112/72 | HR 60 | Ht 67.5 in | Wt 213.4 lb

## 2015-09-01 DIAGNOSIS — I4891 Unspecified atrial fibrillation: Secondary | ICD-10-CM | POA: Diagnosis not present

## 2015-09-01 DIAGNOSIS — Z7901 Long term (current) use of anticoagulants: Secondary | ICD-10-CM | POA: Diagnosis not present

## 2015-09-01 DIAGNOSIS — I10 Essential (primary) hypertension: Secondary | ICD-10-CM

## 2015-09-01 DIAGNOSIS — I480 Paroxysmal atrial fibrillation: Secondary | ICD-10-CM | POA: Diagnosis not present

## 2015-09-01 DIAGNOSIS — I482 Chronic atrial fibrillation, unspecified: Secondary | ICD-10-CM

## 2015-09-01 LAB — POCT INR: INR: 2.5

## 2015-09-01 MED ORDER — TOPROL XL 50 MG PO TB24: 50.0000 mg | ORAL_TABLET | Freq: Every day | ORAL | Status: DC

## 2015-09-01 NOTE — Progress Notes (Signed)
Cardiology Office Note   Date:  09/01/2015   ID:  Monica Mora, DOB 03-08-1935, MRN [id number]  PCP:  Cathlean Cower, MD  Cardiologist:   Dorris Carnes, MD   Chief Complaint  Patient presents with  . pericarditis  . Atrial Fibrillation      History of Present Illness: Monica Mora is a 80 y.o. female with a history of PAF, Pericarditis (s/p window).  I saw her in clinic Jan 2016   Denies palpitations  Breating is OK  No CP       Current Outpatient Prescriptions  Medication Sig Dispense Refill  . ALPHAGAN P 0.1 % SOLN     . b complex vitamins tablet Take 1 tablet by mouth daily.      . Cholecalciferol (VITAMIN D) 1000 UNITS capsule Take 1,000 Units by mouth daily.      . dorzolamide-timolol (COSOPT) 22.3-6.8 MG/ML ophthalmic solution Place into both eyes as directed.    . fish oil-omega-3 fatty acids 1000 MG capsule Take 1 capsule by mouth daily.     . hydrochlorothiazide (MICROZIDE) 12.5 MG capsule TAKE ONE CAPSULE BY MOUTH EVERY DAY 90 capsule 1  . levothyroxine (SYNTHROID, LEVOTHROID) 75 MCG tablet TAKE 1 TABLET (75 MCG TOTAL) BY MOUTH DAILY. 30 tablet 0  . lisinopril (PRINIVIL,ZESTRIL) 40 MG tablet TAKE 1 TABLET BY MOUTH EVERY DAY 90 tablet 1  . LUMIGAN 0.01 % SOLN     . metoprolol succinate (TOPROL-XL) 50 MG 24 hr tablet Take 1 tablet (50 mg total) by mouth daily. BRAND NAME ONLY 90 tablet 3  . Multiple Vitamins-Minerals (CENTRUM) tablet Take 1 tablet by mouth daily.      Marland Kitchen warfarin (COUMADIN) 5 MG tablet TAKE 1 TABLET (5 MG TOTAL) BY MOUTH AS DIRECTED. 90 tablet 1   No current facility-administered medications for this visit.    Allergies:   Review of patient's allergies indicates no known allergies.   Past Medical History  Diagnosis Date  . Heart murmur   . TB (tuberculosis) 2002    positive  . Abnormal chest x-ray   . Other voice and resonance disorders   . Glaucoma   . Unspecified essential hypertension   . Unspecified hypothyroidism   . Nontoxic  multinodular goiter   . Constrictive pericarditis     s/p pericardial window in 2002 in [place]  . Atrial fibrillation ([place])   . Hyperlipidemia   . CHF (congestive heart failure) ([place])   . H/O: hysterectomy 1992    Past Surgical History  Procedure Laterality Date  . Pericardial window  2002  . Abdominal hysterectomy  1992  . Radioactive iodine for hyperthyroidism/multinodular goiter      s/p     Social History:  The patient  reports that she quit smoking about 36 years ago. She has never used smokeless tobacco. She reports that she does not drink alcohol or use illicit drugs.   Family History:  The patient's family history includes Arthritis in her father and mother; Asthma in her brother; Cancer in her sister; Cervical cancer in her sister.    ROS:  Please see the history of present illness. All other systems are reviewed and  Negative to the above problem except as noted.    PHYSICAL EXAM: VS:  BP 112/72 mmHg  Pulse 60  Ht 5' 7.5" (1.715 m)  Wt 96.798 kg (213 lb 6.4 oz)  BMI 32.91 kg/m2  GEN: Obese 80 yo in no acute distress HEENT: normal Neck: no  JVD, carotid bruits, or masses Cardiac: RRR; no murmurs, rubs, or gallops,no edema  Respiratory:  clear to auscultation bilaterally, normal work of breathing GI: soft, nontender, nondistended, + BS  No hepatomegaly  MS: no deformity Moving all extremities   Skin: warm and dry, no rash Neuro:  Strength and sensation are intact Psych: euthymic mood, full affect   EKG:  EKG is not ordered today.   Lipid Panel    Component Value Date/Time   CHOL 197 08/30/2015 1423   TRIG 99 08/30/2015 1423   HDL 37* 08/30/2015 1423   CHOLHDL 5.3* 08/30/2015 1423   VLDL 20 08/30/2015 1423   LDLCALC 140* 08/30/2015 1423   LDLDIRECT 149.7 07/17/2012 0849      Wt Readings from Last 3 Encounters:  09/01/15 96.798 kg (213 lb 6.4 oz)  03/01/15 98.031 kg (216 lb 1.9 oz)  02/10/15 98.884 kg (218 lb)      ASSESSMENT AND PLAN:  1  PAF  Keep on current regimen  2.  HTN  BP is well controlled  Encouraged her to increase activity, lose wt  WIll f/u in 9 months.   \   Signed, Dorris Carnes, MD  09/01/2015 3:29 PM    [address], [place], [place]  [address] Phone: [phone]; Fax: [phone]

## 2015-09-15 ENCOUNTER — Encounter: Payer: Self-pay | Admitting: Physician Assistant

## 2015-09-15 ENCOUNTER — Ambulatory Visit (INDEPENDENT_AMBULATORY_CARE_PROVIDER_SITE_OTHER): Payer: Medicare Other | Admitting: Physician Assistant

## 2015-09-15 VITALS — BP 160/70 | HR 60 | Temp 97.9°F | Wt 214.0 lb

## 2015-09-15 DIAGNOSIS — J208 Acute bronchitis due to other specified organisms: Secondary | ICD-10-CM | POA: Diagnosis not present

## 2015-09-15 MED ORDER — BENZONATATE 200 MG PO CAPS: 200.0000 mg | ORAL_CAPSULE | Freq: Three times a day (TID) | ORAL | Status: DC | PRN

## 2015-09-15 NOTE — Progress Notes (Signed)
Pre visit review using our clinic review tool, if applicable. No additional management support is needed unless otherwise documented below in the visit note. 

## 2015-09-15 NOTE — Assessment & Plan Note (Signed)
Exam unremarkable. Viral bronchitis suspected. Supportive measures reviewed. Rx Tessalon. Follow-up if symptoms are not resolving over the next 3-4 days.

## 2015-09-15 NOTE — Patient Instructions (Signed)
Please stay hydrated and rest.  Get some Plain Mucinex tablets to take as directed.  Place a humidifier in your bedroom. Use the Tessalon pills as directed for cough.  Upper Respiratory Infection, Adult Most upper respiratory infections (URIs) are caused by a virus. A URI affects the nose, throat, and upper air passages. The most common type of URI is often called "the common cold." HOME CARE   Take medicines only as told by your doctor.  Gargle warm saltwater or take cough drops to comfort your throat as told by your doctor.  Use a warm mist humidifier or inhale steam from a shower to increase air moisture. This may make it easier to breathe.  Drink enough fluid to keep your pee (urine) clear or pale yellow.  Eat soups and other clear broths.  Have a healthy diet.  Rest as needed.  Go back to work when your fever is gone or your doctor says it is okay.  You may need to stay home longer to avoid giving your URI to others.  You can also wear a face mask and wash your hands often to prevent spread of the virus.  Use your inhaler more if you have asthma.  Do not use any tobacco products, including cigarettes, chewing tobacco, or electronic cigarettes. If you need help quitting, ask your doctor. GET HELP IF:  You are getting worse, not better.  Your symptoms are not helped by medicine.  You have chills.  You are getting more short of breath.  You have brown or red mucus.  You have yellow or brown discharge from your nose.  You have pain in your face, especially when you bend forward.  You have a fever.  You have puffy (swollen) neck glands.  You have pain while swallowing.  You have white areas in the back of your throat. GET HELP RIGHT AWAY IF:   You have very bad or constant:  Headache.  Ear pain.  Pain in your forehead, behind your eyes, and over your cheekbones (sinus pain).  Chest pain.  You have long-lasting (chronic) lung disease and any of the  following:  Wheezing.  Long-lasting cough.  Coughing up blood.  A change in your usual mucus.  You have a stiff neck.  You have changes in your:  Vision.  Hearing.  Thinking.  Mood. MAKE SURE YOU:   Understand these instructions.  Will watch your condition.  Will get help right away if you are not doing well or get worse.   This information is not intended to replace advice given to you by your health care provider. Make sure you discuss any questions you have with your health care provider.   Document Released: 03/25/2008 Document Revised: 02/21/2015 Document Reviewed: 01/12/2014 Elsevier Interactive Patient Education [employer].

## 2015-09-15 NOTE — Progress Notes (Signed)
Patient presents to clinic today c/o 5 days of chest congestion with cough. Cough is mainly dry but sometimes productive of clear sputum. Denies fever, chills, sinus pain or pressure. Denies recent travel. Endorses sick contact.  Past Medical History  Diagnosis Date  . Heart murmur   . TB (tuberculosis) 2002    positive  . Abnormal chest x-ray   . Other voice and resonance disorders   . Glaucoma   . Unspecified essential hypertension   . Unspecified hypothyroidism   . Nontoxic multinodular goiter   . Constrictive pericarditis     s/p pericardial window in 2002 in [place]  . Atrial fibrillation ([place])   . Hyperlipidemia   . CHF (congestive heart failure) ([place])   . H/O: hysterectomy 1992    Current Outpatient Prescriptions on File Prior to Visit  Medication Sig Dispense Refill  . ALPHAGAN P 0.1 % SOLN     . b complex vitamins tablet Take 1 tablet by mouth daily.      . Cholecalciferol (VITAMIN D) 1000 UNITS capsule Take 1,000 Units by mouth daily.      . dorzolamide-timolol (COSOPT) 22.3-6.8 MG/ML ophthalmic solution Place into both eyes as directed.    . fish oil-omega-3 fatty acids 1000 MG capsule Take 1 capsule by mouth daily.     . hydrochlorothiazide (MICROZIDE) 12.5 MG capsule TAKE ONE CAPSULE BY MOUTH EVERY DAY 90 capsule 1  . levothyroxine (SYNTHROID, LEVOTHROID) 75 MCG tablet TAKE 1 TABLET (75 MCG TOTAL) BY MOUTH DAILY. 30 tablet 0  . lisinopril (PRINIVIL,ZESTRIL) 40 MG tablet TAKE 1 TABLET BY MOUTH EVERY DAY 90 tablet 1  . LUMIGAN 0.01 % SOLN     . Multiple Vitamins-Minerals (CENTRUM) tablet Take 1 tablet by mouth daily.      . TOPROL XL 50 MG 24 hr tablet Take 1 tablet (50 mg total) by mouth daily. Take with or immediately following a meal. 90 tablet 3  . warfarin (COUMADIN) 5 MG tablet TAKE 1 TABLET (5 MG TOTAL) BY MOUTH AS DIRECTED. 90 tablet 1   No current facility-administered medications on file prior to visit.    No Known Allergies  Family History    Problem Relation Age of Onset  . Asthma Brother   . Arthritis Mother   . Arthritis Father   . Cervical cancer Sister   . Cancer Sister     Cervical Cancer    Social History   Social History  . Marital Status: Single    Spouse Name: N/A  . Number of Children: 0  . Years of Education: N/A   Occupational History  . Retired     Marketing executive work  .     Social History Main Topics  . Smoking status: Former Smoker    Quit date: 10/21/1978  . Smokeless tobacco: Never Used  . Alcohol Use: No  . Drug Use: No  . Sexual Activity: Not Currently   Other Topics Concern  . None   Social History Narrative   Daily Caffeine Use: 1 daily    Review of Systems - See HPI.  All other ROS are negative.  BP 160/70 mmHg  Pulse 60  Temp(Src) 97.9 F (36.6 C) (Oral)  Wt 214 lb (97.07 kg)  SpO2 93%  Physical Exam  Constitutional: She is oriented to person, place, and time and well-developed, well-nourished, and in no distress.  HENT:  Head: Normocephalic and atraumatic.  Right Ear: External ear normal.  Left Ear: External ear normal.  Nose: Nose normal.  Mouth/Throat: Oropharynx is clear and moist. No oropharyngeal exudate.  TM within normal limits bilaterally.  Cardiovascular: Normal rate, regular rhythm, normal heart sounds and intact distal pulses.   Pulmonary/Chest: Effort normal and breath sounds normal. No respiratory distress. She has no wheezes. She has no rales. She exhibits no tenderness.  Neurological: She is alert and oriented to person, place, and time.  Skin: Skin is warm and dry. No rash noted.  Psychiatric: Affect normal.  Vitals reviewed.   Recent Results (from the past 2160 hour(s))  POCT INR     Status: None   Collection Time: 07/12/15  2:18 PM  Result Value Ref Range   INR 1.9   POCT INR     Status: None   Collection Time: 08/09/15  1:54 PM  Result Value Ref Range   INR 2.1   Basic metabolic panel     Status: Abnormal   Collection Time: 08/30/15  2:23 PM   Result Value Ref Range   Sodium 133 (L) 135 - 146 mmol/L   Potassium 4.0 3.5 - 5.3 mmol/L   Chloride 98 98 - 110 mmol/L   CO2 25 20 - 31 mmol/L   Glucose, Bld 75 65 - 99 mg/dL   BUN 10 7 - 25 mg/dL   Creat 0.73 0.60 - 0.93 mg/dL   Calcium 9.0 8.6 - 10.4 mg/dL  Lipid panel     Status: Abnormal   Collection Time: 08/30/15  2:23 PM  Result Value Ref Range   Cholesterol 197 125 - 200 mg/dL   Triglycerides 99 <150 mg/dL   HDL 37 (L) >=46 mg/dL   Total CHOL/HDL Ratio 5.3 (H) <=5.0 Ratio   VLDL 20 <30 mg/dL   LDL Cholesterol 140 (H) <130 mg/dL    Comment:   Total Cholesterol/HDL Ratio:CHD Risk                        Coronary Heart Disease Risk Table                                        Men       Women          1/2 Average Risk              3.4        3.3              Average Risk              5.0        4.4           2X Average Risk              9.6        7.1           3X Average Risk             23.4       11.0 Use the calculated Patient Ratio above and the CHD Risk table  to determine the patient's CHD Risk.   Hepatic function panel     Status: None   Collection Time: 08/30/15  2:23 PM  Result Value Ref Range   Total Bilirubin 0.6 0.2 - 1.2 mg/dL   Bilirubin, Direct 0.1 <=0.2 mg/dL   Indirect Bilirubin 0.5 0.2 - 1.2 mg/dL   Alkaline Phosphatase 37  33 - 130 U/L   AST 22 10 - 35 U/L   ALT 19 6 - 29 U/L   Total Protein 7.3 6.1 - 8.1 g/dL   Albumin 3.6 3.6 - 5.1 g/dL  CBC with Differential/Platelet     Status: None   Collection Time: 08/30/15  2:23 PM  Result Value Ref Range   WBC 8.6 4.0 - 10.5 K/uL   RBC 4.26 3.87 - 5.11 MIL/uL   Hemoglobin 13.5 12.0 - 15.0 g/dL   HCT 40.1 36.0 - 46.0 %   MCV 94.1 78.0 - 100.0 fL   MCH 31.7 26.0 - 34.0 pg   MCHC 33.7 30.0 - 36.0 g/dL   RDW 13.8 11.5 - 15.5 %   Platelets 253 150 - 400 K/uL   MPV 9.7 8.6 - 12.4 fL   Neutrophils Relative % 53 43 - 77 %   Neutro Abs 4.6 1.7 - 7.7 K/uL   Lymphocytes Relative 38 12 - 46 %   Lymphs Abs  3.3 0.7 - 4.0 K/uL   Monocytes Relative 7 3 - 12 %   Monocytes Absolute 0.6 0.1 - 1.0 K/uL   Eosinophils Relative 2 0 - 5 %   Eosinophils Absolute 0.2 0.0 - 0.7 K/uL   Basophils Relative 0 0 - 1 %   Basophils Absolute 0.0 0.0 - 0.1 K/uL   Smear Review Criteria for review not met   TSH     Status: None   Collection Time: 08/30/15  2:23 PM  Result Value Ref Range   TSH 0.713 0.350 - 4.500 uIU/mL  POCT INR     Status: None   Collection Time: 09/01/15  2:43 PM  Result Value Ref Range   INR 2.5     Assessment/Plan: Viral bronchitis Exam unremarkable. Viral bronchitis suspected. Supportive measures reviewed. Rx Tessalon. Follow-up if symptoms are not resolving over the next 3-4 days.

## 2015-09-27 ENCOUNTER — Ambulatory Visit (INDEPENDENT_AMBULATORY_CARE_PROVIDER_SITE_OTHER): Payer: Medicare Other | Admitting: *Deleted

## 2015-09-27 DIAGNOSIS — I482 Chronic atrial fibrillation, unspecified: Secondary | ICD-10-CM

## 2015-09-27 DIAGNOSIS — Z7901 Long term (current) use of anticoagulants: Secondary | ICD-10-CM | POA: Diagnosis not present

## 2015-09-27 DIAGNOSIS — I4891 Unspecified atrial fibrillation: Secondary | ICD-10-CM

## 2015-09-27 LAB — POCT INR: INR: 1.6

## 2015-10-07 ENCOUNTER — Other Ambulatory Visit: Payer: Self-pay | Admitting: Endocrinology

## 2015-10-11 ENCOUNTER — Ambulatory Visit (INDEPENDENT_AMBULATORY_CARE_PROVIDER_SITE_OTHER): Payer: Medicare Other | Admitting: *Deleted

## 2015-10-11 DIAGNOSIS — I482 Chronic atrial fibrillation, unspecified: Secondary | ICD-10-CM

## 2015-10-11 DIAGNOSIS — I4891 Unspecified atrial fibrillation: Secondary | ICD-10-CM | POA: Diagnosis not present

## 2015-10-11 DIAGNOSIS — Z7901 Long term (current) use of anticoagulants: Secondary | ICD-10-CM | POA: Diagnosis not present

## 2015-10-11 LAB — POCT INR: INR: 1.8

## 2015-10-25 ENCOUNTER — Ambulatory Visit (INDEPENDENT_AMBULATORY_CARE_PROVIDER_SITE_OTHER): Payer: Medicare Other | Admitting: Pharmacist

## 2015-10-25 DIAGNOSIS — I4891 Unspecified atrial fibrillation: Secondary | ICD-10-CM | POA: Diagnosis not present

## 2015-10-25 DIAGNOSIS — Z7901 Long term (current) use of anticoagulants: Secondary | ICD-10-CM

## 2015-10-25 DIAGNOSIS — I482 Chronic atrial fibrillation, unspecified: Secondary | ICD-10-CM

## 2015-10-25 LAB — POCT INR: INR: 2.3

## 2015-11-08 ENCOUNTER — Ambulatory Visit (INDEPENDENT_AMBULATORY_CARE_PROVIDER_SITE_OTHER): Payer: Medicare Other | Admitting: *Deleted

## 2015-11-08 DIAGNOSIS — I482 Chronic atrial fibrillation, unspecified: Secondary | ICD-10-CM

## 2015-11-08 DIAGNOSIS — I4891 Unspecified atrial fibrillation: Secondary | ICD-10-CM | POA: Diagnosis not present

## 2015-11-08 DIAGNOSIS — Z7901 Long term (current) use of anticoagulants: Secondary | ICD-10-CM | POA: Diagnosis not present

## 2015-11-08 LAB — POCT INR: INR: 2.5

## 2015-11-13 ENCOUNTER — Other Ambulatory Visit: Payer: Self-pay | Admitting: Internal Medicine

## 2015-11-20 DIAGNOSIS — H401112 Primary open-angle glaucoma, right eye, moderate stage: Secondary | ICD-10-CM | POA: Diagnosis not present

## 2015-11-20 DIAGNOSIS — H401123 Primary open-angle glaucoma, left eye, severe stage: Secondary | ICD-10-CM | POA: Diagnosis not present

## 2015-11-29 ENCOUNTER — Ambulatory Visit (INDEPENDENT_AMBULATORY_CARE_PROVIDER_SITE_OTHER): Payer: Medicare Other | Admitting: *Deleted

## 2015-11-29 DIAGNOSIS — I482 Chronic atrial fibrillation, unspecified: Secondary | ICD-10-CM

## 2015-11-29 DIAGNOSIS — I4891 Unspecified atrial fibrillation: Secondary | ICD-10-CM

## 2015-11-29 DIAGNOSIS — Z7901 Long term (current) use of anticoagulants: Secondary | ICD-10-CM | POA: Diagnosis not present

## 2015-11-29 LAB — POCT INR: INR: 2.6

## 2015-12-27 ENCOUNTER — Ambulatory Visit (INDEPENDENT_AMBULATORY_CARE_PROVIDER_SITE_OTHER): Payer: Medicare Other | Admitting: *Deleted

## 2015-12-27 DIAGNOSIS — I482 Chronic atrial fibrillation, unspecified: Secondary | ICD-10-CM

## 2015-12-27 DIAGNOSIS — Z7901 Long term (current) use of anticoagulants: Secondary | ICD-10-CM

## 2015-12-27 DIAGNOSIS — I4891 Unspecified atrial fibrillation: Secondary | ICD-10-CM | POA: Diagnosis not present

## 2015-12-27 LAB — POCT INR: INR: 2.9

## 2016-01-03 ENCOUNTER — Other Ambulatory Visit: Payer: Self-pay | Admitting: Endocrinology

## 2016-01-03 ENCOUNTER — Other Ambulatory Visit: Payer: Self-pay | Admitting: Internal Medicine

## 2016-01-18 ENCOUNTER — Other Ambulatory Visit: Payer: Self-pay | Admitting: Internal Medicine

## 2016-02-01 ENCOUNTER — Telehealth: Payer: Self-pay | Admitting: Internal Medicine

## 2016-02-01 NOTE — Telephone Encounter (Signed)
Patient has purchased compression stockings, wears them about every other day. Advised is OK to wear on flight and that she should get up and move around every so often if she can while flying. She voiced understanding.

## 2016-02-01 NOTE — Telephone Encounter (Signed)
Pt will be going to [place] and will be on an 8 hour flight-she wears elastic stockings and needs to know if she should wear them during the whole flight? pls call [phone] or [phone]

## 2016-02-07 ENCOUNTER — Ambulatory Visit (INDEPENDENT_AMBULATORY_CARE_PROVIDER_SITE_OTHER): Payer: Medicare Other | Admitting: *Deleted

## 2016-02-07 DIAGNOSIS — I4891 Unspecified atrial fibrillation: Secondary | ICD-10-CM

## 2016-02-07 DIAGNOSIS — Z7901 Long term (current) use of anticoagulants: Secondary | ICD-10-CM | POA: Diagnosis not present

## 2016-02-07 DIAGNOSIS — I482 Chronic atrial fibrillation, unspecified: Secondary | ICD-10-CM

## 2016-02-07 LAB — POCT INR: INR: 3.7

## 2016-02-23 ENCOUNTER — Ambulatory Visit (INDEPENDENT_AMBULATORY_CARE_PROVIDER_SITE_OTHER): Payer: Medicare Other

## 2016-02-23 DIAGNOSIS — Z7901 Long term (current) use of anticoagulants: Secondary | ICD-10-CM | POA: Diagnosis not present

## 2016-02-23 DIAGNOSIS — I482 Chronic atrial fibrillation, unspecified: Secondary | ICD-10-CM

## 2016-02-23 DIAGNOSIS — I4891 Unspecified atrial fibrillation: Secondary | ICD-10-CM

## 2016-02-23 LAB — POCT INR: INR: 2.6

## 2016-02-28 DIAGNOSIS — H2513 Age-related nuclear cataract, bilateral: Secondary | ICD-10-CM | POA: Diagnosis not present

## 2016-02-28 DIAGNOSIS — H401112 Primary open-angle glaucoma, right eye, moderate stage: Secondary | ICD-10-CM | POA: Diagnosis not present

## 2016-02-28 DIAGNOSIS — H401123 Primary open-angle glaucoma, left eye, severe stage: Secondary | ICD-10-CM | POA: Diagnosis not present

## 2016-03-20 ENCOUNTER — Ambulatory Visit (INDEPENDENT_AMBULATORY_CARE_PROVIDER_SITE_OTHER): Payer: Medicare Other | Admitting: Pharmacist

## 2016-03-20 DIAGNOSIS — I4891 Unspecified atrial fibrillation: Secondary | ICD-10-CM

## 2016-03-20 DIAGNOSIS — I482 Chronic atrial fibrillation, unspecified: Secondary | ICD-10-CM

## 2016-03-20 DIAGNOSIS — Z7901 Long term (current) use of anticoagulants: Secondary | ICD-10-CM | POA: Diagnosis not present

## 2016-03-20 LAB — POCT INR: INR: 2.4

## 2016-04-03 DIAGNOSIS — H401112 Primary open-angle glaucoma, right eye, moderate stage: Secondary | ICD-10-CM | POA: Diagnosis not present

## 2016-04-03 DIAGNOSIS — H401123 Primary open-angle glaucoma, left eye, severe stage: Secondary | ICD-10-CM | POA: Diagnosis not present

## 2016-04-09 ENCOUNTER — Other Ambulatory Visit: Payer: Self-pay | Admitting: Endocrinology

## 2016-04-12 ENCOUNTER — Telehealth: Payer: Self-pay | Admitting: Internal Medicine

## 2016-04-12 DIAGNOSIS — I480 Paroxysmal atrial fibrillation: Secondary | ICD-10-CM

## 2016-04-12 DIAGNOSIS — E785 Hyperlipidemia, unspecified: Secondary | ICD-10-CM

## 2016-04-12 DIAGNOSIS — I10 Essential (primary) hypertension: Secondary | ICD-10-CM

## 2016-04-12 NOTE — Telephone Encounter (Signed)
Pt calling regarding wanting Cholesterol checked prior to [place] visit-can she get order?

## 2016-04-12 NOTE — Telephone Encounter (Signed)
Pt scheduled to see Dr. Harrington Challenger on 7/27. Pt wanting lipids checked prior to appt. Will forward to Dr. Harrington Challenger to see if ok to order lipids and see if there are any other labs she may want drawn.

## 2016-04-13 NOTE — Telephone Encounter (Signed)
Yes, she should have CBC, BMET and lipid panel

## 2016-04-17 ENCOUNTER — Ambulatory Visit (INDEPENDENT_AMBULATORY_CARE_PROVIDER_SITE_OTHER): Payer: Medicare Other

## 2016-04-17 DIAGNOSIS — I482 Chronic atrial fibrillation, unspecified: Secondary | ICD-10-CM

## 2016-04-17 DIAGNOSIS — I4891 Unspecified atrial fibrillation: Secondary | ICD-10-CM | POA: Diagnosis not present

## 2016-04-17 DIAGNOSIS — Z7901 Long term (current) use of anticoagulants: Secondary | ICD-10-CM | POA: Diagnosis not present

## 2016-04-17 LAB — POCT INR: INR: 3

## 2016-04-17 NOTE — Telephone Encounter (Signed)
Left detailed message on patient's self-identified voice mail that Dr. Harrington Challenger wants her to have lab work done prior to her appointment on 05/16/16.  Advised to call the office to schedule the day she will be able to come in. Orders placed.

## 2016-04-30 ENCOUNTER — Telehealth: Payer: Self-pay | Admitting: Internal Medicine

## 2016-04-30 NOTE — Telephone Encounter (Signed)
Pt called request blood work order in the system before she see Dr. Harrington Challenger on 05/16/16. Please advise, last ov with John was 02/2015.

## 2016-05-16 ENCOUNTER — Ambulatory Visit (INDEPENDENT_AMBULATORY_CARE_PROVIDER_SITE_OTHER): Payer: Medicare Other | Admitting: Internal Medicine

## 2016-05-16 ENCOUNTER — Encounter: Payer: Self-pay | Admitting: Internal Medicine

## 2016-05-16 ENCOUNTER — Other Ambulatory Visit: Payer: Medicare Other | Admitting: *Deleted

## 2016-05-16 ENCOUNTER — Ambulatory Visit (INDEPENDENT_AMBULATORY_CARE_PROVIDER_SITE_OTHER): Payer: Medicare Other | Admitting: *Deleted

## 2016-05-16 VITALS — BP 140/74 | HR 58 | Ht 67.5 in | Wt 211.4 lb

## 2016-05-16 DIAGNOSIS — I4891 Unspecified atrial fibrillation: Secondary | ICD-10-CM

## 2016-05-16 DIAGNOSIS — I480 Paroxysmal atrial fibrillation: Secondary | ICD-10-CM

## 2016-05-16 DIAGNOSIS — E785 Hyperlipidemia, unspecified: Secondary | ICD-10-CM | POA: Diagnosis not present

## 2016-05-16 DIAGNOSIS — I10 Essential (primary) hypertension: Secondary | ICD-10-CM

## 2016-05-16 DIAGNOSIS — Z7901 Long term (current) use of anticoagulants: Secondary | ICD-10-CM | POA: Diagnosis not present

## 2016-05-16 LAB — CBC
HEMATOCRIT: 40.1 % (ref 35.0–45.0)
HEMOGLOBIN: 13.3 g/dL (ref 11.7–15.5)
MCH: 31.3 pg (ref 27.0–33.0)
MCHC: 33.2 g/dL (ref 32.0–36.0)
MCV: 94.4 fL (ref 80.0–100.0)
MPV: 9.3 fL (ref 7.5–12.5)
PLATELETS: 231 10*3/uL (ref 140–400)
RBC: 4.25 MIL/uL (ref 3.80–5.10)
RDW: 13.8 % (ref 11.0–15.0)
WBC: 8.9 10*3/uL (ref 3.8–10.8)

## 2016-05-16 LAB — BASIC METABOLIC PANEL
BUN: 8 mg/dL (ref 7–25)
CHLORIDE: 102 mmol/L (ref 98–110)
CO2: 26 mmol/L (ref 20–31)
Calcium: 9 mg/dL (ref 8.6–10.4)
Creat: 0.71 mg/dL (ref 0.60–0.93)
Glucose, Bld: 81 mg/dL (ref 65–99)
POTASSIUM: 3.7 mmol/L (ref 3.5–5.3)
SODIUM: 136 mmol/L (ref 135–146)

## 2016-05-16 LAB — LIPID PANEL
CHOL/HDL RATIO: 5 ratio (ref <=5.0)
Cholesterol: 196 mg/dL (ref 125–200)
HDL: 39 mg/dL — AB (ref >=46)
LDL Cholesterol: 134 mg/dL — ABNORMAL HIGH (ref <130)
Triglycerides: 117 mg/dL (ref <150)
VLDL: 23 mg/dL (ref <30)

## 2016-05-16 LAB — POCT INR: INR: 3.2

## 2016-05-16 NOTE — Patient Instructions (Signed)
Your physician recommends that you continue on your current medications as directed. Please refer to the Current Medication list given to you today.  Your physician wants you to follow-up in: 8 months with Dr. Ross.  You will receive a reminder letter in the mail two months in advance. If you don't receive a letter, please call our office to schedule the follow-up appointment.  

## 2016-05-16 NOTE — Progress Notes (Signed)
Cardiology Office Note   Date:  05/16/2016   ID:  Monica Mora, DOB [birth year], MRN [id number]  PCP:  Cathlean Cower, MD  Cardiologist:   Dorris Carnes, MD   Pt presents for reeval of PAF     History of Present Illness: Monica Mora is a 81 y.o. female with a history of PAF, pericarditis  I saw him in November 2016 Since seen she denies palpitaitons  Breathinging is OK        Outpatient Medications Prior to Visit  Medication Sig Dispense Refill  . ALPHAGAN P 0.1 % SOLN     . b complex vitamins tablet Take 1 tablet by mouth daily.      . Cholecalciferol (VITAMIN D) 1000 UNITS capsule Take 1,000 Units by mouth daily.      . dorzolamide-timolol (COSOPT) 22.3-6.8 MG/ML ophthalmic solution Place into both eyes as directed.    . fish oil-omega-3 fatty acids 1000 MG capsule Take 1 capsule by mouth daily.     . hydrochlorothiazide (MICROZIDE) 12.5 MG capsule TAKE ONE CAPSULE BY MOUTH EVERY DAY 90 capsule 1  . levothyroxine (SYNTHROID, LEVOTHROID) 75 MCG tablet TAKE 1 TABLET BY MOUTH EVERY DAY 90 tablet 2  . lisinopril (PRINIVIL,ZESTRIL) 40 MG tablet TAKE 1 TABLET BY MOUTH EVERY DAY 90 tablet 1  . LUMIGAN 0.01 % SOLN     . Multiple Vitamins-Minerals (CENTRUM) tablet Take 1 tablet by mouth daily.      . TOPROL XL 50 MG 24 hr tablet Take 1 tablet (50 mg total) by mouth daily. Take with or immediately following a meal. 90 tablet 3  . warfarin (COUMADIN) 5 MG tablet TAKE 1 TABLET BY MOUTH AS DIRECTED 90 tablet 1  . benzonatate (TESSALON) 200 MG capsule Take 1 capsule (200 mg total) by mouth 3 (three) times daily as needed for cough. (Patient not taking: Reported on 05/16/2016) 30 capsule 0   No facility-administered medications prior to visit.      Allergies:   Review of patient's allergies indicates no known allergies.   Past Medical History:  Diagnosis Date  . Abnormal chest x-ray   . Atrial fibrillation ([place])   . CHF (congestive heart failure) ([place])   . Constrictive  pericarditis    s/p pericardial window in 2002 in [place]  . Glaucoma   . H/O: hysterectomy 1992  . Heart murmur   . Hyperlipidemia   . Nontoxic multinodular goiter   . Other voice and resonance disorders   . TB (tuberculosis) 2002   positive  . Unspecified essential hypertension   . Unspecified hypothyroidism     Past Surgical History:  Procedure Laterality Date  . ABDOMINAL HYSTERECTOMY  1992  . PERICARDIAL WINDOW  2002  . Radioactive Iodine for hyperthyroidism/Multinodular goiter     s/p     Social History:  The patient  reports that she quit smoking about 37 years ago. She has never used smokeless tobacco. She reports that she does not drink alcohol or use drugs.   Family History:  The patient's family history includes Arthritis in her father and mother; Asthma in her brother; Cancer in her sister; Cervical cancer in her sister.    ROS:  Please see the history of present illness. All other systems are reviewed and  Negative to the above problem except as noted.    PHYSICAL EXAM: VS:  BP 140/74   Pulse (!) 58   Ht 5' 7.5" (1.715 m)   Wt 211 lb 6.4  oz (95.9 kg)   BMI 32.62 kg/m   GEN: Well nourished, well developed, in no acute distress  HEENT: normal  Neck: no JVD, carotid bruits, or masses Cardiac: RRR; no murmurs, rubs, or gallops,no edema  Respiratory:  clear to auscultation bilaterally, normal work of breathing GI: soft, nontender, nondistended, + BS  No hepatomegaly  MS: no deformity Moving all extremities   Skin: warm and dry, no rash Neuro:  Strength and sensation are intact Psych: euthymic mood, full affect   EKG:  EKG is ordered today.  SB 58     Lipid Panel    Component Value Date/Time   CHOL 197 08/30/2015 1423   TRIG 99 08/30/2015 1423   HDL 37 (L) 08/30/2015 1423   CHOLHDL 5.3 (H) 08/30/2015 1423   VLDL 20 08/30/2015 1423   LDLCALC 140 (H) 08/30/2015 1423   LDLDIRECT 149.7 07/17/2012 0849      Wt Readings from Last 3 Encounters:    05/16/16 211 lb 6.4 oz (95.9 kg)  09/15/15 214 lb (97.1 kg)  09/01/15 213 lb 6.4 oz (96.8 kg)      ASSESSMENT AND PLAN:  1  PAF  Keep on curr meds incluidng coumadin  2.  HTN  Good control    Keep on current regimen  F/U in 8 months      Signed, Dorris Carnes, MD  05/16/2016 4:31 PM    [facility] [place], [place],   [address] Phone: [phone]; Fax: [phone]

## 2016-05-22 DIAGNOSIS — H524 Presbyopia: Secondary | ICD-10-CM | POA: Diagnosis not present

## 2016-05-22 DIAGNOSIS — H401112 Primary open-angle glaucoma, right eye, moderate stage: Secondary | ICD-10-CM | POA: Diagnosis not present

## 2016-05-22 DIAGNOSIS — H401123 Primary open-angle glaucoma, left eye, severe stage: Secondary | ICD-10-CM | POA: Diagnosis not present

## 2016-05-29 ENCOUNTER — Ambulatory Visit (INDEPENDENT_AMBULATORY_CARE_PROVIDER_SITE_OTHER): Payer: Medicare Other | Admitting: *Deleted

## 2016-05-29 DIAGNOSIS — I4891 Unspecified atrial fibrillation: Secondary | ICD-10-CM | POA: Diagnosis not present

## 2016-05-29 DIAGNOSIS — Z7901 Long term (current) use of anticoagulants: Secondary | ICD-10-CM | POA: Diagnosis not present

## 2016-05-29 LAB — POCT INR: INR: 2.5

## 2016-06-26 ENCOUNTER — Ambulatory Visit (INDEPENDENT_AMBULATORY_CARE_PROVIDER_SITE_OTHER): Payer: Medicare Other | Admitting: *Deleted

## 2016-06-26 DIAGNOSIS — Z7901 Long term (current) use of anticoagulants: Secondary | ICD-10-CM | POA: Diagnosis not present

## 2016-06-26 DIAGNOSIS — I4891 Unspecified atrial fibrillation: Secondary | ICD-10-CM | POA: Diagnosis not present

## 2016-06-26 LAB — POCT INR: INR: 3.8

## 2016-07-08 DIAGNOSIS — H401112 Primary open-angle glaucoma, right eye, moderate stage: Secondary | ICD-10-CM | POA: Diagnosis not present

## 2016-07-08 DIAGNOSIS — H401123 Primary open-angle glaucoma, left eye, severe stage: Secondary | ICD-10-CM | POA: Diagnosis not present

## 2016-07-10 ENCOUNTER — Ambulatory Visit (INDEPENDENT_AMBULATORY_CARE_PROVIDER_SITE_OTHER): Payer: Medicare Other | Admitting: *Deleted

## 2016-07-10 DIAGNOSIS — Z7901 Long term (current) use of anticoagulants: Secondary | ICD-10-CM

## 2016-07-10 DIAGNOSIS — I4891 Unspecified atrial fibrillation: Secondary | ICD-10-CM

## 2016-07-10 LAB — POCT INR: INR: 3

## 2016-07-12 ENCOUNTER — Encounter: Payer: Self-pay | Admitting: Internal Medicine

## 2016-07-18 ENCOUNTER — Ambulatory Visit (INDEPENDENT_AMBULATORY_CARE_PROVIDER_SITE_OTHER): Payer: Medicare Other

## 2016-07-18 DIAGNOSIS — Z23 Encounter for immunization: Secondary | ICD-10-CM

## 2016-07-19 ENCOUNTER — Ambulatory Visit (INDEPENDENT_AMBULATORY_CARE_PROVIDER_SITE_OTHER): Payer: Medicare Other | Admitting: Endocrinology

## 2016-07-19 ENCOUNTER — Other Ambulatory Visit: Payer: Self-pay | Admitting: Internal Medicine

## 2016-07-19 ENCOUNTER — Encounter: Payer: Self-pay | Admitting: Endocrinology

## 2016-07-19 VITALS — BP 132/84 | HR 56 | Wt 211.0 lb

## 2016-07-19 DIAGNOSIS — E890 Postprocedural hypothyroidism: Secondary | ICD-10-CM

## 2016-07-19 LAB — TSH: TSH: 0.69 u[IU]/mL (ref 0.35–4.50)

## 2016-07-19 NOTE — Progress Notes (Signed)
Subjective:    Patient ID: Monica Mora, female    DOB: [birth year], 81 y.o.   MRN: [id number]  HPI Pt returns for f/u of post-RAI hypothyroidism (she had i-131 rx for hyperthyroidism, due to a goiter with just one small nodule, in 2008; she has been on synthroid since soon thereafter; she has been on the same dosage (75 mcg/day) for several years; she sees Dr Harrington Challenger for AF; last [place] in 2014 was unchanged).  pt states she feels well in general.  pt states she feels well in general.   Past Medical History:  Diagnosis Date  . Abnormal chest x-ray   . Atrial fibrillation ([place])   . CHF (congestive heart failure) ([place])   . Constrictive pericarditis    s/p pericardial window in 2002 in [place]  . Glaucoma   . H/O: hysterectomy 1992  . Heart murmur   . Hyperlipidemia   . Nontoxic multinodular goiter   . Other voice and resonance disorders   . TB (tuberculosis) 2002   positive  . Unspecified essential hypertension   . Unspecified hypothyroidism     Past Surgical History:  Procedure Laterality Date  . ABDOMINAL HYSTERECTOMY  1992  . PERICARDIAL WINDOW  2002  . Radioactive Iodine for hyperthyroidism/Multinodular goiter     s/p    Social History   Social History  . Marital status: Single    Spouse name: N/A  . Number of children: 0  . Years of education: N/A   Occupational History  . Retired     Marketing executive work  .  Retired   Social History Main Topics  . Smoking status: Former Smoker    Quit date: 10/21/1978  . Smokeless tobacco: Never Used  . Alcohol use No  . Drug use: No  . Sexual activity: Not Currently   Other Topics Concern  . Not on file   Social History Narrative   Daily Caffeine Use: 1 daily    Current Outpatient Prescriptions on File Prior to Visit  Medication Sig Dispense Refill  . ALPHAGAN P 0.1 % SOLN     . b complex vitamins tablet Take 1 tablet by mouth daily.      . Cholecalciferol (VITAMIN D) 1000 UNITS capsule Take 1,000 Units by mouth daily.       . dorzolamide-timolol (COSOPT) 22.3-6.8 MG/ML ophthalmic solution Place into both eyes as directed.    . fish oil-omega-3 fatty acids 1000 MG capsule Take 1 capsule by mouth daily.     Marland Kitchen levothyroxine (SYNTHROID, LEVOTHROID) 75 MCG tablet TAKE 1 TABLET BY MOUTH EVERY DAY 90 tablet 2  . LUMIGAN 0.01 % SOLN     . TOPROL XL 50 MG 24 hr tablet Take 1 tablet (50 mg total) by mouth daily. Take with or immediately following a meal. 90 tablet 3  . warfarin (COUMADIN) 5 MG tablet TAKE 1 TABLET BY MOUTH AS DIRECTED 90 tablet 1  . Multiple Vitamins-Minerals (CENTRUM) tablet Take 1 tablet by mouth daily.       No current facility-administered medications on file prior to visit.     No Known Allergies  Family History  Problem Relation Age of Onset  . Asthma Brother   . Arthritis Mother   . Arthritis Father   . Cervical cancer Sister   . Cancer Sister     Cervical Cancer    BP 132/84   Pulse (!) 56   Wt 211 lb (95.7 kg)   SpO2 96%   BMI  32.56 kg/m    Review of Systems No weight change    Objective:   Physical Exam VITAL SIGNS:  See vs page GENERAL: no distress Neck: thyroid is slightly and diffusely enlarged.  No palpable nodule.  Lab Results  Component Value Date   TSH 0.69 07/19/2016      Assessment & Plan:  Post-RAI hypothyroidism: well-replaced.  Please continue the same medication.

## 2016-07-19 NOTE — Patient Instructions (Signed)
blood tests are requested for you today.  We'll let you know about the results.   We don't need to recheck the ultrasound.   Please return in 1 year.

## 2016-07-31 ENCOUNTER — Ambulatory Visit (INDEPENDENT_AMBULATORY_CARE_PROVIDER_SITE_OTHER): Payer: Medicare Other | Admitting: *Deleted

## 2016-07-31 DIAGNOSIS — Z7901 Long term (current) use of anticoagulants: Secondary | ICD-10-CM

## 2016-07-31 DIAGNOSIS — I4891 Unspecified atrial fibrillation: Secondary | ICD-10-CM

## 2016-07-31 LAB — POCT INR: INR: 2.6

## 2016-08-18 ENCOUNTER — Other Ambulatory Visit: Payer: Self-pay | Admitting: Internal Medicine

## 2016-08-26 ENCOUNTER — Other Ambulatory Visit: Payer: Self-pay | Admitting: Internal Medicine

## 2016-08-28 ENCOUNTER — Ambulatory Visit (INDEPENDENT_AMBULATORY_CARE_PROVIDER_SITE_OTHER): Payer: Medicare Other | Admitting: Pharmacist

## 2016-08-28 DIAGNOSIS — I4891 Unspecified atrial fibrillation: Secondary | ICD-10-CM

## 2016-08-28 DIAGNOSIS — Z7901 Long term (current) use of anticoagulants: Secondary | ICD-10-CM | POA: Diagnosis not present

## 2016-08-28 LAB — POCT INR: INR: 2.5

## 2016-09-02 ENCOUNTER — Other Ambulatory Visit: Payer: Self-pay | Admitting: Internal Medicine

## 2016-09-02 DIAGNOSIS — H401123 Primary open-angle glaucoma, left eye, severe stage: Secondary | ICD-10-CM | POA: Diagnosis not present

## 2016-09-02 DIAGNOSIS — H401112 Primary open-angle glaucoma, right eye, moderate stage: Secondary | ICD-10-CM | POA: Diagnosis not present

## 2016-09-02 MED ORDER — HYDROCHLOROTHIAZIDE 12.5 MG PO CAPS: 12.5000 mg | ORAL_CAPSULE | Freq: Every day | ORAL | 0 refills | Status: DC

## 2016-09-02 NOTE — Telephone Encounter (Signed)
Patient has scheduled appt for 12/1st.  She is requesting enough medication to get her though until this date.  Patient states she only has enough medication to get her through until 11/19th.

## 2016-09-02 NOTE — Addendum Note (Signed)
Addended by: Lyman Bishop on: 09/02/2016 11:52 AM   Modules accepted: Orders

## 2016-09-02 NOTE — Telephone Encounter (Signed)
30 day supply sent with additional instruction to keep appointment for further refills

## 2016-09-11 DIAGNOSIS — H401112 Primary open-angle glaucoma, right eye, moderate stage: Secondary | ICD-10-CM | POA: Diagnosis not present

## 2016-09-11 DIAGNOSIS — H401123 Primary open-angle glaucoma, left eye, severe stage: Secondary | ICD-10-CM | POA: Diagnosis not present

## 2016-09-20 ENCOUNTER — Other Ambulatory Visit (INDEPENDENT_AMBULATORY_CARE_PROVIDER_SITE_OTHER): Payer: Medicare Other

## 2016-09-20 ENCOUNTER — Encounter: Payer: Self-pay | Admitting: Internal Medicine

## 2016-09-20 ENCOUNTER — Ambulatory Visit (INDEPENDENT_AMBULATORY_CARE_PROVIDER_SITE_OTHER): Payer: Medicare Other | Admitting: Internal Medicine

## 2016-09-20 VITALS — BP 140/80 | HR 82 | Resp 20 | Wt 214.0 lb

## 2016-09-20 DIAGNOSIS — Z0000 Encounter for general adult medical examination without abnormal findings: Secondary | ICD-10-CM

## 2016-09-20 LAB — CBC WITH DIFFERENTIAL/PLATELET
BASOS ABS: 0.1 10*3/uL (ref 0.0–0.1)
Basophils Relative: 0.5 % (ref 0.0–3.0)
EOS PCT: 2 % (ref 0.0–5.0)
Eosinophils Absolute: 0.2 10*3/uL (ref 0.0–0.7)
HEMATOCRIT: 42.4 % (ref 36.0–46.0)
Hemoglobin: 14.3 g/dL (ref 12.0–15.0)
LYMPHS ABS: 2.9 10*3/uL (ref 0.7–4.0)
LYMPHS PCT: 27.9 % (ref 12.0–46.0)
MCHC: 33.7 g/dL (ref 30.0–36.0)
MCV: 94.4 fl (ref 78.0–100.0)
MONOS PCT: 9.8 % (ref 3.0–12.0)
Monocytes Absolute: 1 10*3/uL (ref 0.1–1.0)
NEUTROS ABS: 6.1 10*3/uL (ref 1.4–7.7)
Neutrophils Relative %: 59.8 % (ref 43.0–77.0)
Platelets: 242 10*3/uL (ref 150.0–400.0)
RBC: 4.5 Mil/uL (ref 3.87–5.11)
RDW: 14.2 % (ref 11.5–15.5)
WBC: 10.2 10*3/uL (ref 4.0–10.5)

## 2016-09-20 LAB — LIPID PANEL
CHOL/HDL RATIO: 5
Cholesterol: 216 mg/dL — ABNORMAL HIGH (ref 0–200)
HDL: 39.6 mg/dL (ref >39.00)
LDL CALC: 144 mg/dL — AB (ref 0–99)
NONHDL: 176.37
Triglycerides: 162 mg/dL — ABNORMAL HIGH (ref 0.0–149.0)
VLDL: 32.4 mg/dL (ref 0.0–40.0)

## 2016-09-20 LAB — HEPATIC FUNCTION PANEL
ALK PHOS: 42 U/L (ref 39–117)
ALT: 17 U/L (ref 0–35)
AST: 18 U/L (ref 0–37)
Albumin: 3.9 g/dL (ref 3.5–5.2)
BILIRUBIN DIRECT: 0.1 mg/dL (ref 0.0–0.3)
TOTAL PROTEIN: 7.8 g/dL (ref 6.0–8.3)
Total Bilirubin: 0.5 mg/dL (ref 0.2–1.2)

## 2016-09-20 LAB — BASIC METABOLIC PANEL
BUN: 10 mg/dL (ref 6–23)
CALCIUM: 9.5 mg/dL (ref 8.4–10.5)
CO2: 30 meq/L (ref 19–32)
CREATININE: 0.69 mg/dL (ref 0.40–1.20)
Chloride: 97 mEq/L (ref 96–112)
GFR: 105.52 mL/min (ref >60.00)
Glucose, Bld: 74 mg/dL (ref 70–99)
Potassium: 3.4 mEq/L — ABNORMAL LOW (ref 3.5–5.1)
SODIUM: 132 meq/L — AB (ref 135–145)

## 2016-09-20 LAB — TSH: TSH: 0.67 u[IU]/mL (ref 0.35–4.50)

## 2016-09-20 MED ORDER — LISINOPRIL 40 MG PO TABS: ORAL_TABLET | ORAL | 3 refills | Status: DC

## 2016-09-20 MED ORDER — HYDROCHLOROTHIAZIDE 12.5 MG PO CAPS: 12.5000 mg | ORAL_CAPSULE | Freq: Every day | ORAL | 3 refills | Status: DC

## 2016-09-20 NOTE — Assessment & Plan Note (Signed)

## 2016-09-20 NOTE — Patient Instructions (Signed)

## 2016-09-20 NOTE — Progress Notes (Signed)
Subjective:    Patient ID: Monica Mora, female    DOB: Jul 26, 1935, 81 y.o.   MRN: [id number]  HPI  Here for wellness and f/u;  Overall doing ok;  Pt denies Chest pain, worsening SOB, DOE, wheezing, orthopnea, PND, worsening LE edema, palpitations, dizziness or syncope.  Pt denies neurological change such as new headache, facial or extremity weakness.  Pt denies polydipsia, polyuria, or low sugar symptoms. Pt states overall good compliance with treatment and medications, good tolerability, and has been trying to follow appropriate diet.  Pt denies worsening depressive symptoms, suicidal ideation or panic. No fever, night sweats, wt loss, loss of appetite, or other constitutional symptoms.  Pt states good ability with ADL's, has low fall risk, home safety reviewed and adequate, no other significant changes in hearing or vision, and only occasionally active with exercise. Declines dxa.  Declines statin  Sees MDs Drs Leslie Andrea  No other new exam fidings. Due for eye exam Past Medical History:  Diagnosis Date  . Abnormal chest x-ray   . Atrial fibrillation ([place])   . CHF (congestive heart failure) ([place])   . Constrictive pericarditis    s/p pericardial window in 2002 in [place]  . Glaucoma   . H/O: hysterectomy 1992  . Heart murmur   . Hyperlipidemia   . Nontoxic multinodular goiter   . Other voice and resonance disorders   . TB (tuberculosis) 2002   positive  . Unspecified essential hypertension   . Unspecified hypothyroidism    Past Surgical History:  Procedure Laterality Date  . ABDOMINAL HYSTERECTOMY  1992  . PERICARDIAL WINDOW  2002  . Radioactive Iodine for hyperthyroidism/Multinodular goiter     s/p    reports that she quit smoking about 37 years ago. She has never used smokeless tobacco. She reports that she does not drink alcohol or use drugs. family history includes Arthritis in her father and mother; Asthma in her brother; Cancer in her sister; Cervical cancer in  her sister. No Known Allergies Current Outpatient Prescriptions on File Prior to Visit  Medication Sig Dispense Refill  . ALPHAGAN P 0.1 % SOLN     . b complex vitamins tablet Take 1 tablet by mouth daily.      . Cholecalciferol (VITAMIN D) 1000 UNITS capsule Take 1,000 Units by mouth daily.      . dorzolamide-timolol (COSOPT) 22.3-6.8 MG/ML ophthalmic solution Place into both eyes as directed.    . fish oil-omega-3 fatty acids 1000 MG capsule Take 1 capsule by mouth daily.     Marland Kitchen levothyroxine (SYNTHROID, LEVOTHROID) 75 MCG tablet TAKE 1 TABLET BY MOUTH EVERY DAY 90 tablet 2  . LUMIGAN 0.01 % SOLN     . Multiple Vitamins-Minerals (CENTRUM) tablet Take 1 tablet by mouth daily.      . TOPROL XL 50 MG 24 hr tablet Take 1 tablet (50 mg total) by mouth daily. Take with or immediately following a meal. 90 tablet 3  . warfarin (COUMADIN) 5 MG tablet TAKE 1 TABLET BY MOUTH AS DIRECTED 90 tablet 1   No current facility-administered medications on file prior to visit.    Review of Systems  Constitutional: Negative for increased diaphoresis, or other activity, appetite or siginficant weight change other than noted HENT: Negative for worsening hearing loss, ear pain, facial swelling, mouth sores and neck stiffness.   Eyes: Negative for other worsening pain, redness or visual disturbance.  Respiratory: Negative for choking or stridor Cardiovascular: Negative for other chest pain  and palpitations.  Gastrointestinal: Negative for worsening diarrhea, blood in stool, or abdominal distention Genitourinary: Negative for hematuria, flank pain or change in urine volume.  Musculoskeletal: Negative for myalgias or other joint complaints.  Skin: Negative for other color change and wound or drainage.  Neurological: Negative for syncope and numbness. other than noted Hematological: Negative for adenopathy. or other swelling Psychiatric/Behavioral: Negative for hallucinations, SI, self-injury, decreased  concentration or other worsening agitation.  All other system neg per p t       Objective:   Physical Exam BP 140/80   Pulse 82   Resp 20   Wt 214 lb (97.1 kg)   SpO2 91%   BMI 33.02 kg/m  VS noted,  Constitutional: Pt is oriented to person, place, and time. Appears well-developed and well-nourished, in no significant distress Head: Normocephalic and atraumatic  Eyes: Conjunctivae and EOM are normal. Pupils are equal, round, and reactive to light Right Ear: External ear normal.  Left Ear: External ear normal Nose: Nose normal.  Mouth/Throat: Oropharynx is clear and moist  Neck: Normal range of motion. Neck supple. No JVD present. No tracheal deviation present or significant neck LA or mass Cardiovascular: Normal rate, regular rhythm, normal heart sounds and intact distal pulses.   Pulmonary/Chest: Effort normal and breath sounds without rales or wheezing  Abdominal: Soft. Bowel sounds are normal. NT. No HSM  Musculoskeletal: Normal range of motion. Exhibits no edema Lymphadenopathy: Has no cervical adenopathy.  Neurological: Pt is alert and oriented to person, place, and time. Pt has normal reflexes. No cranial nerve deficit. Motor grossly intact Skin: Skin is warm and dry. No rash noted or new ulcers Psychiatric:  Has normal mood and affect. Behavior is normal.  No other new exam findings    Assessment & Plan:

## 2016-09-20 NOTE — Progress Notes (Signed)
Pre visit review using our clinic review tool, if applicable. No additional management support is needed unless otherwise documented below in the visit note. 

## 2016-09-25 ENCOUNTER — Encounter: Payer: Self-pay | Admitting: Internal Medicine

## 2016-09-25 ENCOUNTER — Ambulatory Visit (INDEPENDENT_AMBULATORY_CARE_PROVIDER_SITE_OTHER): Payer: Medicare Other | Admitting: Pharmacist

## 2016-09-25 ENCOUNTER — Other Ambulatory Visit (INDEPENDENT_AMBULATORY_CARE_PROVIDER_SITE_OTHER): Payer: Medicare Other

## 2016-09-25 DIAGNOSIS — I4891 Unspecified atrial fibrillation: Secondary | ICD-10-CM | POA: Diagnosis not present

## 2016-09-25 DIAGNOSIS — Z0000 Encounter for general adult medical examination without abnormal findings: Secondary | ICD-10-CM | POA: Diagnosis not present

## 2016-09-25 DIAGNOSIS — Z7901 Long term (current) use of anticoagulants: Secondary | ICD-10-CM | POA: Diagnosis not present

## 2016-09-25 LAB — URINALYSIS, ROUTINE W REFLEX MICROSCOPIC
Bilirubin Urine: NEGATIVE
Hgb urine dipstick: NEGATIVE
KETONES UR: NEGATIVE
Leukocytes, UA: NEGATIVE
Nitrite: NEGATIVE
RBC / HPF: NONE SEEN (ref 0–?)
SPECIFIC GRAVITY, URINE: 1.01 (ref 1.000–1.030)
Total Protein, Urine: NEGATIVE
UROBILINOGEN UA: 0.2 (ref 0.0–1.0)
Urine Glucose: NEGATIVE
pH: 6 (ref 5.0–8.0)

## 2016-09-25 LAB — POCT INR: INR: 2.6

## 2016-11-13 ENCOUNTER — Other Ambulatory Visit: Payer: Self-pay | Admitting: Internal Medicine

## 2016-11-13 ENCOUNTER — Ambulatory Visit (INDEPENDENT_AMBULATORY_CARE_PROVIDER_SITE_OTHER): Payer: Medicare Other | Admitting: *Deleted

## 2016-11-13 DIAGNOSIS — I4891 Unspecified atrial fibrillation: Secondary | ICD-10-CM | POA: Diagnosis not present

## 2016-11-13 DIAGNOSIS — Z7901 Long term (current) use of anticoagulants: Secondary | ICD-10-CM

## 2016-11-13 LAB — POCT INR: INR: 1.7

## 2016-11-18 ENCOUNTER — Other Ambulatory Visit: Payer: Self-pay | Admitting: Internal Medicine

## 2016-11-18 NOTE — Telephone Encounter (Signed)
Medication Detail    Disp Refills Start End   TOPROL XL 50 MG 24 hr tablet 90 tablet 1 11/13/2016    Sig - Route: TAKE 1 TABLET (50 MG TOTAL) BY MOUTH DAILY. TAKE WITH OR IMMEDIATELY FOLLOWING A MEAL. - Oral   E-Prescribing Status: Receipt confirmed by pharmacy (11/13/2016 3:24 PM EST)   Pharmacy   [id number] [place]

## 2016-11-27 ENCOUNTER — Encounter (INDEPENDENT_AMBULATORY_CARE_PROVIDER_SITE_OTHER): Payer: Self-pay

## 2016-11-27 ENCOUNTER — Ambulatory Visit (INDEPENDENT_AMBULATORY_CARE_PROVIDER_SITE_OTHER): Payer: Medicare Other | Admitting: *Deleted

## 2016-11-27 DIAGNOSIS — I4891 Unspecified atrial fibrillation: Secondary | ICD-10-CM | POA: Diagnosis not present

## 2016-11-27 DIAGNOSIS — Z7901 Long term (current) use of anticoagulants: Secondary | ICD-10-CM | POA: Diagnosis not present

## 2016-11-27 LAB — POCT INR: INR: 2.8

## 2016-12-20 ENCOUNTER — Ambulatory Visit (INDEPENDENT_AMBULATORY_CARE_PROVIDER_SITE_OTHER): Payer: Medicare Other

## 2016-12-20 DIAGNOSIS — I4891 Unspecified atrial fibrillation: Secondary | ICD-10-CM | POA: Diagnosis not present

## 2016-12-20 DIAGNOSIS — Z7901 Long term (current) use of anticoagulants: Secondary | ICD-10-CM | POA: Diagnosis not present

## 2016-12-20 LAB — POCT INR: INR: 2.4

## 2017-01-01 ENCOUNTER — Other Ambulatory Visit: Payer: Self-pay | Admitting: Endocrinology

## 2017-01-15 DIAGNOSIS — H401112 Primary open-angle glaucoma, right eye, moderate stage: Secondary | ICD-10-CM | POA: Diagnosis not present

## 2017-01-15 DIAGNOSIS — H401123 Primary open-angle glaucoma, left eye, severe stage: Secondary | ICD-10-CM | POA: Diagnosis not present

## 2017-01-22 ENCOUNTER — Ambulatory Visit (INDEPENDENT_AMBULATORY_CARE_PROVIDER_SITE_OTHER): Payer: Medicare Other | Admitting: *Deleted

## 2017-01-22 DIAGNOSIS — I4891 Unspecified atrial fibrillation: Secondary | ICD-10-CM | POA: Diagnosis not present

## 2017-01-22 DIAGNOSIS — Z7901 Long term (current) use of anticoagulants: Secondary | ICD-10-CM

## 2017-01-22 LAB — POCT INR: INR: 3.2

## 2017-02-04 ENCOUNTER — Encounter: Payer: Self-pay | Admitting: Internal Medicine

## 2017-02-10 ENCOUNTER — Ambulatory Visit: Payer: Medicare Other | Admitting: Internal Medicine

## 2017-02-12 ENCOUNTER — Ambulatory Visit (INDEPENDENT_AMBULATORY_CARE_PROVIDER_SITE_OTHER): Payer: Medicare Other | Admitting: *Deleted

## 2017-02-12 ENCOUNTER — Encounter (INDEPENDENT_AMBULATORY_CARE_PROVIDER_SITE_OTHER): Payer: Self-pay

## 2017-02-12 DIAGNOSIS — I4891 Unspecified atrial fibrillation: Secondary | ICD-10-CM | POA: Diagnosis not present

## 2017-02-12 DIAGNOSIS — Z7901 Long term (current) use of anticoagulants: Secondary | ICD-10-CM | POA: Diagnosis not present

## 2017-02-12 LAB — POCT INR: INR: 2.4

## 2017-02-17 DIAGNOSIS — H401123 Primary open-angle glaucoma, left eye, severe stage: Secondary | ICD-10-CM | POA: Diagnosis not present

## 2017-02-17 DIAGNOSIS — H02403 Unspecified ptosis of bilateral eyelids: Secondary | ICD-10-CM | POA: Diagnosis not present

## 2017-02-17 DIAGNOSIS — H401112 Primary open-angle glaucoma, right eye, moderate stage: Secondary | ICD-10-CM | POA: Diagnosis not present

## 2017-02-17 DIAGNOSIS — H25813 Combined forms of age-related cataract, bilateral: Secondary | ICD-10-CM | POA: Diagnosis not present

## 2017-02-21 ENCOUNTER — Encounter: Payer: Self-pay | Admitting: Internal Medicine

## 2017-02-21 ENCOUNTER — Ambulatory Visit (INDEPENDENT_AMBULATORY_CARE_PROVIDER_SITE_OTHER): Payer: Medicare Other | Admitting: Internal Medicine

## 2017-02-21 VITALS — BP 130/64 | HR 66 | Ht 67.5 in | Wt 212.0 lb

## 2017-02-21 DIAGNOSIS — I10 Essential (primary) hypertension: Secondary | ICD-10-CM

## 2017-02-21 NOTE — Progress Notes (Signed)
Pt cancelled appt

## 2017-02-24 ENCOUNTER — Telehealth: Payer: Self-pay | Admitting: Internal Medicine

## 2017-02-24 NOTE — Telephone Encounter (Signed)
Pt was wondering who Dr. Jenny Reichmann would recommend for an ENT

## 2017-02-25 NOTE — Telephone Encounter (Signed)
I think Dr Radene Journey or Dr Minna Merritts would be good choice.

## 2017-02-25 NOTE — Telephone Encounter (Signed)
Called pt left VM with info below

## 2017-03-12 ENCOUNTER — Ambulatory Visit (INDEPENDENT_AMBULATORY_CARE_PROVIDER_SITE_OTHER): Payer: Medicare Other | Admitting: *Deleted

## 2017-03-12 DIAGNOSIS — Z7901 Long term (current) use of anticoagulants: Secondary | ICD-10-CM | POA: Diagnosis not present

## 2017-03-12 DIAGNOSIS — I4891 Unspecified atrial fibrillation: Secondary | ICD-10-CM | POA: Diagnosis not present

## 2017-03-12 LAB — POCT INR: INR: 2.2

## 2017-03-19 DIAGNOSIS — I8311 Varicose veins of right lower extremity with inflammation: Secondary | ICD-10-CM | POA: Diagnosis not present

## 2017-03-19 DIAGNOSIS — I8312 Varicose veins of left lower extremity with inflammation: Secondary | ICD-10-CM | POA: Diagnosis not present

## 2017-03-23 NOTE — Progress Notes (Signed)
Cardiology Office Note   Date:  03/24/2017   ID:  Monica Mora, DOB [birth year], MRN [id number]  PCP:  Biagio Borg, MD  Cardiologist:   Dorris Carnes, MD   F/U of PAF     History of Present Illness: Monica Mora is a 82 y.o. female with a history of PAF, pericarditis.  I saw her in July 2017   Since seen the pt denies CP  No signif palpitations  Breathing is OK       Current Meds  Medication Sig  . ALPHAGAN P 0.1 % SOLN   . b complex vitamins tablet Take 1 tablet by mouth daily.    . Cholecalciferol (VITAMIN D) 1000 UNITS capsule Take 1,000 Units by mouth daily.    . dorzolamide-timolol (COSOPT) 22.3-6.8 MG/ML ophthalmic solution Place into both eyes as directed.  . fish oil-omega-3 fatty acids 1000 MG capsule Take 1 capsule by mouth daily.   . hydrochlorothiazide (MICROZIDE) 12.5 MG capsule Take 1 capsule (12.5 mg total) by mouth daily.  Marland Kitchen levothyroxine (SYNTHROID, LEVOTHROID) 75 MCG tablet TAKE 1 TABLET BY MOUTH EVERY DAY  . lisinopril (PRINIVIL,ZESTRIL) 40 MG tablet TAKE 1 TABLET BY MOUTH DAILY  . LUMIGAN 0.01 % SOLN   . Multiple Vitamins-Minerals (CENTRUM) tablet Take 1 tablet by mouth daily.    . TOPROL XL 50 MG 24 hr tablet TAKE 1 TABLET (50 MG TOTAL) BY MOUTH DAILY. TAKE WITH OR IMMEDIATELY FOLLOWING A MEAL.  Marland Kitchen warfarin (COUMADIN) 5 MG tablet TAKE 1 TABLET BY MOUTH AS DIRECTED     Allergies:   Patient has no known allergies.   Past Medical History:  Diagnosis Date  . Abnormal chest x-ray   . Atrial fibrillation ([place])   . CHF (congestive heart failure) ([place])   . Constrictive pericarditis    s/p pericardial window in 2002 in [place]  . Glaucoma   . H/O: hysterectomy 1992  . Heart murmur   . Hyperlipidemia   . Nontoxic multinodular goiter   . Other voice and resonance disorders   . TB (tuberculosis) 2002   positive  . Unspecified essential hypertension   . Unspecified hypothyroidism     Past Surgical History:  Procedure Laterality Date  .  ABDOMINAL HYSTERECTOMY  1992  . PERICARDIAL WINDOW  2002  . Radioactive Iodine for hyperthyroidism/Multinodular goiter     s/p     Social History:  The patient  reports that she quit smoking about 38 years ago. She has never used smokeless tobacco. She reports that she does not drink alcohol or use drugs.   Family History:  The patient's family history includes Arthritis in her father and mother; Asthma in her brother; Cancer in her sister; Cervical cancer in her sister.    ROS:  Please see the history of present illness. All other systems are reviewed and  Negative to the above problem except as noted.    PHYSICAL EXAM: VS:  BP 128/62   Pulse 63   Ht 5' 7.5" (1.715 m)   Wt 94.4 kg (208 lb 3.2 oz)   BMI 32.13 kg/m   GEN: Well nourished, well developed, in no acute distress  HEENT: normal  Neck: no JVD, carotid bruits, or masses Cardiac: RRR; no murmurs, rubs, or gallops,no edema  Respiratory:  clear to auscultation bilaterally, normal work of breathing GI: soft, nontender, nondistended, + BS  No hepatomegaly  MS: no deformity Moving all extremities   Skin: warm and dry, no rash Neuro:  Strength and sensation are intact Psych: euthymic mood, full affect   EKG:  EKG is not ordered today.   Lipid Panel    Component Value Date/Time   CHOL 216 (H) 09/20/2016 1641   TRIG 162.0 (H) 09/20/2016 1641   HDL 39.60 09/20/2016 1641   CHOLHDL 5 09/20/2016 1641   VLDL 32.4 09/20/2016 1641   LDLCALC 144 (H) 09/20/2016 1641   LDLDIRECT 149.7 07/17/2012 0849      Wt Readings from Last 3 Encounters:  03/24/17 94.4 kg (208 lb 3.2 oz)  02/21/17 96.2 kg (212 lb)  09/20/16 97.1 kg (214 lb)      ASSESSMENT AND PLAN: 1  PAF  No symptoms to sugg signif arrhythmia  Keep on current regimen   2  HTN   Good control  3  HL  WIll check fasting lipids   Wll set up for labs on Friday (CBC, BMET and lipid)  F/U in clinic in Feb      Current medicines are reviewed at length with the  patient today.  The patient does not have concerns regarding medicines.  Signed, Dorris Carnes, MD  03/24/2017 11:36 AM    [address], [place], [place]  [address] Phone: [phone]; Fax: [phone]

## 2017-03-24 ENCOUNTER — Ambulatory Visit (INDEPENDENT_AMBULATORY_CARE_PROVIDER_SITE_OTHER): Payer: Medicare Other | Admitting: Internal Medicine

## 2017-03-24 ENCOUNTER — Encounter: Payer: Self-pay | Admitting: Internal Medicine

## 2017-03-24 VITALS — BP 128/62 | HR 63 | Ht 67.5 in | Wt 208.2 lb

## 2017-03-24 DIAGNOSIS — I10 Essential (primary) hypertension: Secondary | ICD-10-CM

## 2017-03-24 DIAGNOSIS — E782 Mixed hyperlipidemia: Secondary | ICD-10-CM

## 2017-03-24 DIAGNOSIS — I480 Paroxysmal atrial fibrillation: Secondary | ICD-10-CM

## 2017-03-24 NOTE — Patient Instructions (Addendum)
Medication Instructions:  Your physician recommends that you continue on your current medications as directed. Please refer to the Current Medication list given to you today.   Labwork: Your physician recommends that you return for lab work on Friday June 8. You will need to fast for 6 hours before lab work. Continue to drink water during this time.    Testing/Procedures: None Ordered   Follow-Up: Your physician wants you to follow-up in: 8 months (February 2019) with Dr. Harrington Challenger. You will receive a reminder letter in the mail two months in advance. If you don't receive a letter, please call our office to schedule the follow-up appointment.   If you need a refill on your cardiac medications before your next appointment, please call your pharmacy.   Thank you for choosing [facility]! Christen Bame, RN [phone]

## 2017-03-28 ENCOUNTER — Ambulatory Visit (INDEPENDENT_AMBULATORY_CARE_PROVIDER_SITE_OTHER): Payer: Medicare Other | Admitting: Pharmacist

## 2017-03-28 ENCOUNTER — Other Ambulatory Visit: Payer: Self-pay | Admitting: Internal Medicine

## 2017-03-28 ENCOUNTER — Other Ambulatory Visit: Payer: Medicare Other | Admitting: *Deleted

## 2017-03-28 DIAGNOSIS — I4891 Unspecified atrial fibrillation: Secondary | ICD-10-CM | POA: Diagnosis not present

## 2017-03-28 DIAGNOSIS — I480 Paroxysmal atrial fibrillation: Secondary | ICD-10-CM | POA: Diagnosis not present

## 2017-03-28 DIAGNOSIS — Z7901 Long term (current) use of anticoagulants: Secondary | ICD-10-CM

## 2017-03-28 DIAGNOSIS — E782 Mixed hyperlipidemia: Secondary | ICD-10-CM

## 2017-03-28 LAB — POCT INR: INR: 2.5

## 2017-03-29 LAB — BASIC METABOLIC PANEL
BUN/Creatinine Ratio: 11 — ABNORMAL LOW (ref 12–28)
BUN: 7 mg/dL — AB (ref 8–27)
CO2: 23 mmol/L (ref 18–29)
CREATININE: 0.65 mg/dL (ref 0.57–1.00)
Calcium: 9.3 mg/dL (ref 8.7–10.3)
Chloride: 98 mmol/L (ref 96–106)
GFR, EST AFRICAN AMERICAN: 98 mL/min/{1.73_m2} (ref >59)
GFR, EST NON AFRICAN AMERICAN: 85 mL/min/{1.73_m2} (ref >59)
GLUCOSE: 77 mg/dL (ref 65–99)
Potassium: 3.9 mmol/L (ref 3.5–5.2)
SODIUM: 135 mmol/L (ref 134–144)

## 2017-03-29 LAB — LIPID PANEL
CHOLESTEROL TOTAL: 201 mg/dL — AB (ref 100–199)
Chol/HDL Ratio: 5.3 ratio — ABNORMAL HIGH (ref 0.0–4.4)
HDL: 38 mg/dL — AB (ref >39)
LDL Calculated: 137 mg/dL — ABNORMAL HIGH (ref 0–99)
TRIGLYCERIDES: 128 mg/dL (ref 0–149)
VLDL Cholesterol Cal: 26 mg/dL (ref 5–40)

## 2017-03-29 LAB — CBC WITH DIFFERENTIAL/PLATELET
BASOS: 1 %
Basophils Absolute: 0.1 10*3/uL (ref 0.0–0.2)
EOS (ABSOLUTE): 0.2 10*3/uL (ref 0.0–0.4)
EOS: 2 %
HEMATOCRIT: 40.6 % (ref 34.0–46.6)
HEMOGLOBIN: 14 g/dL (ref 11.1–15.9)
Immature Grans (Abs): 0 10*3/uL (ref 0.0–0.1)
Immature Granulocytes: 0 %
LYMPHS ABS: 3.5 10*3/uL — AB (ref 0.7–3.1)
Lymphs: 38 %
MCH: 32.1 pg (ref 26.6–33.0)
MCHC: 34.5 g/dL (ref 31.5–35.7)
MCV: 93 fL (ref 79–97)
MONOCYTES: 7 %
MONOS ABS: 0.6 10*3/uL (ref 0.1–0.9)
Neutrophils Absolute: 5 10*3/uL (ref 1.4–7.0)
Neutrophils: 52 %
Platelets: 244 10*3/uL (ref 150–379)
RBC: 4.36 x10E6/uL (ref 3.77–5.28)
RDW: 14.2 % (ref 12.3–15.4)
WBC: 9.3 10*3/uL (ref 3.4–10.8)

## 2017-03-31 DIAGNOSIS — J310 Chronic rhinitis: Secondary | ICD-10-CM | POA: Diagnosis not present

## 2017-03-31 DIAGNOSIS — R6884 Jaw pain: Secondary | ICD-10-CM | POA: Diagnosis not present

## 2017-04-25 ENCOUNTER — Other Ambulatory Visit: Payer: Self-pay | Admitting: Internal Medicine

## 2017-04-29 DIAGNOSIS — I8311 Varicose veins of right lower extremity with inflammation: Secondary | ICD-10-CM | POA: Diagnosis not present

## 2017-04-29 DIAGNOSIS — I8312 Varicose veins of left lower extremity with inflammation: Secondary | ICD-10-CM | POA: Diagnosis not present

## 2017-05-07 ENCOUNTER — Ambulatory Visit (INDEPENDENT_AMBULATORY_CARE_PROVIDER_SITE_OTHER): Payer: Medicare Other | Admitting: Pharmacist

## 2017-05-07 DIAGNOSIS — Z7901 Long term (current) use of anticoagulants: Secondary | ICD-10-CM | POA: Diagnosis not present

## 2017-05-07 DIAGNOSIS — I4891 Unspecified atrial fibrillation: Secondary | ICD-10-CM

## 2017-05-07 LAB — POCT INR: INR: 2.7

## 2017-05-14 ENCOUNTER — Other Ambulatory Visit: Payer: Self-pay | Admitting: Internal Medicine

## 2017-05-14 DIAGNOSIS — H401123 Primary open-angle glaucoma, left eye, severe stage: Secondary | ICD-10-CM | POA: Diagnosis not present

## 2017-05-14 DIAGNOSIS — H401112 Primary open-angle glaucoma, right eye, moderate stage: Secondary | ICD-10-CM | POA: Diagnosis not present

## 2017-05-14 DIAGNOSIS — H2513 Age-related nuclear cataract, bilateral: Secondary | ICD-10-CM | POA: Diagnosis not present

## 2017-05-20 ENCOUNTER — Telehealth: Payer: Self-pay | Admitting: Internal Medicine

## 2017-05-20 NOTE — Telephone Encounter (Signed)
Patient Name: Monica Mora  DOB: 1935/02/18    Initial Comment Caller say she has been seen about a gastric reflux, She was having feeling some discomfort around the upper part of her chest above the breast.    Nurse Assessment  Nurse: Raphael Gibney, RN, Vanita Ingles Date/Time (Eastern Time): 05/20/2017 10:56:06 AM  Confirm and document reason for call. If symptomatic, describe symptoms. ---Caller states she had gastric reflux a couple of years. Had some discomfort above the breast. Chest discomfort happened around 2 am. No discomfort now. No SOB. Intermittent chest discomfort for 4 hrs.  Does the patient have any new or worsening symptoms? ---Yes  Will a triage be completed? ---Yes  Related visit to physician within the last 2 weeks? ---No  Does the PT have any chronic conditions? (i.e. diabetes, asthma, etc.) ---Yes  List chronic conditions. ---heart surgery; CHF  Is this a behavioral health or substance abuse call? ---No     Guidelines    Guideline Title Affirmed Question Affirmed Notes  Chest Pain Chest pain lasts > 5 minutes (Exceptions: chest pain occurring > 3 days ago and now asymptomatic; same as previously diagnosed heartburn and has accompanying sour taste in mouth)    Final Disposition User   Go to ED Now (or PCP triage) Raphael Gibney, RN, Vanita Ingles    Comments  pt states she has an appt on Thursday but does not want to go to the ER or urgent care.  called back line at office and gave report to [facility] that pt was having upper left chest discomfort off and on during the night but not now. Triage outcome of go to ER now (or PCP triage). Pt has appt on Thursday and will just keep that.   Referrals  GO TO FACILITY REFUSED   Disagree/Comply: Disagree  Disagree/Comply Reason: Disagree with instructions

## 2017-05-20 NOTE — Telephone Encounter (Signed)
Ok to offer pt appt for chest pain if does not already have an appt

## 2017-05-22 ENCOUNTER — Ambulatory Visit (INDEPENDENT_AMBULATORY_CARE_PROVIDER_SITE_OTHER)
Admission: RE | Admit: 2017-05-22 | Discharge: 2017-05-22 | Disposition: A | Discharge status: Home / Self Care | Payer: Medicare Other | Source: Ambulatory Visit | Attending: Internal Medicine | Admitting: Internal Medicine

## 2017-05-22 ENCOUNTER — Ambulatory Visit (INDEPENDENT_AMBULATORY_CARE_PROVIDER_SITE_OTHER): Payer: Medicare Other | Admitting: Internal Medicine

## 2017-05-22 ENCOUNTER — Encounter: Payer: Self-pay | Admitting: Internal Medicine

## 2017-05-22 VITALS — BP 128/76 | HR 66 | Ht 67.5 in | Wt 206.0 lb

## 2017-05-22 DIAGNOSIS — R079 Chest pain, unspecified: Secondary | ICD-10-CM | POA: Diagnosis not present

## 2017-05-22 DIAGNOSIS — I10 Essential (primary) hypertension: Secondary | ICD-10-CM | POA: Diagnosis not present

## 2017-05-22 DIAGNOSIS — K219 Gastro-esophageal reflux disease without esophagitis: Secondary | ICD-10-CM | POA: Diagnosis not present

## 2017-05-22 DIAGNOSIS — R0600 Dyspnea, unspecified: Secondary | ICD-10-CM | POA: Diagnosis not present

## 2017-05-22 DIAGNOSIS — R0609 Other forms of dyspnea: Secondary | ICD-10-CM

## 2017-05-22 NOTE — Progress Notes (Signed)
Subjective:    Patient ID: Monica Mora, female    DOB: May 21, 1935, 82 y.o.   MRN: [id number]  HPI  Here with episode of CP starting Mon about 130 am, woke her up, hard to go back to sleep, slept then woke again at 6am, then none after that; pressure like, located just below the neck in the midline upper sternal area, no radiation, sob with the pain, n/v, palp or diaphoresis; graudally resolved over about 12 hrs, intermittent during that time.  No recurrence. Did not take anything such as h20 or TUMS.  Denies worsening reflux, abd pain, dysphagia, n/v, bowel change or blood. Pt denies new neurological symptoms such as new headache, or facial or extremity weakness or numbness  Pt denies polydipsia, polyuria,  But apart from this also c/o mild dyspnea usually only with exertion, but not activity limiting and not assoc with cough, fever , wheezing or LE edema Past Medical History:  Diagnosis Date  . Abnormal chest x-ray   . Atrial fibrillation ([place])   . CHF (congestive heart failure) ([place])   . Constrictive pericarditis    s/p pericardial window in 2002 in [place]  . Glaucoma   . H/O: hysterectomy 1992  . Heart murmur   . Hyperlipidemia   . Nontoxic multinodular goiter   . Other voice and resonance disorders   . TB (tuberculosis) 2002   positive  . Unspecified essential hypertension   . Unspecified hypothyroidism    Past Surgical History:  Procedure Laterality Date  . ABDOMINAL HYSTERECTOMY  1992  . PERICARDIAL WINDOW  2002  . Radioactive Iodine for hyperthyroidism/Multinodular goiter     s/p    reports that she quit smoking about 38 years ago. She has never used smokeless tobacco. She reports that she does not drink alcohol or use drugs. family history includes Arthritis in her father and mother; Asthma in her brother; Cancer in her sister; Cervical cancer in her sister. No Known Allergies Current Outpatient Prescriptions on File Prior to Visit  Medication Sig Dispense  Refill  . ALPHAGAN P 0.1 % SOLN     . b complex vitamins tablet Take 1 tablet by mouth daily.      . Cholecalciferol (VITAMIN D) 1000 UNITS capsule Take 1,000 Units by mouth daily.      . dorzolamide-timolol (COSOPT) 22.3-6.8 MG/ML ophthalmic solution Place into both eyes as directed.    . fish oil-omega-3 fatty acids 1000 MG capsule Take 1 capsule by mouth daily.     . hydrochlorothiazide (MICROZIDE) 12.5 MG capsule Take 1 capsule (12.5 mg total) by mouth daily. 90 capsule 3  . levothyroxine (SYNTHROID, LEVOTHROID) 75 MCG tablet TAKE 1 TABLET BY MOUTH EVERY DAY 90 tablet 2  . lisinopril (PRINIVIL,ZESTRIL) 40 MG tablet TAKE 1 TABLET BY MOUTH DAILY 90 tablet 3  . LUMIGAN 0.01 % SOLN     . Multiple Vitamins-Minerals (CENTRUM) tablet Take 1 tablet by mouth daily.      . TOPROL XL 50 MG 24 hr tablet TAKE 1 TABLET BY MOUTH DAILY. TAKE WITH OR IMMEDIATELY FOLLOWING A MEAL 90 tablet 3  . warfarin (COUMADIN) 5 MG tablet Take as directed by coumadin clinic 90 tablet 1   No current facility-administered medications on file prior to visit.    Review of Systems  Constitutional: Negative for other unusual diaphoresis or sweats HENT: Negative for ear discharge or swelling Eyes: Negative for other worsening visual disturbances Respiratory: Negative for stridor or other swelling  Gastrointestinal: Negative  for worsening distension or other blood Genitourinary: Negative for retention or other urinary change Musculoskeletal: Negative for other MSK pain or swelling Skin: Negative for color change or other new lesions Neurological: Negative for worsening tremors and other numbness  Psychiatric/Behavioral: Negative for worsening agitation or other fatigue All other system neg per pt    Objective:   Physical Exam BP 128/76   Pulse 66   Ht 5' 7.5" (1.715 m)   Wt 206 lb (93.4 kg)   SpO2 100%   BMI 31.79 kg/m  VS noted,  Constitutional: Pt appears in NAD HENT: Head: NCAT.  Right Ear: External ear  normal.  Left Ear: External ear normal.  Eyes: . Pupils are equal, round, and reactive to light. Conjunctivae and EOM are normal Nose: without d/c or deformity Neck: Neck supple. Gross normal ROM Cardiovascular: Normal rate and regular rhythm.   Pulmonary/Chest: Effort normal and breath sounds without rales or wheezing.  Abd:  Soft, NT, ND, + BS, no organomegaly Neurological: Pt is alert. At baseline orientation, motor grossly intact Skin: Skin is warm. No rashes, other new lesions, no LE edema Psychiatric: Pt behavior is normal without agitation  No other exam findings  ECG today which I have personally interpreted: Sinus  Rhythm  -  Negative precordial T-waves  -Probably normal    Assessment & Plan:

## 2017-05-22 NOTE — Patient Instructions (Signed)
Your EKG was OK today  Please keep TUMS at home to try as needed for further discomfort  Please continue all other medications as before, and refills have been done if requested.  Please have the pharmacy call with any other refills you may need.  Please keep your appointments with your specialists as you may have planned  You will be contacted regarding the referral for: Stress test, and the Lung testing (pulmonary function tests)  Please go to the XRAY Department in the Basement (go straight as you get off the elevator) for the x-ray testing  You will be contacted by phone if any changes need to be made immediately.  Otherwise, you will receive a letter about your results with an explanation, but please check with MyChart first.  Please remember to sign up for MyChart if you have not done so, as this will be important to you in the future with finding out test results, communicating by private email, and scheduling acute appointments online when needed.

## 2017-05-23 ENCOUNTER — Telehealth: Payer: Self-pay | Admitting: Internal Medicine

## 2017-05-23 ENCOUNTER — Encounter: Payer: Self-pay | Admitting: Internal Medicine

## 2017-05-23 MED ORDER — LEVOFLOXACIN 250 MG PO TABS: 250.0000 mg | ORAL_TABLET | Freq: Every day | ORAL | 0 refills | Status: AC

## 2017-05-25 NOTE — Assessment & Plan Note (Addendum)
Atypical, etiology unclear, for cxr and stress test r/o ischemia, o/w cont same tx

## 2017-05-25 NOTE — Assessment & Plan Note (Signed)
Exam benign, for PFT's for further evaluation, tx pending results

## 2017-05-25 NOTE — Assessment & Plan Note (Signed)
stable overall by history and exam, recent data reviewed with pt, and pt to continue medical treatment as before,  to f/u any worsening symptoms or concerns BP Readings from Last 3 Encounters:  05/22/17 128/76  03/24/17 128/62  02/21/17 130/64

## 2017-05-25 NOTE — Assessment & Plan Note (Signed)
Mild intermittent, declines zantac or PPI, for TUMS prn

## 2017-05-27 DIAGNOSIS — I8312 Varicose veins of left lower extremity with inflammation: Secondary | ICD-10-CM | POA: Diagnosis not present

## 2017-05-27 NOTE — Telephone Encounter (Signed)
Pt called wanting to know why she was prescribed LEVAQUIN She has not picked it up from the pharmacy  Please call back in regard

## 2017-05-27 NOTE — Telephone Encounter (Signed)
Dr John please advise.  

## 2017-05-28 NOTE — Telephone Encounter (Signed)
Called pt, LVM.   

## 2017-05-28 NOTE — Telephone Encounter (Signed)
Notes recorded by Biagio Borg, MD on 05/23/2017 at 12:30 PM EDT Left message on MyChart, pt to cont same tx except  The test results show that your current treatment is OK, except there may be mild pneumonia to the right lower lung found on the Chest Xray. We should treat with an antibiotic, and you should be called from the office about this as well. Please return to the office in 1 month for a repeat Chest xray to make sure is resolved, or sooner if you are note doing well, such as worsening shortness of breath and cough.   Shirron to please inform pt, I will do rx, and please help pt for ROV in 4 wks

## 2017-05-28 NOTE — Telephone Encounter (Signed)
Patient has called back today in regard.  I have told patient to please allow 24 to 48 hours on all return calls.

## 2017-05-28 NOTE — Telephone Encounter (Signed)
Left patient vm on both numbers to either return my call or Shirron's call to review Dr. Gwynn Burly response and scheduled 4 week fu.

## 2017-05-29 NOTE — Telephone Encounter (Signed)
Pt given Dr. Judi Cong response and expresseed understanding  32mo fu appt made for 9/13

## 2017-06-03 ENCOUNTER — Encounter (HOSPITAL_COMMUNITY): Payer: Medicare Other

## 2017-06-05 ENCOUNTER — Telehealth (HOSPITAL_COMMUNITY): Payer: Self-pay | Admitting: *Deleted

## 2017-06-05 ENCOUNTER — Ambulatory Visit: Payer: Medicare Other | Admitting: Internal Medicine

## 2017-06-05 LAB — PULMONARY FUNCTION TEST
DL/VA % PRED: 45 %
DL/VA: 2.32 ml/min/mmHg/L
DLCO cor % pred: 28 %
DLCO cor: 8.05 ml/min/mmHg
DLCO unc % pred: 29 %
DLCO unc: 8.26 ml/min/mmHg
FEF 25-75 Post: 3.24 L/sec
FEF 25-75 Pre: 2.97 L/sec
FEF2575-%CHANGE-POST: 9 %
FEF2575-%Pred-Post: 210 %
FEF2575-%Pred-Pre: 192 %
FEV1-%CHANGE-POST: -2 %
FEV1-%PRED-PRE: 108 %
FEV1-%Pred-Post: 105 %
FEV1-POST: 1.94 L
FEV1-Pre: 1.99 L
FEV1FVC-%CHANGE-POST: 1 %
FEV1FVC-%Pred-Pre: 120 %
FEV6-%CHANGE-POST: -3 %
FEV6-%PRED-PRE: 96 %
FEV6-%Pred-Post: 92 %
FEV6-Post: 2.12 L
FEV6-Pre: 2.19 L
FEV6FVC-%PRED-PRE: 104 %
FEV6FVC-%Pred-Post: 104 %
FVC-%CHANGE-POST: -3 %
FVC-%PRED-POST: 88 %
FVC-%PRED-PRE: 92 %
FVC-POST: 2.12 L
FVC-PRE: 2.19 L
POST FEV6/FVC RATIO: 100 %
PRE FEV6/FVC RATIO: 100 %
Post FEV1/FVC ratio: 92 %
Pre FEV1/FVC ratio: 91 %
RV % PRED: 65 %
RV: 1.66 L
TLC % PRED: 70 %
TLC: 3.85 L

## 2017-06-05 NOTE — Progress Notes (Signed)
PFT done today. 

## 2017-06-05 NOTE — Telephone Encounter (Signed)
Left message on voicemail in reference to upcoming appointment scheduled for 06/10/17. Phone number given for a call back so details instructions can be given.  Kirstie Peri

## 2017-06-06 ENCOUNTER — Telehealth (HOSPITAL_COMMUNITY): Payer: Self-pay

## 2017-06-06 NOTE — Telephone Encounter (Signed)
Patient given detailed instructions per Myocardial Perfusion Study Information Sheet for the test on 06/10/17 at 1000. Patient notified to arrive 15 minutes early and that it is imperative to arrive on time for appointment to keep from having the test rescheduled.  If you need to cancel or reschedule your appointment, please call the office within 24 hours of your appointment. . Patient verbalized understanding. Janifer Adie, CNMT, RT-N

## 2017-06-07 ENCOUNTER — Encounter: Payer: Self-pay | Admitting: Internal Medicine

## 2017-06-09 ENCOUNTER — Telehealth: Payer: Self-pay

## 2017-06-09 NOTE — Telephone Encounter (Signed)
Called pt, Monica Mora.   

## 2017-06-09 NOTE — Telephone Encounter (Signed)
-----   Message from Biagio Borg, MD sent at 06/07/2017  5:07 PM EDT ----- Letter to be sent  The test results show that your current treatment is OK, except the recent cxr and these breathing tests are suggestive of a kind possible scarring process to the lungs, the cause of which is not clear.  We definitely need to refer you to Pulmonary for further consideration.   Cielo Arias to please inform pt, I will do referral (and this is VERY unlikely to be cancer related in case she asks)

## 2017-06-10 ENCOUNTER — Ambulatory Visit (HOSPITAL_COMMUNITY): Payer: Medicare Other | Attending: Cardiovascular Disease

## 2017-06-10 DIAGNOSIS — R079 Chest pain, unspecified: Secondary | ICD-10-CM

## 2017-06-10 DIAGNOSIS — I10 Essential (primary) hypertension: Secondary | ICD-10-CM | POA: Diagnosis not present

## 2017-06-10 DIAGNOSIS — R0789 Other chest pain: Secondary | ICD-10-CM | POA: Diagnosis not present

## 2017-06-10 DIAGNOSIS — I4891 Unspecified atrial fibrillation: Secondary | ICD-10-CM | POA: Diagnosis not present

## 2017-06-10 DIAGNOSIS — R0609 Other forms of dyspnea: Secondary | ICD-10-CM | POA: Insufficient documentation

## 2017-06-10 LAB — MYOCARDIAL PERFUSION IMAGING
CHL CUP NUCLEAR SDS: 0
CHL CUP NUCLEAR SSS: 4
LHR: 0.44
LV dias vol: 73 mL (ref 46–106)
LVSYSVOL: 32 mL
NUC STRESS TID: 0.98
Peak HR: 79 {beats}/min
Rest HR: 56 {beats}/min
SRS: 4

## 2017-06-10 MED ORDER — TECHNETIUM TC 99M TETROFOSMIN IV KIT
10.8000 | PACK | Freq: Once | INTRAVENOUS | Status: AC | PRN
  Administered 2017-06-10: 10.8 via INTRAVENOUS
  Filled 2017-06-10: qty 11

## 2017-06-10 MED ORDER — TECHNETIUM TC 99M TETROFOSMIN IV KIT
30.8000 | PACK | Freq: Once | INTRAVENOUS | Status: AC | PRN
  Administered 2017-06-10: 30.8 via INTRAVENOUS
  Filled 2017-06-10: qty 31

## 2017-06-10 MED ORDER — REGADENOSON 0.4 MG/5ML IV SOLN
0.4000 mg | Freq: Once | INTRAVENOUS | Status: AC
  Administered 2017-06-10: 0.4 mg via INTRAVENOUS

## 2017-06-11 ENCOUNTER — Encounter: Payer: Self-pay | Admitting: Internal Medicine

## 2017-06-18 ENCOUNTER — Ambulatory Visit (INDEPENDENT_AMBULATORY_CARE_PROVIDER_SITE_OTHER): Payer: Medicare Other | Admitting: *Deleted

## 2017-06-18 DIAGNOSIS — Z7901 Long term (current) use of anticoagulants: Secondary | ICD-10-CM | POA: Diagnosis not present

## 2017-06-18 DIAGNOSIS — I4891 Unspecified atrial fibrillation: Secondary | ICD-10-CM

## 2017-06-18 LAB — POCT INR: INR: 2.7

## 2017-07-01 DIAGNOSIS — H401112 Primary open-angle glaucoma, right eye, moderate stage: Secondary | ICD-10-CM | POA: Diagnosis not present

## 2017-07-01 DIAGNOSIS — H401123 Primary open-angle glaucoma, left eye, severe stage: Secondary | ICD-10-CM | POA: Diagnosis not present

## 2017-07-01 DIAGNOSIS — I8312 Varicose veins of left lower extremity with inflammation: Secondary | ICD-10-CM | POA: Diagnosis not present

## 2017-07-03 ENCOUNTER — Telehealth: Payer: Self-pay

## 2017-07-03 ENCOUNTER — Ambulatory Visit (INDEPENDENT_AMBULATORY_CARE_PROVIDER_SITE_OTHER): Payer: Medicare Other | Admitting: Internal Medicine

## 2017-07-03 ENCOUNTER — Encounter: Payer: Self-pay | Admitting: Internal Medicine

## 2017-07-03 ENCOUNTER — Other Ambulatory Visit (INDEPENDENT_AMBULATORY_CARE_PROVIDER_SITE_OTHER): Payer: Medicare Other

## 2017-07-03 ENCOUNTER — Other Ambulatory Visit: Payer: Self-pay | Admitting: Internal Medicine

## 2017-07-03 VITALS — BP 146/88 | HR 79 | Temp 97.7°F | Ht 67.5 in | Wt 205.0 lb

## 2017-07-03 DIAGNOSIS — I4891 Unspecified atrial fibrillation: Secondary | ICD-10-CM | POA: Diagnosis not present

## 2017-07-03 DIAGNOSIS — E2839 Other primary ovarian failure: Secondary | ICD-10-CM | POA: Diagnosis not present

## 2017-07-03 DIAGNOSIS — E890 Postprocedural hypothyroidism: Secondary | ICD-10-CM | POA: Diagnosis not present

## 2017-07-03 DIAGNOSIS — Z0000 Encounter for general adult medical examination without abnormal findings: Secondary | ICD-10-CM

## 2017-07-03 DIAGNOSIS — Z7901 Long term (current) use of anticoagulants: Secondary | ICD-10-CM | POA: Diagnosis not present

## 2017-07-03 DIAGNOSIS — Z23 Encounter for immunization: Secondary | ICD-10-CM

## 2017-07-03 DIAGNOSIS — J309 Allergic rhinitis, unspecified: Secondary | ICD-10-CM | POA: Diagnosis not present

## 2017-07-03 LAB — HEPATIC FUNCTION PANEL
ALBUMIN: 4 g/dL (ref 3.5–5.2)
ALT: 13 U/L (ref 0–35)
AST: 15 U/L (ref 0–37)
Alkaline Phosphatase: 42 U/L (ref 39–117)
BILIRUBIN TOTAL: 0.7 mg/dL (ref 0.2–1.2)
Bilirubin, Direct: 0.1 mg/dL (ref 0.0–0.3)
Total Protein: 7.9 g/dL (ref 6.0–8.3)

## 2017-07-03 LAB — URINALYSIS, ROUTINE W REFLEX MICROSCOPIC
BILIRUBIN URINE: NEGATIVE
Hgb urine dipstick: NEGATIVE
LEUKOCYTES UA: NEGATIVE
NITRITE: NEGATIVE
PH: 5.5 (ref 5.0–8.0)
SPECIFIC GRAVITY, URINE: 1.02 (ref 1.000–1.030)
Total Protein, Urine: NEGATIVE
URINE GLUCOSE: NEGATIVE
Urobilinogen, UA: 0.2 (ref 0.0–1.0)

## 2017-07-03 LAB — CBC WITH DIFFERENTIAL/PLATELET
BASOS PCT: 1 % (ref 0.0–3.0)
Basophils Absolute: 0.1 10*3/uL (ref 0.0–0.1)
EOS PCT: 2 % (ref 0.0–5.0)
Eosinophils Absolute: 0.2 10*3/uL (ref 0.0–0.7)
HEMATOCRIT: 45.1 % (ref 36.0–46.0)
HEMOGLOBIN: 14.9 g/dL (ref 12.0–15.0)
LYMPHS PCT: 26.6 % (ref 12.0–46.0)
Lymphs Abs: 2.8 10*3/uL (ref 0.7–4.0)
MCHC: 33 g/dL (ref 30.0–36.0)
MCV: 98.1 fl (ref 78.0–100.0)
MONO ABS: 0.9 10*3/uL (ref 0.1–1.0)
MONOS PCT: 8.8 % (ref 3.0–12.0)
Neutro Abs: 6.4 10*3/uL (ref 1.4–7.7)
Neutrophils Relative %: 61.6 % (ref 43.0–77.0)
Platelets: 234 10*3/uL (ref 150.0–400.0)
RBC: 4.6 Mil/uL (ref 3.87–5.11)
RDW: 13.8 % (ref 11.5–15.5)
WBC: 10.4 10*3/uL (ref 4.0–10.5)

## 2017-07-03 LAB — LIPID PANEL
CHOL/HDL RATIO: 5
CHOLESTEROL: 226 mg/dL — AB (ref 0–200)
HDL: 47.1 mg/dL (ref >39.00)
LDL CALC: 140 mg/dL — AB (ref 0–99)
NonHDL: 178.68
TRIGLYCERIDES: 195 mg/dL — AB (ref 0.0–149.0)
VLDL: 39 mg/dL (ref 0.0–40.0)

## 2017-07-03 LAB — T4, FREE: FREE T4: 0.85 ng/dL (ref 0.60–1.60)

## 2017-07-03 LAB — BASIC METABOLIC PANEL
BUN: 12 mg/dL (ref 6–23)
CO2: 24 mEq/L (ref 19–32)
CREATININE: 0.78 mg/dL (ref 0.40–1.20)
Calcium: 9.9 mg/dL (ref 8.4–10.5)
Chloride: 101 mEq/L (ref 96–112)
GFR: 91.41 mL/min (ref >60.00)
GLUCOSE: 107 mg/dL — AB (ref 70–99)
Potassium: 3.4 mEq/L — ABNORMAL LOW (ref 3.5–5.1)
Sodium: 134 mEq/L — ABNORMAL LOW (ref 135–145)

## 2017-07-03 LAB — TSH: TSH: 0.94 u[IU]/mL (ref 0.35–4.50)

## 2017-07-03 MED ORDER — PRAVASTATIN SODIUM 40 MG PO TABS: 40.0000 mg | ORAL_TABLET | Freq: Every day | ORAL | 3 refills | Status: DC

## 2017-07-03 NOTE — Progress Notes (Signed)
Subjective:    Patient ID: Monica Mora, female    DOB: 28-Apr-1935, 82 y.o.   MRN: [id number]  HPI Here for wellness and f/u;  Overall doing ok;  Pt denies Chest pain, worsening SOB, DOE, wheezing, orthopnea, PND, worsening LE edema, palpitations, dizziness or syncope.  Pt denies neurological change such as new headache, facial or extremity weakness.  Pt denies polydipsia, polyuria, or low sugar symptoms. Pt states overall good compliance with treatment and medications, good tolerability, and has been trying to follow appropriate diet.  Pt denies worsening depressive symptoms, suicidal ideation or panic. No fever, night sweats, wt loss, loss of appetite, or other constitutional symptoms.  Pt states good ability with ADL's, has low fall risk, home safety reviewed and adequate, no other significant changes in hearing or vision, and only occasionally active with exercise. Has seen Dr Jones Skene - endovenous laser thermal ablation left leg yeterday, then for right leg soon. Denies hyper or hypo thyroid symptoms such as voice, skin or hair change.  Does have several wks ongoing nasal allergy symptoms with clearish congestion, itch and sneezing, without fever, pain, ST, cough, swelling or wheezing. Past Medical History:  Diagnosis Date  . Abnormal chest x-ray   . Atrial fibrillation ([place])   . CHF (congestive heart failure) ([place])   . Constrictive pericarditis    s/p pericardial window in 2002 in [place]  . Glaucoma   . H/O: hysterectomy 1992  . Heart murmur   . Hyperlipidemia   . Nontoxic multinodular goiter   . Other voice and resonance disorders   . TB (tuberculosis) 2002   positive  . Unspecified essential hypertension   . Unspecified hypothyroidism    Past Surgical History:  Procedure Laterality Date  . ABDOMINAL HYSTERECTOMY  1992  . PERICARDIAL WINDOW  2002  . Radioactive Iodine for hyperthyroidism/Multinodular goiter     s/p    reports that she quit smoking about 38 years  ago. She has never used smokeless tobacco. She reports that she does not drink alcohol or use drugs. family history includes Arthritis in her father and mother; Asthma in her brother; Cancer in her sister; Cervical cancer in her sister. No Known Allergies Current Outpatient Prescriptions on File Prior to Visit  Medication Sig Dispense Refill  . ALPHAGAN P 0.1 % SOLN     . b complex vitamins tablet Take 1 tablet by mouth daily.      . Cholecalciferol (VITAMIN D) 1000 UNITS capsule Take 1,000 Units by mouth daily.      . dorzolamide-timolol (COSOPT) 22.3-6.8 MG/ML ophthalmic solution Place into both eyes as directed.    . fish oil-omega-3 fatty acids 1000 MG capsule Take 1 capsule by mouth daily.     . hydrochlorothiazide (MICROZIDE) 12.5 MG capsule Take 1 capsule (12.5 mg total) by mouth daily. 90 capsule 3  . levothyroxine (SYNTHROID, LEVOTHROID) 75 MCG tablet TAKE 1 TABLET BY MOUTH EVERY DAY 90 tablet 2  . lisinopril (PRINIVIL,ZESTRIL) 40 MG tablet TAKE 1 TABLET BY MOUTH DAILY 90 tablet 3  . LUMIGAN 0.01 % SOLN     . Multiple Vitamins-Minerals (CENTRUM) tablet Take 1 tablet by mouth daily.      . TOPROL XL 50 MG 24 hr tablet TAKE 1 TABLET BY MOUTH DAILY. TAKE WITH OR IMMEDIATELY FOLLOWING A MEAL 90 tablet 3  . warfarin (COUMADIN) 5 MG tablet Take as directed by coumadin clinic 90 tablet 1   No current facility-administered medications on file prior to visit.  Review of Systems Constitutional: Negative for other unusual diaphoresis, sweats, appetite or weight changes HENT: Negative for other worsening hearing loss, ear pain, facial swelling, mouth sores or neck stiffness.   Eyes: Negative for other worsening pain, redness or other visual disturbance.  Respiratory: Negative for other stridor or swelling Cardiovascular: Negative for other palpitations or other chest pain  Gastrointestinal: Negative for worsening diarrhea or loose stools, blood in stool, distention or other  pain Genitourinary: Negative for hematuria, flank pain or other change in urine volume.  Musculoskeletal: Negative for myalgias or other joint swelling.  Skin: Negative for other color change, or other wound or worsening drainage.  Neurological: Negative for other syncope or numbness. Hematological: Negative for other adenopathy or swelling Psychiatric/Behavioral: Negative for hallucinations, other worsening agitation, SI, self-injury, or new decreased concentration All other system neg per pt    Objective:   Physical Exam BP (!) 146/88   Pulse 79   Temp 97.7 F (36.5 C) (Oral)   Ht 5' 7.5" (1.715 m)   Wt 205 lb (93 kg)   SpO2 97%   BMI 31.63 kg/m  VS noted,  Constitutional: Pt is oriented to person, place, and time. Appears well-developed and well-nourished, in no significant distress and comfortable Head: Normocephalic and atraumatic  Eyes: Conjunctivae and EOM are normal. Pupils are equal, round, and reactive to light Right Ear: External ear normal without discharge Left Ear: External ear normal without discharge Nose: Nose without discharge or deformity Mouth/Throat: Oropharynx is without other ulcerations and moist  Bilat tm's with mild erythema.  Max sinus areas tender.  Pharynx with mild erythema, no exudate Neck: Normal range of motion. Neck supple. No JVD present. No tracheal deviation present or significant neck LA or mass Cardiovascular: Normal rate, regular rhythm, normal heart sounds and intact distal pulses.   Pulmonary/Chest: WOB normal and breath sounds without rales or wheezing  Abdominal: Soft. Bowel sounds are normal. NT. No HSM  Musculoskeletal: Normal range of motion. Exhibits no edema Lymphadenopathy: Has no other cervical adenopathy.  Neurological: Pt is alert and oriented to person, place, and time. Pt has normal reflexes. No cranial nerve deficit. Motor grossly intact, Gait intact Skin: Skin is warm and dry. No rash noted or new ulcerations Psychiatric:   Has normal mood and affect. Behavior is normal without agitation\ No other exam findings Lab Results  Component Value Date   WBC 10.4 07/03/2017   HGB 14.9 07/03/2017   HCT 45.1 07/03/2017   PLT 234.0 07/03/2017   GLUCOSE 107 (H) 07/03/2017   CHOL 226 (H) 07/03/2017   TRIG 195.0 (H) 07/03/2017   HDL 47.10 07/03/2017   LDLDIRECT 149.7 07/17/2012   LDLCALC 140 (H) 07/03/2017   ALT 13 07/03/2017   AST 15 07/03/2017   NA 134 (L) 07/03/2017   K 3.4 (L) 07/03/2017   CL 101 07/03/2017   CREATININE 0.78 07/03/2017   BUN 12 07/03/2017   CO2 24 07/03/2017   TSH 0.94 07/03/2017   INR 2.7 06/18/2017       Assessment & Plan:

## 2017-07-03 NOTE — Telephone Encounter (Signed)
Pt has been informed and expressed understanding.  

## 2017-07-03 NOTE — Patient Instructions (Addendum)
You had the flu shot today  Please schedule the bone density test before leaving today at the scheduling desk (where you check out)  Please take zyrtec and nasacort (not flonase) for allergies.    Please continue all other medications as before, and refills have been done if requested.  Please have the pharmacy call with any other refills you may need.  Please continue your efforts at being more active, low cholesterol diet, and weight control.  You are otherwise up to date with prevention measures today.  Please keep your appointments with your specialists as you may have planned  Please go to the LAB in the Basement (turn left off the elevator) for the tests to be done today  You will be contacted by phone if any changes need to be made immediately.  Otherwise, you will receive a letter about your results with an explanation, but please check with MyChart first.  Please remember to sign up for MyChart if you have not done so, as this will be important to you in the future with finding out test results, communicating by private email, and scheduling acute appointments online when needed.  Please return in 1 year for your yearly visit, or sooner if needed, with Lab testing done 3-5 days before

## 2017-07-03 NOTE — Assessment & Plan Note (Signed)
Mild to mod, to change flonase to nasacort due to risk of nosebleed on coumadin, and yrtec asd,  to f/u any worsening symptoms or concerns

## 2017-07-03 NOTE — Assessment & Plan Note (Signed)

## 2017-07-03 NOTE — Telephone Encounter (Signed)
-----   Message from Biagio Borg, MD sent at 07/03/2017  4:16 PM EDT ----- Left message on MyChart, pt to cont same tx except  The test results show that your current treatment is OK., except the LDL cholesterol is still mildly elevated.  Please follow a lower cholesterol diet, and start Pravastatin 40 mg per day to help reduce your future heart disease and stroke risk.    Giancarlos Berendt to please inform pt, I will do rx

## 2017-07-03 NOTE — Assessment & Plan Note (Addendum)
stable overall by history and exam, recent data reviewed with pt, and pt to continue medical treatment as before,  to f/u any worsening symptoms or concerns, for TFT's today and endo f/u as planned Lab Results  Component Value Date   TSH 0.94 07/03/2017

## 2017-07-08 DIAGNOSIS — I8312 Varicose veins of left lower extremity with inflammation: Secondary | ICD-10-CM | POA: Diagnosis not present

## 2017-07-09 ENCOUNTER — Encounter: Payer: Self-pay | Admitting: Internal Medicine

## 2017-07-09 ENCOUNTER — Ambulatory Visit (INDEPENDENT_AMBULATORY_CARE_PROVIDER_SITE_OTHER): Payer: Medicare Other | Admitting: Internal Medicine

## 2017-07-09 VITALS — Ht 67.5 in

## 2017-07-09 DIAGNOSIS — E890 Postprocedural hypothyroidism: Secondary | ICD-10-CM

## 2017-07-09 DIAGNOSIS — J181 Lobar pneumonia, unspecified organism: Secondary | ICD-10-CM | POA: Diagnosis not present

## 2017-07-09 DIAGNOSIS — E785 Hyperlipidemia, unspecified: Secondary | ICD-10-CM | POA: Diagnosis not present

## 2017-07-09 DIAGNOSIS — I10 Essential (primary) hypertension: Secondary | ICD-10-CM | POA: Diagnosis not present

## 2017-07-09 DIAGNOSIS — J189 Pneumonia, unspecified organism: Secondary | ICD-10-CM | POA: Insufficient documentation

## 2017-07-09 NOTE — Progress Notes (Signed)
Subjective:    Patient ID: Monica Mora, female    DOB: [birth year], 82 y.o.   MRN: [id number]  HPI  Pt here to f/u recent levaquin tx for CAP; overall much improved,  Pt denies fever, wt loss, night sweats, loss of appetite, or other constitutional symptoms Pt denies chest pain, increased sob or doe, wheezing, orthopnea, PND, increased LE swelling, palpitations, dizziness or syncope.  Denies hyper or hypo thyroid symptoms such as voice, skin or hair change.  Also plans to start the statin later today, asks for f/u lab soon. 06/10/17 stress test low risk study Past Medical History:  Diagnosis Date  . Abnormal chest x-ray   . Atrial fibrillation ([place])   . CHF (congestive heart failure) ([place])   . Constrictive pericarditis    s/p pericardial window in 2002 in [place]  . Glaucoma   . H/O: hysterectomy 1992  . Heart murmur   . Hyperlipidemia   . Nontoxic multinodular goiter   . Other voice and resonance disorders   . TB (tuberculosis) 2002   positive  . Unspecified essential hypertension   . Unspecified hypothyroidism    Past Surgical History:  Procedure Laterality Date  . ABDOMINAL HYSTERECTOMY  1992  . PERICARDIAL WINDOW  2002  . Radioactive Iodine for hyperthyroidism/Multinodular goiter     s/p    reports that she quit smoking about 38 years ago. She has never used smokeless tobacco. She reports that she does not drink alcohol or use drugs. family history includes Arthritis in her father and mother; Asthma in her brother; Cancer in her sister; Cervical cancer in her sister. No Known Allergies Current Outpatient Prescriptions on File Prior to Visit  Medication Sig Dispense Refill  . ALPHAGAN P 0.1 % SOLN     . b complex vitamins tablet Take 1 tablet by mouth daily.      . Cholecalciferol (VITAMIN D) 1000 UNITS capsule Take 1,000 Units by mouth daily.      . dorzolamide-timolol (COSOPT) 22.3-6.8 MG/ML ophthalmic solution Place into both eyes as directed.    . fish  oil-omega-3 fatty acids 1000 MG capsule Take 1 capsule by mouth daily.     . hydrochlorothiazide (MICROZIDE) 12.5 MG capsule Take 1 capsule (12.5 mg total) by mouth daily. 90 capsule 3  . levothyroxine (SYNTHROID, LEVOTHROID) 75 MCG tablet TAKE 1 TABLET BY MOUTH EVERY DAY 90 tablet 2  . lisinopril (PRINIVIL,ZESTRIL) 40 MG tablet TAKE 1 TABLET BY MOUTH DAILY 90 tablet 3  . LUMIGAN 0.01 % SOLN     . Multiple Vitamins-Minerals (CENTRUM) tablet Take 1 tablet by mouth daily.      . pravastatin (PRAVACHOL) 40 MG tablet Take 1 tablet (40 mg total) by mouth daily. 90 tablet 3  . TOPROL XL 50 MG 24 hr tablet TAKE 1 TABLET BY MOUTH DAILY. TAKE WITH OR IMMEDIATELY FOLLOWING A MEAL 90 tablet 3  . warfarin (COUMADIN) 5 MG tablet Take as directed by coumadin clinic 90 tablet 1   No current facility-administered medications on file prior to visit.    Review of Systems  Constitutional: Negative for other unusual diaphoresis or sweats HENT: Negative for ear discharge or swelling Eyes: Negative for other worsening visual disturbances Respiratory: Negative for stridor or other swelling  Gastrointestinal: Negative for worsening distension or other blood Genitourinary: Negative for retention or other urinary change Musculoskeletal: Negative for other MSK pain or swelling Skin: Negative for color change or other new lesions Neurological: Negative for worsening tremors and  other numbness  Psychiatric/Behavioral: Negative for worsening agitation or other fatigue All other system neg per pt    Objective:   Physical Exam Ht 5' 7.5" (1.715 m)  VS noted,  Constitutional: Pt appears in NAD HENT: Head: NCAT.  Right Ear: External ear normal.  Left Ear: External ear normal.  Eyes: . Pupils are equal, round, and reactive to light. Conjunctivae and EOM are normal Nose: without d/c or deformity Neck: Neck supple. Gross normal ROM Cardiovascular: Normal rate and regular rhythm.   Pulmonary/Chest: Effort normal and  breath sounds without rales or wheezing.  Neurological: Pt is alert. At baseline orientation, motor grossly intact Skin: Skin is warm. No rashes, other new lesions, no LE edema Psychiatric: Pt behavior is normal without agitation  No other exam findings Lab Results  Component Value Date   WBC 10.4 07/03/2017   HGB 14.9 07/03/2017   HCT 45.1 07/03/2017   PLT 234.0 07/03/2017   GLUCOSE 107 (H) 07/03/2017   CHOL 226 (H) 07/03/2017   TRIG 195.0 (H) 07/03/2017   HDL 47.10 07/03/2017   LDLDIRECT 149.7 07/17/2012   LDLCALC 140 (H) 07/03/2017   ALT 13 07/03/2017   AST 15 07/03/2017   NA 134 (L) 07/03/2017   K 3.4 (L) 07/03/2017   CL 101 07/03/2017   CREATININE 0.78 07/03/2017   BUN 12 07/03/2017   CO2 24 07/03/2017   TSH 0.94 07/03/2017   INR 2.7 06/18/2017   CHEST  2 VIEW 05/22/2017 - summary IMPRESSION: Chronic bronchitic-smoking related changes. Right lower lobe atelectasis or pneumonia. Followup PA and lateral chest X-ray is recommended in 3-4 weeks following trial of antibiotic therapy to ensure resolution and exclude underlying malignancy.  Top-normal cardiac size without pulmonary vascular congestion similar to that seen previously.  Thoracic aortic atherosclerosis.      Assessment & Plan:

## 2017-07-09 NOTE — Assessment & Plan Note (Signed)
stable overall by history and exam, recent data reviewed with pt, and pt to continue medical treatment as before,  to f/u any worsening symptoms or concerns BP Readings from Last 3 Encounters:  07/03/17 (!) 146/88  05/22/17 128/76  03/24/17 128/62

## 2017-07-09 NOTE — Assessment & Plan Note (Signed)
Mild uncontrolled, to start statin asd, f/u lab in 4 wks

## 2017-07-09 NOTE — Assessment & Plan Note (Signed)
Lab Results  Component Value Date   TSH 0.94 07/03/2017  stable overall by history and exam, recent data reviewed with pt, and pt to continue medical treatment as before,  to f/u any worsening symptoms or concerns

## 2017-07-09 NOTE — Assessment & Plan Note (Signed)
Resolved, no further antibx needed at this time

## 2017-07-09 NOTE — Patient Instructions (Signed)
Please continue all other medications as before, and refills have been done if requested.  Please have the pharmacy call with any other refills you may need.  Please continue your efforts at being more active, low cholesterol diet, and weight control  Please keep your appointments with your specialists as you may have planned  Please return to the lab in 1 month for repeat cholesterol and liver tests

## 2017-07-16 ENCOUNTER — Ambulatory Visit (INDEPENDENT_AMBULATORY_CARE_PROVIDER_SITE_OTHER)
Admission: RE | Admit: 2017-07-16 | Discharge: 2017-07-16 | Disposition: A | Discharge status: Home / Self Care | Payer: Medicare Other | Source: Ambulatory Visit | Attending: Internal Medicine | Admitting: Internal Medicine

## 2017-07-16 DIAGNOSIS — E2839 Other primary ovarian failure: Secondary | ICD-10-CM | POA: Diagnosis not present

## 2017-07-17 ENCOUNTER — Encounter: Payer: Self-pay | Admitting: Internal Medicine

## 2017-07-29 ENCOUNTER — Ambulatory Visit (INDEPENDENT_AMBULATORY_CARE_PROVIDER_SITE_OTHER): Payer: Medicare Other | Admitting: Endocrinology

## 2017-07-29 ENCOUNTER — Encounter: Payer: Self-pay | Admitting: Endocrinology

## 2017-07-29 VITALS — BP 124/70 | HR 64 | Wt 208.0 lb

## 2017-07-29 DIAGNOSIS — E890 Postprocedural hypothyroidism: Secondary | ICD-10-CM | POA: Diagnosis not present

## 2017-07-29 NOTE — Progress Notes (Signed)
Subjective:    Patient ID: Monica Mora, female    DOB: Nov 20, 1934, 82 y.o.   MRN: [id number]  HPI Pt returns for f/u of post-RAI hypothyroidism (she had RAI rx for hyperthyroidism, due to a goiter with just one small nodule, in 2008; she has been on synthroid since soon thereafter; she has been on the same dosage (75 mcg/day) for several years; she sees Dr Harrington Challenger for AF; last [place] in 2014 was unchanged).  pt states she feels well in general.  She takes synthroid as rx'ed.  Past Medical History:  Diagnosis Date  . Abnormal chest x-ray   . Atrial fibrillation ([place])   . CHF (congestive heart failure) ([place])   . Constrictive pericarditis    s/p pericardial window in 2002 in [place]  . Glaucoma   . H/O: hysterectomy 1992  . Heart murmur   . Hyperlipidemia   . Nontoxic multinodular goiter   . Other voice and resonance disorders   . TB (tuberculosis) 2002   positive  . Unspecified essential hypertension   . Unspecified hypothyroidism     Past Surgical History:  Procedure Laterality Date  . ABDOMINAL HYSTERECTOMY  1992  . PERICARDIAL WINDOW  2002  . Radioactive Iodine for hyperthyroidism/Multinodular goiter     s/p    Social History   Social History  . Marital status: Single    Spouse name: N/A  . Number of children: 0  . Years of education: N/A   Occupational History  . Retired     Marketing executive work  .  Retired   Social History Main Topics  . Smoking status: Former Smoker    Quit date: 10/21/1978  . Smokeless tobacco: Never Used  . Alcohol use No  . Drug use: No  . Sexual activity: Not Currently   Other Topics Concern  . Not on file   Social History Narrative   Daily Caffeine Use: 1 daily    Current Outpatient Prescriptions on File Prior to Visit  Medication Sig Dispense Refill  . ALPHAGAN P 0.1 % SOLN     . b complex vitamins tablet Take 1 tablet by mouth daily.      . Cholecalciferol (VITAMIN D) 1000 UNITS capsule Take 1,000 Units by mouth daily.      .  dorzolamide-timolol (COSOPT) 22.3-6.8 MG/ML ophthalmic solution Place into both eyes as directed.    . fish oil-omega-3 fatty acids 1000 MG capsule Take 1 capsule by mouth daily.     . hydrochlorothiazide (MICROZIDE) 12.5 MG capsule Take 1 capsule (12.5 mg total) by mouth daily. 90 capsule 3  . levothyroxine (SYNTHROID, LEVOTHROID) 75 MCG tablet TAKE 1 TABLET BY MOUTH EVERY DAY 90 tablet 2  . lisinopril (PRINIVIL,ZESTRIL) 40 MG tablet TAKE 1 TABLET BY MOUTH DAILY 90 tablet 3  . LUMIGAN 0.01 % SOLN     . Multiple Vitamins-Minerals (CENTRUM) tablet Take 1 tablet by mouth daily.      . pravastatin (PRAVACHOL) 40 MG tablet Take 1 tablet (40 mg total) by mouth daily. 90 tablet 3  . TOPROL XL 50 MG 24 hr tablet TAKE 1 TABLET BY MOUTH DAILY. TAKE WITH OR IMMEDIATELY FOLLOWING A MEAL 90 tablet 3  . warfarin (COUMADIN) 5 MG tablet Take as directed by coumadin clinic 90 tablet 1   No current facility-administered medications on file prior to visit.     No Known Allergies  Family History  Problem Relation Age of Onset  . Asthma Brother   . Arthritis  Mother   . Arthritis Father   . Cervical cancer Sister   . Cancer Sister        Cervical Cancer    BP 124/70   Pulse 64   Wt 208 lb (94.3 kg)   SpO2 94%   BMI 32.10 kg/m    Review of Systems Denies dry skin.      Objective:   Physical Exam VITAL SIGNS:  See vs page GENERAL: no distress Neck: thyroid is slightly and diffusely enlarged.  No palpable nodule.   Lab Results  Component Value Date   TSH 0.94 07/03/2017      Assessment & Plan:  Hypothyroidism: well-replaced.  Multinodular goiter: mild.    Patient Instructions  Please continue the same medication. I would be happy to see you back here as needed.

## 2017-07-29 NOTE — Patient Instructions (Addendum)
Please continue the same medication. I would be happy to see you back here as needed.

## 2017-07-30 ENCOUNTER — Ambulatory Visit: Payer: Medicare Other

## 2017-07-30 DIAGNOSIS — Z7901 Long term (current) use of anticoagulants: Secondary | ICD-10-CM

## 2017-07-30 DIAGNOSIS — I4891 Unspecified atrial fibrillation: Secondary | ICD-10-CM

## 2017-07-30 LAB — POCT INR: INR: 2.6

## 2017-08-05 DIAGNOSIS — I8312 Varicose veins of left lower extremity with inflammation: Secondary | ICD-10-CM | POA: Diagnosis not present

## 2017-08-07 DIAGNOSIS — I8312 Varicose veins of left lower extremity with inflammation: Secondary | ICD-10-CM | POA: Diagnosis not present

## 2017-08-08 ENCOUNTER — Other Ambulatory Visit: Payer: Self-pay | Admitting: Internal Medicine

## 2017-08-08 ENCOUNTER — Encounter: Payer: Self-pay | Admitting: Internal Medicine

## 2017-08-08 ENCOUNTER — Other Ambulatory Visit (INDEPENDENT_AMBULATORY_CARE_PROVIDER_SITE_OTHER): Payer: Medicare Other

## 2017-08-08 DIAGNOSIS — E785 Hyperlipidemia, unspecified: Secondary | ICD-10-CM | POA: Diagnosis not present

## 2017-08-08 LAB — HEPATIC FUNCTION PANEL
ALBUMIN: 3.8 g/dL (ref 3.5–5.2)
ALK PHOS: 40 U/L (ref 39–117)
ALT: 15 U/L (ref 0–35)
AST: 15 U/L (ref 0–37)
BILIRUBIN TOTAL: 0.7 mg/dL (ref 0.2–1.2)
Bilirubin, Direct: 0.1 mg/dL (ref 0.0–0.3)
Total Protein: 7.9 g/dL (ref 6.0–8.3)

## 2017-08-08 LAB — LIPID PANEL
CHOL/HDL RATIO: 5
Cholesterol: 191 mg/dL (ref 0–200)
HDL: 35.5 mg/dL — AB (ref >39.00)
LDL Cholesterol: 123 mg/dL — ABNORMAL HIGH (ref 0–99)
NONHDL: 155.16
Triglycerides: 159 mg/dL — ABNORMAL HIGH (ref 0.0–149.0)
VLDL: 31.8 mg/dL (ref 0.0–40.0)

## 2017-08-08 MED ORDER — ROSUVASTATIN CALCIUM 10 MG PO TABS: 10.0000 mg | ORAL_TABLET | Freq: Every day | ORAL | 3 refills | Status: DC

## 2017-08-11 ENCOUNTER — Telehealth: Payer: Self-pay

## 2017-08-11 NOTE — Telephone Encounter (Signed)
-----   Message from Biagio Borg, MD sent at 08/08/2017  4:23 PM EDT ----- Letter sent, cont same tx except  The test results show that your current treatment is OK, except the LDL cholesterol is still too high.  It appears we need to change the pravastatin to a more effective one called crestor 10 mg daily.   I will send a new prescription, and you should hear from the office as well.  Shirron to please inform pt, I will do rx

## 2017-08-11 NOTE — Telephone Encounter (Signed)
Called pt, LVM.   

## 2017-08-13 DIAGNOSIS — H401112 Primary open-angle glaucoma, right eye, moderate stage: Secondary | ICD-10-CM | POA: Diagnosis not present

## 2017-08-13 DIAGNOSIS — H401123 Primary open-angle glaucoma, left eye, severe stage: Secondary | ICD-10-CM | POA: Diagnosis not present

## 2017-08-20 DIAGNOSIS — I8312 Varicose veins of left lower extremity with inflammation: Secondary | ICD-10-CM | POA: Diagnosis not present

## 2017-09-10 ENCOUNTER — Ambulatory Visit (INDEPENDENT_AMBULATORY_CARE_PROVIDER_SITE_OTHER): Payer: Medicare Other | Admitting: Pharmacist

## 2017-09-10 DIAGNOSIS — Z7901 Long term (current) use of anticoagulants: Secondary | ICD-10-CM

## 2017-09-10 DIAGNOSIS — I4891 Unspecified atrial fibrillation: Secondary | ICD-10-CM | POA: Diagnosis not present

## 2017-09-10 LAB — POCT INR: INR: 2.3

## 2017-09-10 NOTE — Patient Instructions (Signed)
Continue on same dosage 1 tablet everyday except 1/2 tablet on Mondays, Wednesdays, and Fridays.  Recheck INR in 6 weeks.  [phone] [facility]

## 2017-09-16 DIAGNOSIS — I8312 Varicose veins of left lower extremity with inflammation: Secondary | ICD-10-CM | POA: Diagnosis not present

## 2017-09-22 ENCOUNTER — Other Ambulatory Visit: Payer: Self-pay | Admitting: Endocrinology

## 2017-09-22 ENCOUNTER — Other Ambulatory Visit: Payer: Self-pay | Admitting: Internal Medicine

## 2017-09-22 ENCOUNTER — Other Ambulatory Visit: Payer: Self-pay

## 2017-09-22 MED ORDER — LEVOTHYROXINE SODIUM 75 MCG PO TABS: 75.0000 ug | ORAL_TABLET | Freq: Every day | ORAL | 2 refills | Status: DC

## 2017-10-08 DIAGNOSIS — I8312 Varicose veins of left lower extremity with inflammation: Secondary | ICD-10-CM | POA: Diagnosis not present

## 2017-10-10 ENCOUNTER — Other Ambulatory Visit: Payer: Self-pay | Admitting: Internal Medicine

## 2017-10-17 ENCOUNTER — Ambulatory Visit: Payer: Medicare Other | Admitting: Internal Medicine

## 2017-10-17 ENCOUNTER — Ambulatory Visit (INDEPENDENT_AMBULATORY_CARE_PROVIDER_SITE_OTHER)
Admission: RE | Admit: 2017-10-17 | Discharge: 2017-10-17 | Disposition: A | Discharge status: Home / Self Care | Payer: Medicare Other | Source: Ambulatory Visit | Attending: Internal Medicine | Admitting: Internal Medicine

## 2017-10-17 ENCOUNTER — Encounter: Payer: Self-pay | Admitting: Internal Medicine

## 2017-10-17 VITALS — BP 112/68 | HR 68 | Temp 97.6°F | Ht 67.5 in | Wt 208.0 lb

## 2017-10-17 DIAGNOSIS — R0600 Dyspnea, unspecified: Secondary | ICD-10-CM | POA: Diagnosis not present

## 2017-10-17 DIAGNOSIS — I10 Essential (primary) hypertension: Secondary | ICD-10-CM | POA: Diagnosis not present

## 2017-10-17 DIAGNOSIS — R942 Abnormal results of pulmonary function studies: Secondary | ICD-10-CM | POA: Diagnosis not present

## 2017-10-17 DIAGNOSIS — R918 Other nonspecific abnormal finding of lung field: Secondary | ICD-10-CM | POA: Diagnosis not present

## 2017-10-17 MED ORDER — ALBUTEROL SULFATE HFA 108 (90 BASE) MCG/ACT IN AERS: 2.0000 | INHALATION_SPRAY | Freq: Four times a day (QID) | RESPIRATORY_TRACT | 11 refills | Status: DC | PRN

## 2017-10-17 NOTE — Progress Notes (Signed)
Subjective:    Patient ID: Monica Mora, female    DOB: Jun 17, 1935, 82 y.o.   MRN: [id number]  HPI Here to f/u; overall doing ok,  Pt denies chest pain,  wheezing, orthopnea, PND, increased LE swelling, palpitations, dizziness or syncope, but c/o 3 mo ongoing dyspnea worse with exertion but without fever, cough but some vague sort of midline lower sternal fullness not pleuritic or overtly related to diaphoresis, n/v, position, or exercise .  Pt denies new neurological symptoms such as new headache, or facial or extremity weakness or numbness.  Pt denies polydipsia, polyuria, or low sugar episode.  Pt states overall good compliance with meds, mostly trying to follow appropriate diet, with wt overall stable,  Wt Readings from Last 3 Encounters:  10/17/17 208 lb (94.3 kg)  07/29/17 208 lb (94.3 kg)  07/03/17 205 lb (93 kg)  Did have abnormal PFTs recently I think suggestive of possible interstitial lung dz.   Past Medical History:  Diagnosis Date  . Abnormal chest x-ray   . Atrial fibrillation ([place])   . CHF (congestive heart failure) ([place])   . Constrictive pericarditis    s/p pericardial window in 2002 in [place]  . Glaucoma   . H/O: hysterectomy 1992  . Heart murmur   . Hyperlipidemia   . Nontoxic multinodular goiter   . Other voice and resonance disorders   . TB (tuberculosis) 2002   positive  . Unspecified essential hypertension   . Unspecified hypothyroidism    Past Surgical History:  Procedure Laterality Date  . ABDOMINAL HYSTERECTOMY  1992  . PERICARDIAL WINDOW  2002  . Radioactive Iodine for hyperthyroidism/Multinodular goiter     s/p    reports that she quit smoking about 39 years ago. she has never used smokeless tobacco. She reports that she does not drink alcohol or use drugs. family history includes Arthritis in her father and mother; Asthma in her brother; Cancer in her sister; Cervical cancer in her sister. No Known Allergies Current Outpatient Medications  on File Prior to Visit  Medication Sig Dispense Refill  . ALPHAGAN P 0.1 % SOLN     . b complex vitamins tablet Take 1 tablet by mouth daily.      . Cholecalciferol (VITAMIN D) 1000 UNITS capsule Take 1,000 Units by mouth daily.      . dorzolamide-timolol (COSOPT) 22.3-6.8 MG/ML ophthalmic solution Place into both eyes as directed.    . fish oil-omega-3 fatty acids 1000 MG capsule Take 1 capsule by mouth daily.     . hydrochlorothiazide (MICROZIDE) 12.5 MG capsule TAKE ONE CAPSULE BY MOUTH EVERY DAY 90 capsule 3  . levothyroxine (SYNTHROID, LEVOTHROID) 75 MCG tablet Take 1 tablet (75 mcg total) by mouth daily. 90 tablet 2  . lisinopril (PRINIVIL,ZESTRIL) 40 MG tablet TAKE 1 TABLET BY MOUTH DAILY 90 tablet 2  . LUMIGAN 0.01 % SOLN     . Multiple Vitamins-Minerals (CENTRUM) tablet Take 1 tablet by mouth daily.      . rosuvastatin (CRESTOR) 10 MG tablet Take 1 tablet (10 mg total) by mouth daily. 90 tablet 3  . TOPROL XL 50 MG 24 hr tablet TAKE 1 TABLET BY MOUTH DAILY. TAKE WITH OR IMMEDIATELY FOLLOWING A MEAL 90 tablet 3  . warfarin (COUMADIN) 5 MG tablet Take as directed by coumadin clinic 90 tablet 1   No current facility-administered medications on file prior to visit.    Review of Systems  Constitutional: Negative for other unusual diaphoresis or sweats  HENT: Negative for ear discharge or swelling Eyes: Negative for other worsening visual disturbances Respiratory: Negative for stridor or other swelling  Gastrointestinal: Negative for worsening distension or other blood Genitourinary: Negative for retention or other urinary change Musculoskeletal: Negative for other MSK pain or swelling Skin: Negative for color change or other new lesions Neurological: Negative for worsening tremors and other numbness  Psychiatric/Behavioral: Negative for worsening agitation or other fatigue All other system neg per pt    Objective:   Physical Exam BP 112/68   Pulse 68   Temp 97.6 F (36.4 C)  (Oral)   Ht 5' 7.5" (1.715 m)   Wt 208 lb (94.3 kg)   BMI 32.10 kg/m  amb o2 sat 97% resting, 94% with ambulation to 100 ft VS noted,  Constitutional: Pt appears in NAD HENT: Head: NCAT.  Right Ear: External ear normal.  Left Ear: External ear normal.  Eyes: . Pupils are equal, round, and reactive to light. Conjunctivae and EOM are normal Nose: without d/c or deformity Neck: Neck supple. Gross normal ROM Cardiovascular: Normal rate and regular rhythm.   Pulmonary/Chest: Effort normal and breath sounds decreased bilat bases without rales or wheezing.  Abd:  Soft, NT, ND, + BS, no organomegaly Neurological: Pt is alert. At baseline orientation, motor grossly intact Skin: Skin is warm. No rashes, other new lesions, no LE edema Psychiatric: Pt behavior is normal without agitation  No other exam findings    Assessment & Plan:

## 2017-10-17 NOTE — Patient Instructions (Addendum)
Your oxygen is OK today  Please take all new medication as prescribed - the new inhaler to help with opening up the lungs to breath better  Please continue all other medications as before, and refills have been done if requested.  Please have the pharmacy call with any other refills you may need.  Please continue your efforts at being more active, low cholesterol diet, and weight control.  You are otherwise up to date with prevention measures today.  Please keep your appointments with your specialists as you may have planned  You will be contacted regarding the referral for: Pulmonary  Please go to the XRAY Department in the Basement (go straight as you get off the elevator) for the x-ray testing  You will be contacted by phone if any changes need to be made immediately.  Otherwise, you will receive a letter about your results with an explanation, but please check with MyChart first.  Please remember to sign up for MyChart if you have not done so, as this will be important to you in the future with finding out test results, communicating by private email, and scheduling acute appointments online when needed.

## 2017-10-18 ENCOUNTER — Other Ambulatory Visit: Payer: Self-pay | Admitting: Internal Medicine

## 2017-10-18 ENCOUNTER — Encounter: Payer: Self-pay | Admitting: Internal Medicine

## 2017-10-18 MED ORDER — LEVOFLOXACIN 500 MG PO TABS: 500.0000 mg | ORAL_TABLET | Freq: Every day | ORAL | 0 refills | Status: AC

## 2017-10-18 NOTE — Assessment & Plan Note (Signed)
I think suggestive of interstitial lung dz, maybe related early with dyspnea, for f/u cxr today, and refer pulmonary

## 2017-10-18 NOTE — Assessment & Plan Note (Signed)
stable overall by history and exam, recent data reviewed with pt, and pt to continue medical treatment as before,  to f/u any worsening symptoms or concerns  

## 2017-10-18 NOTE — Assessment & Plan Note (Signed)
Etiology unclear, afeb, for cxr f/u today

## 2017-10-20 ENCOUNTER — Other Ambulatory Visit: Payer: Self-pay | Admitting: Internal Medicine

## 2017-10-20 DIAGNOSIS — J189 Pneumonia, unspecified organism: Secondary | ICD-10-CM

## 2017-10-22 ENCOUNTER — Telehealth: Payer: Self-pay

## 2017-10-22 ENCOUNTER — Telehealth: Payer: Self-pay | Admitting: Internal Medicine

## 2017-10-22 NOTE — Telephone Encounter (Signed)
Copied from [place]. Topic: Quick Communication - See Telephone Encounter >> Oct 22, 2017  9:08 AM Ahmed Prima L wrote: CRM for notification. See Telephone encounter for:   10/22/17.   Pt said she missed a call on Monday. She said she was sure it was about her xray she had on Friday. Please call her back & give her the results of that. Call back [phone]

## 2017-10-22 NOTE — Telephone Encounter (Signed)
Informed pt and she expressed understanding.

## 2017-10-22 NOTE — Telephone Encounter (Signed)
-----   Message from Biagio Borg, MD sent at 10/20/2017 11:57 AM EST ----- Please also ask pt to make follow up CXR in 3 wks - I will place order

## 2017-10-28 ENCOUNTER — Ambulatory Visit (INDEPENDENT_AMBULATORY_CARE_PROVIDER_SITE_OTHER): Payer: Medicare Other | Admitting: *Deleted

## 2017-10-28 DIAGNOSIS — I4891 Unspecified atrial fibrillation: Secondary | ICD-10-CM | POA: Diagnosis not present

## 2017-10-28 DIAGNOSIS — Z7901 Long term (current) use of anticoagulants: Secondary | ICD-10-CM

## 2017-10-28 LAB — POCT INR: INR: 3

## 2017-10-28 NOTE — Patient Instructions (Signed)
Description   Continue on same dosage 1 tablet everyday except 1/2 tablet on Mondays, Wednesdays, and Fridays.  Recheck INR in 6 weeks.  [phone] [facility]

## 2017-11-11 ENCOUNTER — Other Ambulatory Visit: Payer: Self-pay | Admitting: *Deleted

## 2017-11-11 MED ORDER — WARFARIN SODIUM 5 MG PO TABS: ORAL_TABLET | ORAL | 1 refills | Status: DC

## 2017-11-12 ENCOUNTER — Telehealth: Payer: Self-pay | Admitting: Internal Medicine

## 2017-11-12 NOTE — Telephone Encounter (Signed)
Called pt, LVM.   

## 2017-11-12 NOTE — Telephone Encounter (Signed)
Copied from [place]. Topic: Quick Communication - See Telephone Encounter >> Nov 12, 2017 10:03 AM Ivar Drape wrote: CRM for notification. See Telephone encounter for:  11/12/17. Patient states that the inhaler that she was given by Dr. Jenny Reichmann isn't working very well and she wants to know if she possibly needs an MRI.  She would like to talk to Dr. Gwynn Burly CMA about it.  Please call [phone]

## 2017-11-12 NOTE — Telephone Encounter (Signed)
I could not order an MRI as this was not felt needed at last visit  OK to consider ROV if she wishes to discuss further symptoms

## 2017-11-12 NOTE — Telephone Encounter (Signed)
Called pt, LVM with details below  

## 2017-11-12 NOTE — Telephone Encounter (Signed)
Patient stated that she is still having SOB, she has already completed the antibiotics last Weds and is still using the inhaler, she stated that she has not had the xray done yet but she would like to inquire about having a MRI done of the lower abdominal area. Please advise.

## 2017-11-13 NOTE — Telephone Encounter (Signed)
Pt has some questions regarding Shirron's message. Pt would like her to call her back when she gets a chance.

## 2017-11-13 NOTE — Telephone Encounter (Signed)
Pt stated that she has made an appt for [facility] to come back in to discuss symptoms. She would like to have a discussion with JJ about what needs to be done now.

## 2017-11-19 ENCOUNTER — Encounter: Payer: Self-pay | Admitting: Internal Medicine

## 2017-11-19 ENCOUNTER — Ambulatory Visit (INDEPENDENT_AMBULATORY_CARE_PROVIDER_SITE_OTHER)
Admission: RE | Admit: 2017-11-19 | Discharge: 2017-11-19 | Disposition: A | Discharge status: Home / Self Care | Payer: Medicare Other | Source: Ambulatory Visit | Attending: Internal Medicine | Admitting: Internal Medicine

## 2017-11-19 ENCOUNTER — Ambulatory Visit (INDEPENDENT_AMBULATORY_CARE_PROVIDER_SITE_OTHER): Payer: Medicare Other | Admitting: Internal Medicine

## 2017-11-19 VITALS — BP 130/86 | HR 76 | Temp 97.6°F | Ht 67.5 in | Wt 210.0 lb

## 2017-11-19 DIAGNOSIS — R942 Abnormal results of pulmonary function studies: Secondary | ICD-10-CM

## 2017-11-19 DIAGNOSIS — R0600 Dyspnea, unspecified: Secondary | ICD-10-CM

## 2017-11-19 DIAGNOSIS — R0689 Other abnormalities of breathing: Secondary | ICD-10-CM

## 2017-11-19 DIAGNOSIS — R0602 Shortness of breath: Secondary | ICD-10-CM | POA: Diagnosis not present

## 2017-11-19 NOTE — Progress Notes (Signed)
Subjective:    Patient ID: Monica Mora, female    DOB: 11-16-1934, 83 y.o.   MRN: [id number]  HPI  Here to f/u, c/o persistent dyspnea now chronic for several months, only some improved with albuterol inhaler use, wondering about next step.  Had cxr about 4 wks ago with RLL infiltrate tx as pna, with f/u cxr planned for today to f/u RLL infiltrate resolution.  Pt denies chest pain, wheezing, orthopnea, PND, increased LE swelling, palpitations, dizziness or syncope. Did have PFTs recent suggestive of ILD, pulm referral order placed dec 28 but pt states not contacted regarding this.   Pt denies fever, wt loss, night sweats, loss of appetite, or other constitutional symptoms   Pt denies polydipsia, polyuria Past Medical History:  Diagnosis Date  . Abnormal chest x-ray   . Atrial fibrillation ([place])   . CHF (congestive heart failure) ([place])   . Constrictive pericarditis    s/p pericardial window in 2002 in [place]  . Glaucoma   . H/O: hysterectomy 1992  . Heart murmur   . Hyperlipidemia   . Nontoxic multinodular goiter   . Other voice and resonance disorders   . TB (tuberculosis) 2002   positive  . Unspecified essential hypertension   . Unspecified hypothyroidism    Past Surgical History:  Procedure Laterality Date  . ABDOMINAL HYSTERECTOMY  1992  . PERICARDIAL WINDOW  2002  . Radioactive Iodine for hyperthyroidism/Multinodular goiter     s/p    reports that she quit smoking about 39 years ago. she has never used smokeless tobacco. She reports that she does not drink alcohol or use drugs. family history includes Arthritis in her father and mother; Asthma in her brother; Cancer in her sister; Cervical cancer in her sister. No Known Allergies Current Outpatient Medications on File Prior to Visit  Medication Sig Dispense Refill  . albuterol (PROVENTIL HFA;VENTOLIN HFA) 108 (90 Base) MCG/ACT inhaler Inhale 2 puffs into the lungs every 6 (six) hours as needed for wheezing or  shortness of breath. 1 Inhaler 11  . ALPHAGAN P 0.1 % SOLN     . b complex vitamins tablet Take 1 tablet by mouth daily.      . Cholecalciferol (VITAMIN D) 1000 UNITS capsule Take 1,000 Units by mouth daily.      . dorzolamide-timolol (COSOPT) 22.3-6.8 MG/ML ophthalmic solution Place into both eyes as directed.    . fish oil-omega-3 fatty acids 1000 MG capsule Take 1 capsule by mouth daily.     . hydrochlorothiazide (MICROZIDE) 12.5 MG capsule TAKE ONE CAPSULE BY MOUTH EVERY DAY 90 capsule 3  . levothyroxine (SYNTHROID, LEVOTHROID) 75 MCG tablet Take 1 tablet (75 mcg total) by mouth daily. 90 tablet 2  . lisinopril (PRINIVIL,ZESTRIL) 40 MG tablet TAKE 1 TABLET BY MOUTH DAILY 90 tablet 2  . LUMIGAN 0.01 % SOLN     . Multiple Vitamins-Minerals (CENTRUM) tablet Take 1 tablet by mouth daily.      . rosuvastatin (CRESTOR) 10 MG tablet Take 1 tablet (10 mg total) by mouth daily. 90 tablet 3  . TOPROL XL 50 MG 24 hr tablet TAKE 1 TABLET BY MOUTH DAILY. TAKE WITH OR IMMEDIATELY FOLLOWING A MEAL 90 tablet 3  . warfarin (COUMADIN) 5 MG tablet Take as directed by coumadin clinic 90 tablet 1   No current facility-administered medications on file prior to visit.      Review of Systems  Constitutional: Negative for other unusual diaphoresis or sweats HENT: Negative for  ear discharge or swelling Eyes: Negative for other worsening visual disturbances Respiratory: Negative for stridor or other swelling  Gastrointestinal: Negative for worsening distension or other blood Genitourinary: Negative for retention or other urinary change Musculoskeletal: Negative for other MSK pain or swelling Skin: Negative for color change or other new lesions Neurological: Negative for worsening tremors and other numbness  Psychiatric/Behavioral: Negative for worsening agitation or other fatigue All other system neg per pt    Objective:   Physical Exam BP 130/86   Pulse 76   Temp 97.6 F (36.4 C) (Oral)   Ht 5' 7.5"  (1.715 m)   Wt 210 lb (95.3 kg)   SpO2 93%   BMI 32.41 kg/m  VS noted, not ill appearing Constitutional: Pt appears in NAD HENT: Head: NCAT.  Right Ear: External ear normal.  Left Ear: External ear normal.  Eyes: . Pupils are equal, round, and reactive to light. Conjunctivae and EOM are normal Nose: without d/c or deformity Neck: Neck supple. Gross normal ROM Cardiovascular: Normal rate and regular rhythm.   Pulmonary/Chest: Effort normal and breath sounds without wheezing but with few persistent rales right lower lung field.  Neurological: Pt is alert. At baseline orientation, motor grossly intact Skin: Skin is warm. No rashes, other new lesions, no LE edema Psychiatric: Pt behavior is normal without agitation  No other exam findings  CHEST  2 VIEW 10/17/2017 IMPRESSION: Right lower lobe infiltrate.  Followup PA and lateral chest X-ray is recommended in 3-4 weeks following trial of antibiotic therapy to ensure resolution and exclude underlying malignancy.     Assessment & Plan:

## 2017-11-19 NOTE — Assessment & Plan Note (Signed)
Also for f/u cxr today - r/o persistent abnormality, consider CT chest

## 2017-11-19 NOTE — Assessment & Plan Note (Addendum)
etoilogy unclear, cannot r/o ILD or underlying malignancy; for cont inhaler, pulm referral

## 2017-11-19 NOTE — Patient Instructions (Signed)
Please continue all other medications as before, including the inhaler  Please have the pharmacy call with any other refills you may need.  Please keep your appointments with your specialists as you may have planned  You will be contacted regarding the referral for: Pulmonary  Please go to the XRAY Department in the Basement (go straight as you get off the elevator) for the x-ray testing  You will be contacted by phone if any changes need to be made immediately.  Otherwise, you will receive a letter about your results with an explanation, but please check with MyChart first.  Please remember to sign up for MyChart if you have not done so, as this will be important to you in the future with finding out test results, communicating by private email, and scheduling acute appointments online when needed.

## 2017-11-19 NOTE — Assessment & Plan Note (Signed)
Suggestive of ILD? For pulm referrral

## 2017-11-20 ENCOUNTER — Ambulatory Visit: Payer: Medicare Other | Admitting: Internal Medicine

## 2017-11-20 ENCOUNTER — Encounter: Payer: Self-pay | Admitting: Internal Medicine

## 2017-11-20 VITALS — BP 134/68 | HR 68 | Ht 67.5 in | Wt 207.1 lb

## 2017-11-20 DIAGNOSIS — R0600 Dyspnea, unspecified: Secondary | ICD-10-CM

## 2017-11-20 DIAGNOSIS — I10 Essential (primary) hypertension: Secondary | ICD-10-CM | POA: Diagnosis not present

## 2017-11-20 DIAGNOSIS — E782 Mixed hyperlipidemia: Secondary | ICD-10-CM | POA: Diagnosis not present

## 2017-11-20 DIAGNOSIS — I480 Paroxysmal atrial fibrillation: Secondary | ICD-10-CM

## 2017-11-20 NOTE — Progress Notes (Signed)
Cardiology Office Note   Date:  11/20/2017   ID:  Monica Mora, DOB [birth year], MRN [id number]  PCP:  Biagio Borg, MD  Cardiologist:   Dorris Carnes, MD   F/U of PAF     History of Present Illness: Monica Mora is a 83 y.o. female with a history of PAF, pericarditis.  I I saw her in Summer 2018.   Since seen  She denies CP  No palpitations.  Breathing has been short with activity  She had PFTs done and is set to be seen in Pulmonary clinic tomorrow.  Denies wheezing   No cough  No signif lower extremity edema  Current Meds  Medication Sig  . albuterol (PROVENTIL HFA;VENTOLIN HFA) 108 (90 Base) MCG/ACT inhaler Inhale 2 puffs into the lungs every 6 (six) hours as needed for wheezing or shortness of breath.  . ALPHAGAN P 0.1 % SOLN   . b complex vitamins tablet Take 1 tablet by mouth daily.    . Cholecalciferol (VITAMIN D) 1000 UNITS capsule Take 1,000 Units by mouth daily.    . dorzolamide-timolol (COSOPT) 22.3-6.8 MG/ML ophthalmic solution Place into both eyes as directed.  . fish oil-omega-3 fatty acids 1000 MG capsule Take 1 capsule by mouth daily.   . hydrochlorothiazide (MICROZIDE) 12.5 MG capsule TAKE ONE CAPSULE BY MOUTH EVERY DAY  . levothyroxine (SYNTHROID, LEVOTHROID) 75 MCG tablet Take 1 tablet (75 mcg total) by mouth daily.  Marland Kitchen lisinopril (PRINIVIL,ZESTRIL) 40 MG tablet TAKE 1 TABLET BY MOUTH DAILY  . LUMIGAN 0.01 % SOLN   . Multiple Vitamins-Minerals (CENTRUM) tablet Take 1 tablet by mouth daily.    . rosuvastatin (CRESTOR) 10 MG tablet Take 1 tablet (10 mg total) by mouth daily.  . TOPROL XL 50 MG 24 hr tablet TAKE 1 TABLET BY MOUTH DAILY. TAKE WITH OR IMMEDIATELY FOLLOWING A MEAL  . warfarin (COUMADIN) 5 MG tablet Take as directed by coumadin clinic     Allergies:   Patient has no known allergies.   Past Medical History:  Diagnosis Date  . Abnormal chest x-ray   . Atrial fibrillation ([place])   . CHF (congestive heart failure) ([place])   . Constrictive  pericarditis    s/p pericardial window in 2002 in [place]  . Glaucoma   . H/O: hysterectomy 1992  . Heart murmur   . Hyperlipidemia   . Nontoxic multinodular goiter   . Other voice and resonance disorders   . TB (tuberculosis) 2002   positive  . Unspecified essential hypertension   . Unspecified hypothyroidism     Past Surgical History:  Procedure Laterality Date  . ABDOMINAL HYSTERECTOMY  1992  . PERICARDIAL WINDOW  2002  . Radioactive Iodine for hyperthyroidism/Multinodular goiter     s/p     Social History:  The patient  reports that she quit smoking about 39 years ago. she has never used smokeless tobacco. She reports that she does not drink alcohol or use drugs.   Family History:  The patient's family history includes Arthritis in her father and mother; Asthma in her brother; Cancer in her sister; Cervical cancer in her sister.    ROS:  Please see the history of present illness. All other systems are reviewed and  Negative to the above problem except as noted.    PHYSICAL EXAM: VS:  BP 134/68   Pulse 68   Ht 5' 7.5" (1.715 m)   Wt 207 lb 1.9 oz (93.9 kg)   SpO2 92%  BMI 31.96 kg/m   GEN: Well nourished, well developed, in no acute distress  HEENT: normal  Neck: JVP normal  , carotid bruits, or masses Cardiac: RRR; no murmurs, rubs, or gallops,Triv LE edema  Wearing support socks  Respiratory:  clear to auscultation bilaterally, Moving airnormal work of breathing GI: soft, nontender, nondistended, + BS  No hepatomegaly  MS: no deformity Moving all extremities   Skin: warm and dry, no rash Neuro:  Deferred  Psych: euthymic mood, full affect   EKG:  EKG is  Not ordered today.   Lipid Panel    Component Value Date/Time   CHOL 191 08/08/2017 1357   CHOL 201 (H) 03/28/2017 1448   TRIG 159.0 (H) 08/08/2017 1357   HDL 35.50 (L) 08/08/2017 1357   HDL 38 (L) 03/28/2017 1448   CHOLHDL 5 08/08/2017 1357   VLDL 31.8 08/08/2017 1357   LDLCALC 123 (H)  08/08/2017 1357   LDLCALC 137 (H) 03/28/2017 1448   LDLDIRECT 149.7 07/17/2012 0849      Wt Readings from Last 3 Encounters:  11/20/17 207 lb 1.9 oz (93.9 kg)  11/19/17 210 lb (95.3 kg)  10/17/17 208 lb (94.3 kg)      ASSESSMENT AND PLAN: 1  PAF  Denies symptoms  Keep on current regimn  2  HTN  BP is OK     3 Dypsnea  Has eval in pulmonary tomorrow  Moving air  No wheezes  Volume does not appear bad   If pulmonrary eval not revealing would recom an ischemic eval.   Will bein touch with pt  4  HL  Futher work up based on above.    F/U in clinic in Feb      Current medicines are reviewed at length with the patient today.  The patient does not have concerns regarding medicines.  Signed, Dorris Carnes, MD  11/20/2017 4:25 PM    [facility] [place], [place],   [address] Phone: [phone]; Fax: [phone]

## 2017-11-20 NOTE — Patient Instructions (Signed)
Your physician recommends that you continue on your current medications as directed. Please refer to the Current Medication list given to you today.  Keep your appointment in the pulmonary clinic tomorrow.

## 2017-11-21 ENCOUNTER — Institutional Professional Consult (permissible substitution): Payer: Medicare Other | Admitting: Internal Medicine

## 2017-11-21 ENCOUNTER — Encounter: Payer: Self-pay | Admitting: Internal Medicine

## 2017-11-21 ENCOUNTER — Ambulatory Visit: Payer: Medicare Other | Admitting: Internal Medicine

## 2017-11-21 VITALS — BP 142/80 | HR 66 | Ht 67.0 in | Wt 207.0 lb

## 2017-11-21 DIAGNOSIS — R058 Other specified cough: Secondary | ICD-10-CM

## 2017-11-21 DIAGNOSIS — R05 Cough: Secondary | ICD-10-CM | POA: Diagnosis not present

## 2017-11-21 DIAGNOSIS — I10 Essential (primary) hypertension: Secondary | ICD-10-CM

## 2017-11-21 DIAGNOSIS — R0609 Other forms of dyspnea: Secondary | ICD-10-CM | POA: Diagnosis not present

## 2017-11-21 MED ORDER — PANTOPRAZOLE SODIUM 40 MG PO TBEC: 40.0000 mg | DELAYED_RELEASE_TABLET | Freq: Every day | ORAL | 2 refills | Status: DC

## 2017-11-21 MED ORDER — TELMISARTAN 80 MG PO TABS: 80.0000 mg | ORAL_TABLET | Freq: Every day | ORAL | 11 refills | Status: DC

## 2017-11-21 MED ORDER — FAMOTIDINE 20 MG PO TABS: ORAL_TABLET | ORAL | 2 refills | Status: DC

## 2017-11-21 NOTE — Patient Instructions (Addendum)
Pantoprazole (protonix) 40 mg   Take  30-60 min before first meal of the day and Pepcid (famotidine)  20 mg one @  bedtime until return to office - this is the best way to tell whether stomach acid is contributing to your problem.     GERD (REFLUX)  is an extremely common cause of respiratory symptoms just like yours , many times with no obvious heartburn at all.    It can be treated with medication, but also with lifestyle changes including elevation of the head of your bed (ideally with 6 inch  bed blocks),  Smoking cessation, avoidance of late meals, excessive alcohol, and avoid fatty foods, chocolate, peppermint, colas, red wine, and acidic juices such as orange juice.  NO MINT OR MENTHOL PRODUCTS SO NO COUGH DROPS   USE SUGARLESS CANDY INSTEAD (Jolley ranchers or Stover's or Life Savers) or even ice chips will also do - the key is to swallow to prevent all throat clearing. NO OIL BASED VITAMINS - use powdered substitutes.  Stop fish oil for now and eat more fish    Stop lisinopril  And start telmisartan 80  mg one daily     Please schedule a follow up office visit in 4 weeks, sooner if needed  with all medications /inhalers/ solutions in hand so we can verify exactly what you are taking. This includes all medications from all doctors and over the counters - add 6 m walk on return

## 2017-11-21 NOTE — Progress Notes (Signed)
Subjective:     Patient ID: Darrin Apodaca Ganger, female   DOB: [birth year],    MRN: [id number]  HPI   53 yobf  quit smoking 1980 with new onset doe x spring 2017 with neg cards w/u 06/10/17 (neg lexiscan) so referred to pulmonary clinic 11/21/2017 by Dr   Jenny Reichmann with h/o PAF on coumadin and also s/p remote pericardial window for "constrictive" pericarditis with abn baseline cxr   11/21/2017 1st [facility] office visit/ Alyn Jurney   Chief Complaint  Patient presents with  . Pulmonary Consult    Referred by Dr. Cathlean Cower.  Pt c/o SOB for the past year. She gets winded walking "a distance" and walking up stairs. She uses her albuterol inhaler 1-2 x per day on average. She has occ PND and non prod cough.      1st noted spring 2017 trip to [place] more doe and then steps at home more difficult x  6 months and variable has to stop at top  Also Has to walk slower when shopping  Notes sometime discomfort episgastric with exertion only not related to eating  Sleep 2 pillows  Started needing albuterol one month prior to OV  Cough started  One year prior to [place]   "it's due to drainage" but note it is dry and only daytime Treated for "pna" in Dec 2018 but never had fever/ purulent sputum or any acute illness to correlate with "pna on cxr" by radiology and no better p rx as cap  No obvious day to day or daytime variability or assoc excess/ purulent sputum or mucus plugs or hemoptysis or cp or chest tightness, subjective wheeze or overt sinus or hb symptoms. No unusual exposure hx or h/o childhood pna/ asthma or knowledge of premature birth.  Sleeping ok flat without nocturnal  or early am exacerbation  of respiratory  c/o's or need for noct saba. Also denies any obvious fluctuation of symptoms with weather or environmental changes or other aggravating or alleviating factors except as outlined above   Current Allergies, Complete Past Medical History, Past Surgical History, Family History, and Social History were  reviewed in [employer] record.  ROS  The following are not active complaints unless bolded Hoarseness, sore throat= scratchy/pnds, dysphagia, dental problems, itching, sneezing,  nasal congestion or discharge of excess mucus or purulent secretions, ear ache,   fever, chills, sweats, unintended wt loss or wt gain, classically pleuritic or exertional cp,  orthopnea pnd or leg swelling, presyncope, palpitations, abdominal pain, anorexia, nausea, vomiting, diarrhea  or change in bowel habits or change in bladder habits, change in stools or change in urine, dysuria, hematuria,  rash, arthralgias, visual complaints, headache, numbness, weakness or ataxia or problems with walking or coordination,  change in mood/affect or memory.        Current Meds  Medication Sig  . albuterol (PROVENTIL HFA;VENTOLIN HFA) 108 (90 Base) MCG/ACT inhaler Inhale 2 puffs into the lungs every 6 (six) hours as needed for wheezing or shortness of breath.  . ALPHAGAN P 0.1 % SOLN   . b complex vitamins tablet Take 1 tablet by mouth daily.    . Cholecalciferol (VITAMIN D) 1000 UNITS capsule Take 1,000 Units by mouth daily.    . dorzolamide-timolol (COSOPT) 22.3-6.8 MG/ML ophthalmic solution Place into both eyes as directed.  . hydrochlorothiazide (MICROZIDE) 12.5 MG capsule TAKE ONE CAPSULE BY MOUTH EVERY DAY  . levothyroxine (SYNTHROID, LEVOTHROID) 75 MCG tablet Take 1 tablet (75 mcg total) by mouth  daily.  Marland Kitchen LUMIGAN 0.01 % SOLN   . Multiple Vitamins-Minerals (CENTRUM) tablet Take 1 tablet by mouth daily.    . rosuvastatin (CRESTOR) 10 MG tablet Take 1 tablet (10 mg total) by mouth daily.  . TOPROL XL 50 MG 24 hr tablet TAKE 1 TABLET BY MOUTH DAILY. TAKE WITH OR IMMEDIATELY FOLLOWING A MEAL  . warfarin (COUMADIN) 5 MG tablet Take as directed by coumadin clinic  . [ ]  fish oil-omega-3 fatty acids 1000 MG capsule Take 1 capsule by mouth daily.   . [  lisinopril (PRINIVIL,ZESTRIL) 40 MG tablet TAKE 1  TABLET BY MOUTH DAILY          Review of Systems     Objective:   Physical Exam     Wt Readings from Last 3 Encounters:  11/21/17 207 lb (93.9 kg)  11/20/17 207 lb 1.9 oz (93.9 kg)  11/19/17 210 lb (95.3 kg)     Vital signs reviewed - Note on arrival 02 sats  100% on RA    amb pleasant wf with occ throat clearing and voice fatigue  HEENT: nl   turbinates bilaterally, and oropharynx which is pristine.   . Nl external ear canals without cough reflex -  Top dentures and lower partial    NECK :  without JVD/Nodes/TM/ nl carotid upstrokes bilaterally   LUNGS: no acc muscle use,  Nl contour chest which is clear to A and P bilaterally without cough on insp or exp maneuvers   CV:  RRR  no s3 or murmur or increase in P2, and no edema   ABD:  soft and nontender with nl inspiratory excursion in the supine position. No bruits or organomegaly appreciated, bowel sounds nl  MS:  Nl gait/ ext warm without deformities, calf tenderness, cyanosis or clubbing No obvious joint restrictions   SKIN: warm and dry without lesions    NEURO:  alert, approp, nl sensorium with  no motor or cerebellar deficits apparent.      I personally reviewed images and agree with radiology impression as follows:  CXR:   11/19/17 Mild bibasilar opacities, likely atelectasis      Assessment:

## 2017-11-22 ENCOUNTER — Encounter: Payer: Self-pay | Admitting: Internal Medicine

## 2017-11-22 DIAGNOSIS — R05 Cough: Secondary | ICD-10-CM | POA: Insufficient documentation

## 2017-11-22 DIAGNOSIS — R058 Other specified cough: Secondary | ICD-10-CM | POA: Insufficient documentation

## 2017-11-22 NOTE — Assessment & Plan Note (Signed)
Upper airway cough syndrome (previously labeled PNDS),  is so named because it's frequently impossible to sort out how much is  CR/sinusitis with freq throat clearing (which can be related to primary GERD)   vs  causing  secondary (" extra esophageal")  GERD from wide swings in gastric pressure that occur with throat clearing, often  promoting self use of mint and menthol lozenges that reduce the lower esophageal sphincter tone and exacerbate the problem further in a cyclical fashion.   These are the same pts (now being labeled as having "irritable larynx syndrome" by some cough centers) who not infrequently have a history of having failed to tolerate ace inhibitors,  dry powder inhalers or biphosphonates or report having atypical/extraesophageal reflux symptoms that don't respond to standard doses of PPI  and are easily confused as having aecopd or asthma flares by even experienced allergists/ pulmonologists (myself included).  Will start with trial off acei and on max gerd rx then return for f/u.

## 2017-11-22 NOTE — Assessment & Plan Note (Addendum)
-   PFT's  11/21/2017  FEV1 1.99 (108 % ) ratio 91  p no % improvement from saba p no prior to study with DLCO  29/28 % corrects to 45  % for alv volume   - Lexiscan neg 06/01/17  Neg   cxr changes in lower lobes are  non specific and look very chronic to me  (s/p remote constrictive pericarditis with window surgery)  but  does not show obvious explanation for pfts so she may have occult ILD and HRCT is warranted / will arrange further w/u on return off acei and on max gerd rx with baseline 6 min walk  Discussed in detail all the  indications, usual  risks and alternatives  relative to the benefits with patient who agrees to proceed with w/u as outlined.      Total time devoted to counseling  > 50 % of initial 60 min office visit:  review case with pt/ discussion of options/alternatives/ personally creating written customized instructions  in presence of pt  then going over those specific  Instructions directly with the pt including how to use all of the meds but in particular covering each new medication in detail and the difference between the maintenance= "automatic" meds and the prns using an action plan format for the latter (If this problem/symptom => do that organization reading Left to right).  Please see AVS from this visit for a full list of these instructions which I personally wrote for this pt and  are unique to this visit.

## 2017-11-22 NOTE — Assessment & Plan Note (Signed)
Trial off acei 11/21/2017 due to cough and unexplained doe   In the best review of chronic cough to date ( NEJM 2016 [id number]) ,  ACEi are now felt to cause cough in up to  20% of pts which is a 4 fold increase from previous reports and does not include the variety of non-specific complaints we see in pulmonary clinic in pts on ACEi but previously attributed to another dx like  Copd/asthma and  include PNDS, throat and chest congestion, "bronchitis", unexplained dyspnea and noct "strangling" sensations, and hoarseness, but also  atypical /refractory GERD symptoms like dysphagia and "bad heartburn"   The only way I know  to prove this is not an "ACEi Case" is a trial off ACEi x a minimum of 6 weeks then regroup.   rec trial of micardis 80 mg daily

## 2017-11-25 ENCOUNTER — Telehealth: Payer: Self-pay | Admitting: Internal Medicine

## 2017-11-25 NOTE — Telephone Encounter (Signed)
OK to continue the Micardis instead of the former med, as pulmonary was impressed with her cough and the former med can actually cause a cough

## 2017-11-25 NOTE — Telephone Encounter (Signed)
Copied from [place] [id number]. Topic: Quick Communication - See Telephone Encounter >> Nov 25, 2017 10:11 AM Bea Graff, NT wrote: CRM for notification. See Telephone encounter for:  Pt would like a call from Dr. Judi Cong nurse reagarding on Friday when the pt saw the pulmonologist he took her off of her 40 mg lisinopril and prescribed her 80 mg telmisartan (MICARDIS. She is unsure why. She said her bp was a little elevated at the the appt but she thinks its because she ate to many salty chips the night before. Please advise.   11/25/17.

## 2017-11-25 NOTE — Telephone Encounter (Signed)
Called pt, LVM.   

## 2017-11-25 NOTE — Telephone Encounter (Signed)
Pt made aware MD reponse

## 2017-11-26 ENCOUNTER — Telehealth: Payer: Self-pay | Admitting: Internal Medicine

## 2017-11-26 NOTE — Telephone Encounter (Signed)
New Message  Pt is calling because she had a visit with her pulmonary doc and they changed up some of her meds, so she would like to check with Harrington Challenger about the changes and also about setting up a Cat scan

## 2017-11-26 NOTE — Telephone Encounter (Signed)
Reviewed recommendations of Selinda Orion   I would make changes as he suggests   Follow response I would not schedule any testing for now  See how responds to changes in meds

## 2017-11-26 NOTE — Telephone Encounter (Signed)
Will route to Dr. Harrington Challenger to review pulmonary clinic note.

## 2017-11-27 NOTE — Telephone Encounter (Signed)
Left detailed message that Dr. Harrington Challenger reviewed the ov notes from Dr. Melvyn Novas.  She recommends no cardiology testing for now and see how she responds to changes.  Advised to call back to discuss further or if any questions.

## 2017-11-27 NOTE — Telephone Encounter (Signed)
F/U Call:  Patient calling back for update.

## 2017-12-10 ENCOUNTER — Ambulatory Visit (INDEPENDENT_AMBULATORY_CARE_PROVIDER_SITE_OTHER): Payer: Medicare Other

## 2017-12-10 DIAGNOSIS — Z7901 Long term (current) use of anticoagulants: Secondary | ICD-10-CM | POA: Diagnosis not present

## 2017-12-10 DIAGNOSIS — I4891 Unspecified atrial fibrillation: Secondary | ICD-10-CM | POA: Diagnosis not present

## 2017-12-10 DIAGNOSIS — I480 Paroxysmal atrial fibrillation: Secondary | ICD-10-CM | POA: Diagnosis not present

## 2017-12-10 LAB — POCT INR: INR: 2.6

## 2017-12-10 NOTE — Patient Instructions (Signed)
Description   Continue on same dosage 1 tablet everyday except 1/2 tablet on Mondays, Wednesdays, and Fridays.  Recheck INR in 6 weeks.  [phone] Coumadin Clinic

## 2017-12-17 ENCOUNTER — Encounter: Payer: Self-pay | Admitting: Internal Medicine

## 2017-12-17 ENCOUNTER — Ambulatory Visit: Payer: Medicare Other | Admitting: Internal Medicine

## 2017-12-17 ENCOUNTER — Ambulatory Visit (INDEPENDENT_AMBULATORY_CARE_PROVIDER_SITE_OTHER): Payer: Medicare Other | Admitting: *Deleted

## 2017-12-17 VITALS — BP 136/80 | HR 76 | Ht 67.0 in | Wt 209.0 lb

## 2017-12-17 DIAGNOSIS — R0609 Other forms of dyspnea: Secondary | ICD-10-CM

## 2017-12-17 DIAGNOSIS — R05 Cough: Secondary | ICD-10-CM | POA: Diagnosis not present

## 2017-12-17 DIAGNOSIS — R058 Other specified cough: Secondary | ICD-10-CM

## 2017-12-17 DIAGNOSIS — I10 Essential (primary) hypertension: Secondary | ICD-10-CM | POA: Diagnosis not present

## 2017-12-17 NOTE — Progress Notes (Signed)
SIX MIN WALK 12/17/2017  Medications Protonix 40mg - taken approx 10:00  Supplimental Oxygen during Test? (L/min) No  Laps 6  Partial Lap (in Meters) 24  Baseline BP (sitting) 140/68  Baseline Heartrate 79  Baseline Dyspnea (Borg Scale) 5  Baseline Fatigue (Borg Scale) 0  Baseline SPO2 95  BP (sitting) 162/88  Heartrate 106  Dyspnea (Borg Scale) 8  Fatigue (Borg Scale) 3  SPO2 90  BP (sitting) 148/76  Heartrate 88  SPO2 94  Stopped or Paused before Six Minutes No  Distance Completed 312  Tech Comments: pt was advised to walk a brisk pace, walked more of a slow to moderate pace.  tolerated walk well.

## 2017-12-17 NOTE — Progress Notes (Signed)
Subjective:     Patient ID: Monica Mora, female   DOB: Oct 03, 1935,    MRN: [id number]    Brief patient profile:  36 yobf  quit smoking 1980 with new onset doe x spring 2017 with neg cards w/u 06/10/17 (neg lexiscan) so referred to pulmonary clinic 11/21/2017 by Dr   Jenny Reichmann with h/o PAF on coumadin and also s/p remote pericardial window 2002 in [place] for "constrictive" pericarditis with abn baseline cxr    History of Present Illness  11/21/2017 1st [facility] office visit/ [facility]   Chief Complaint  Patient presents with  . Pulmonary Consult    Referred by Dr. Cathlean Cower.  Pt c/o SOB for the past year. She gets winded walking "a distance" and walking up stairs. She uses her albuterol inhaler 1-2 x per day on average. She has occ PND and non prod cough.      1st noted spring 2017 trip to [place] more doe and then steps at home more difficult x  6 months and variable has to stop at top  Also Has to walk slower when shopping  Notes sometime discomfort episgastric with exertion only not related to eating  Sleep 2 pillows  Started needing albuterol one month prior to OV  Cough started  One year prior to [place]   "it's due to drainage" but note it is dry and only daytime Treated for "pna" in Dec 2018 but never had fever/ purulent sputum or any acute illness to correlate with "pna on cxr" by radiology and no better p rx as cap Hoarseness, sore throat= scratchy/pnds, dysphagia, dental problems, itching, sneezing,  nasal congestion o  rec  Pantoprazole (protonix) 40 mg   Take  30-60 min before first meal of the day and Pepcid (famotidine)  20 mg one @  bedtime until return to office - this is the best way to tell whether stomach acid is contributing to your problem.   GERD  stop lisinopril  And start telmisartan 80  mg one daily   Please schedule a follow up office visit in 4 weeks, sooner if needed  with all medications /inhalers/ solutions in hand so we can verify exactly what you are taking.  This includes all medications from all doctors and over the counters - add 6 m walk on return    12/17/2017  f/u ov/[facility] re: chronic doe/ cough ? acei  Chief Complaint  Patient presents with  . Follow-up    6MW test done today. Breathing is unchanged. Her cough is gone. She uses albuterol inhaler once per wk on average.    Dyspnea:  Sometimes on steps / if goes slow does fine =  MMRC1 = can walk nl pace, flat grade, can't hurry or go uphills or steps s sob   Cough: denies  Sleep: 2 pillows  SABA use:  Rare  No obvious day to day or daytime variability or assoc excess/ purulent sputum or mucus plugs or hemoptysis or cp or chest tightness, subjective wheeze or overt sinus or hb symptoms. No unusual exposure hx or h/o childhood pna/ asthma or knowledge of premature birth.  Sleeping ok flat without nocturnal  or early am exacerbation  of respiratory  c/o's or need for noct saba. Also denies any obvious fluctuation of symptoms with weather or environmental changes or other aggravating or alleviating factors except as outlined above   Current Allergies, Complete Past Medical History, Past Surgical History, Family History, and Social History were reviewed in [facility] electronic medical record.  ROS  The following are not active complaints unless bolded Hoarseness, sore throat, dysphagia, dental problems, itching, sneezing,  nasal congestion or discharge of excess mucus or purulent secretions, ear ache,   fever, chills, sweats, unintended wt loss or wt gain, classically pleuritic or exertional cp,  orthopnea pnd or leg swelling, presyncope, palpitations, abdominal pain, anorexia, nausea, vomiting, diarrhea  or change in bowel habits or change in bladder habits, change in stools or change in urine, dysuria, hematuria,  rash, arthralgias, visual complaints, headache, numbness, weakness or ataxia or problems with walking or coordination,  change in mood/affect or memory.        Current Meds   Medication Sig  . albuterol (PROVENTIL HFA;VENTOLIN HFA) 108 (90 Base) MCG/ACT inhaler Inhale 2 puffs into the lungs every 6 (six) hours as needed for wheezing or shortness of breath.  . ALPHAGAN P 0.1 % SOLN   . b complex vitamins tablet Take 1 tablet by mouth daily.    . Cholecalciferol (VITAMIN D) 1000 UNITS capsule Take 1,000 Units by mouth daily.    . dorzolamide-timolol (COSOPT) 22.3-6.8 MG/ML ophthalmic solution Place into both eyes as directed.  . famotidine (PEPCID) 20 MG tablet One at bedtime  . hydrochlorothiazide (MICROZIDE) 12.5 MG capsule TAKE ONE CAPSULE BY MOUTH EVERY DAY  . levothyroxine (SYNTHROID, LEVOTHROID) 75 MCG tablet Take 1 tablet (75 mcg total) by mouth daily.  Marland Kitchen LUMIGAN 0.01 % SOLN   . pantoprazole (PROTONIX) 40 MG tablet Take 1 tablet (40 mg total) by mouth daily. Take 30-60 min before first meal of the day  . rosuvastatin (CRESTOR) 10 MG tablet Take 1 tablet (10 mg total) by mouth daily.  Marland Kitchen telmisartan (MICARDIS) 80 MG tablet Take 1 tablet (80 mg total) by mouth daily.  . TOPROL XL 50 MG 24 hr tablet TAKE 1 TABLET BY MOUTH DAILY. TAKE WITH OR IMMEDIATELY FOLLOWING A MEAL  . warfarin (COUMADIN) 5 MG tablet Take as directed by coumadin clinic              Objective:   Physical Exam     12/17/2017      209   11/21/17 207 lb (93.9 kg)  11/20/17 207 lb 1.9 oz (93.9 kg)  11/19/17 210 lb (95.3 kg)     Vital signs reviewed - Note on arrival 02 sats  93% on RA      And bp 136/80  amb bf nad no longer  throat clearing nowing     HEENT: nl  turbinates bilaterally, and oropharynx. Nl external ear canals without cough reflex - top dentures/ lower partials   NECK :  without JVD/Nodes/TM/ nl carotid upstrokes bilaterally   LUNGS: no acc muscle use,  Nl contour chest which is clear to A and P bilaterally without cough on insp or exp maneuvers   CV:  RRR  no s3 or murmur or increase in P2, and no edema   ABD:  soft and nontender with nl inspiratory  excursion in the supine position. No bruits or organomegaly appreciated, bowel sounds nl  MS:  Nl gait/ ext warm without deformities, calf tenderness, cyanosis or clubbing No obvious joint restrictions   SKIN: warm and dry without lesions    NEURO:  alert, approp, nl sensorium with  no motor or cerebellar deficits apparent.            Assessment:

## 2017-12-17 NOTE — Patient Instructions (Addendum)
No change in medications   Please schedule a follow up office visit in 6 weeks, call sooner if needed with pfts on return - error:  Needs hrct/ not pfts

## 2017-12-18 ENCOUNTER — Telehealth: Payer: Self-pay | Admitting: *Deleted

## 2017-12-18 ENCOUNTER — Encounter: Payer: Self-pay | Admitting: Internal Medicine

## 2017-12-18 DIAGNOSIS — R0602 Shortness of breath: Secondary | ICD-10-CM

## 2017-12-18 NOTE — Telephone Encounter (Signed)
Patient returning call, states she is leaving the house until about 3:30 pm this afternoon and asks for a call back then.  CB is [phone] or cell [phone]

## 2017-12-18 NOTE — Telephone Encounter (Signed)
Called pt letting her know we were cancelling the PFT that was scheduled on 4/10 but keeping her OV scheduled at 3pm with MW.  Stated to pt we were needing to have her have a HRCT done instead a few days prior to the OV so MW can go over the results of the scan with her at that visit.  Pt expressed understanding. Order placed for HRCT. Nothing further needed at this current time.

## 2017-12-18 NOTE — Telephone Encounter (Signed)
She is currently set for f/u here 01/28/18 with PFT's  Need to cancel the PFT's and set up for HRCT a few days prior to ov 4/10  LMTCB

## 2017-12-18 NOTE — Telephone Encounter (Signed)
-----   Message from Tanda Rockers, MD sent at 12/18/2017  7:36 AM EST ----- ror:  Needs hrct/ not pfts  By return (better to do a few days prior) dx doe

## 2017-12-18 NOTE — Assessment & Plan Note (Signed)
Trial off acei and on ppi rec 11/21/2017 > resolved 12/17/2017   Likely this was acei induced and can simplify rx but will wait another 6 weeks to be sure

## 2017-12-18 NOTE — Telephone Encounter (Signed)
Pt is calling back [phone]

## 2017-12-18 NOTE — Assessment & Plan Note (Addendum)
Trial off acei 11/21/2017 due to cough and unexplained doe > cough better 12/17/2017   Adequate control on present rx, reviewed in detail with pt > no change in rx needed     Although even in retrospect it may not be clear the ACEi contributed to the pt's symptoms,  Pt improved off them and adding them back at this point or in the future would risk confusion in interpretation of non-specific respiratory symptoms to which this patient is prone  ie  Better not to muddy the waters here.

## 2017-12-18 NOTE — Assessment & Plan Note (Signed)
-   PFT's  11/21/2017  FEV1 1.99 (108 % ) ratio 91  p no % improvement from saba p no prior to study with DLCO  29/28 % corrects to 45  % for alv volume   - Lexiscan neg 06/01/17  Neg  - 6 mw  312 m moderate pace, lowest sat was 90%   She may have mild ILD > HRCT next step

## 2017-12-23 ENCOUNTER — Telehealth: Payer: Self-pay | Admitting: Internal Medicine

## 2017-12-23 DIAGNOSIS — R0602 Shortness of breath: Secondary | ICD-10-CM

## 2017-12-23 NOTE — Telephone Encounter (Signed)
Called and spoke with patient she is aware and will call her once we have the HRCT scheduled.

## 2017-12-23 NOTE — Telephone Encounter (Signed)
lmtcb x1 for pt.  Will order CT after speaking to pt.

## 2017-12-23 NOTE — Telephone Encounter (Signed)
Called patient, unable to reach left message x2 for patient to give [place] a call back.

## 2017-12-23 NOTE — Telephone Encounter (Signed)
Called and spoke with patient, she stated that she has been taking the medicine given and it is not helping. Patient is still having SOB so she would like to have the CT scan sooner as well as the OV. MW please advise are you ok with doing this earlier than 6 weeks.

## 2017-12-23 NOTE — Telephone Encounter (Signed)
hrct then ov then next day with all active meds in hand from all sources

## 2017-12-23 NOTE — Telephone Encounter (Signed)
Pt is returning call. Cb is Home [phone]

## 2017-12-31 ENCOUNTER — Ambulatory Visit (INDEPENDENT_AMBULATORY_CARE_PROVIDER_SITE_OTHER)
Admission: RE | Admit: 2017-12-31 | Discharge: 2017-12-31 | Disposition: A | Discharge status: Home / Self Care | Payer: Medicare Other | Source: Ambulatory Visit | Attending: Internal Medicine | Admitting: Internal Medicine

## 2017-12-31 DIAGNOSIS — J181 Lobar pneumonia, unspecified organism: Secondary | ICD-10-CM | POA: Diagnosis not present

## 2017-12-31 DIAGNOSIS — R0602 Shortness of breath: Secondary | ICD-10-CM

## 2018-01-01 ENCOUNTER — Ambulatory Visit: Payer: Medicare Other | Admitting: Internal Medicine

## 2018-01-01 ENCOUNTER — Telehealth: Payer: Self-pay | Admitting: Internal Medicine

## 2018-01-01 ENCOUNTER — Encounter: Payer: Self-pay | Admitting: Internal Medicine

## 2018-01-01 VITALS — BP 120/80 | HR 64 | Ht 67.0 in | Wt 207.2 lb

## 2018-01-01 DIAGNOSIS — R918 Other nonspecific abnormal finding of lung field: Secondary | ICD-10-CM | POA: Diagnosis not present

## 2018-01-01 DIAGNOSIS — R0609 Other forms of dyspnea: Secondary | ICD-10-CM

## 2018-01-01 DIAGNOSIS — R911 Solitary pulmonary nodule: Secondary | ICD-10-CM | POA: Diagnosis not present

## 2018-01-01 DIAGNOSIS — R05 Cough: Secondary | ICD-10-CM | POA: Diagnosis not present

## 2018-01-01 DIAGNOSIS — R058 Other specified cough: Secondary | ICD-10-CM

## 2018-01-01 NOTE — Patient Instructions (Signed)
Please see patient coordinator before you leave today  to schedule PET scan and I will you with the results

## 2018-01-01 NOTE — Telephone Encounter (Signed)
Patient is aware nothing further needed. 

## 2018-01-01 NOTE — Progress Notes (Signed)
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 Pulmonary Consult    Referred by Dr. Cathlean Cower.  Pt c/o SOB for the past year. She gets winded walking "a distance" and walking up stairs. She uses her albuterol inhaler 1-2 x per day on average. She has occ PND and non prod cough.     1st noted spring 2017 trip to [place] more doe and then steps at home more difficult x  6 months and variable has to stop at top  Also Has to walk slower when shopping  Notes sometime discomfort episgastric with exertion only not related to eating x one year Sleep 2 pillows  Started needing albuterol one month prior to OV  Cough started  One year prior to [place]   "it's due to drainage" but note it is dry and only daytime Treated for "pna" in Dec 2018 but never had fever/ purulent sputum or any acute illness to correlate with "pna on cxr" by radiology and no better p rx as cap Hoarseness, sore throat= scratchy/pnds, dysphagia, dental problems, itching, sneezing,  nasal congestion o  rec  Pantoprazole (protonix) 40 mg   Take  30-60 min before first meal of the day and Pepcid (famotidine)  20 mg one @  bedtime until return to office - this is the best way to tell whether stomach acid is contributing to your problem.   GERD  stop lisinopril  And start telmisartan 80  mg one daily   Please schedule a follow up office visit in 4 weeks, sooner if needed  with all medications /inhalers/ solutions in hand so we can verify exactly what you  are taking. This includes all medications from all doctors and over the counters - add 6 m walk on return    12/17/2017  f/u ov/Marine Lezotte re: chronic doe/ cough ? acei  Chief Complaint  Patient presents with  . Follow-up    6MW test done today. Breathing is unchanged. Her cough is gone. She uses albuterol inhaler once per wk on average.   Dyspnea:  Sometimes on steps / if goes slow does fine =  MMRC1 = can walk nl pace, flat grade, can't hurry or go uphills or steps s sob   Cough: denies  Sleep: 2 pillows  SABA use:  Rare rec No change in meds HRCT chest  12/31/16> c/w lung ca    01/01/2018  f/u ov/Siriah Treat re:  Lung mass/ doe  Chief Complaint  Patient presents with  . Follow-up    Per pt, follow up after CT scan. Breathing has been about the same since last visit.   Dyspnea:  MMRC2 = can't walk a nl pace on a flat grade s sob but does fine slow and flat    Cough: gone since stopped lisinopril  Sleep: 2 pillows  SABA use:  Not using now   No obvious day to day or daytime variability or assoc excess/ purulent  sputum or mucus plugs or hemoptysis or cp or chest tightness, subjective wheeze or overt sinus or hb symptoms. No unusual exposure hx or h/o childhood pna/ asthma or knowledge of premature birth.  Sleeping ok flat without nocturnal  or early am exacerbation  of respiratory  c/o's or need for noct saba. Also denies any obvious fluctuation of symptoms with weather or environmental changes or other aggravating or alleviating factors except as outlined above   Current Allergies, Complete Past Medical History, Past Surgical History, Family History, and Social History were reviewed in [employer] record.  ROS  The following are not active complaints unless bolded Hoarseness, sore throat, dysphagia, dental problems, itching, sneezing,  nasal congestion or discharge of excess mucus or purulent secretions, ear ache,   fever, chills, sweats, unintended wt loss or wt gain,  classically pleuritic or exertional cp,  orthopnea pnd or leg swelling, presyncope, palpitations, abdominal pain, anorexia, nausea, vomiting, diarrhea  or change in bowel habits or change in bladder habits, change in stools or change in urine, dysuria, hematuria,  rash, arthralgias, visual complaints, headache, numbness, weakness or ataxia or problems with walking or coordination,  change in mood/affect or memory.        Current Meds  Medication Sig  . albuterol (PROVENTIL HFA;VENTOLIN HFA) 108 (90 Base) MCG/ACT inhaler Inhale 2 puffs into the lungs every 6 (six) hours as needed for wheezing or shortness of breath.  . ALPHAGAN P 0.1 % SOLN   . b complex vitamins tablet Take 1 tablet by mouth daily.    . Cholecalciferol (VITAMIN D) 1000 UNITS capsule Take 1,000 Units by mouth daily.    . dorzolamide-timolol (COSOPT) 22.3-6.8 MG/ML ophthalmic solution Place into both eyes as directed.  . famotidine (PEPCID) 20 MG tablet One at bedtime  . hydrochlorothiazide (MICROZIDE) 12.5 MG capsule TAKE ONE CAPSULE BY MOUTH EVERY DAY  . levothyroxine (SYNTHROID, LEVOTHROID) 75 MCG tablet Take 1 tablet (75 mcg total) by mouth daily.  Marland Kitchen LUMIGAN 0.01 % SOLN   . pantoprazole (PROTONIX) 40 MG tablet Take 1 tablet (40 mg total) by mouth daily. Take 30-60 min before first meal of the day  . rosuvastatin (CRESTOR) 10 MG tablet Take 1 tablet (10 mg total) by mouth daily.  Marland Kitchen telmisartan (MICARDIS) 80 MG tablet Take 1 tablet (80 mg total) by mouth daily.  . TOPROL XL 50 MG 24 hr tablet TAKE 1 TABLET BY MOUTH DAILY. TAKE WITH OR IMMEDIATELY FOLLOWING A MEAL  . warfarin (COUMADIN) 5 MG tablet Take as directed by coumadin clinic              Objective:   Physical Exam  01/01/2018         207  12/17/2017       209   11/21/17 207 lb (93.9 kg)  11/20/17 207 lb 1.9 oz (93.9 kg)  11/19/17 210 lb (95.3 kg)      Vital signs reviewed - Note on arrival 02 sats  90% on RA     amb pleasant bf nad   HEENT: nl   turbinates bilaterally, and oropharynx. Nl external ear canals without cough reflex      HEENT: nl  turbinates bilaterally, and oropharynx. Nl external ear canals without cough reflex - top dentures/ lower partials   NECK :  without JVD/Nodes/TM/ nl carotid upstrokes bilaterally   LUNGS: no acc muscle use,  Nl contour chest which is clear to A and P bilaterally without cough on insp or exp  maneuvers   CV:  RRR  no s3 or murmur or increase in P2, and no edema   ABD:  soft and nontender with nl inspiratory excursion in the supine position. No bruits or organomegaly appreciated, bowel sounds nl  MS:  Nl gait/ ext warm without deformities, calf tenderness, cyanosis or clubbing No obvious joint restrictions   SKIN: warm and dry without lesions    NEURO:  alert, approp, nl sensorium with  no motor or cerebellar deficits apparent.             Assessment:

## 2018-01-01 NOTE — Telephone Encounter (Signed)
Pt is calling back [phone]

## 2018-01-01 NOTE — Telephone Encounter (Signed)
Called and spoke with patient she states that MW called her this morning to go over results and asked that she give him a call back. She also wanted to know if she needed to keep her appointment for this afternoon if he was going to go over results on the phone. MW please advise on this patient is home all day and can take your call. Thanks.

## 2018-01-01 NOTE — Telephone Encounter (Signed)
Didn't realize she had appt > she should keep it and we can go over all the results then

## 2018-01-04 ENCOUNTER — Encounter: Payer: Self-pay | Admitting: Internal Medicine

## 2018-01-04 NOTE — Assessment & Plan Note (Signed)
-   PFT's  11/21/2017  FEV1 1.99 (108 % ) ratio 91  p no % improvement from saba p no prior to study with DLCO  29/28 % corrects to 45  % for alv volume   - Lexiscan   06/01/17  Neg  - 6 mw  312 m moderate pace, lowest sat was 90%   - Spirometry 01/01/2018  FEV1 1.76 (96%)  Ratio 89   No evidence of airflow obstruction

## 2018-01-04 NOTE — Assessment & Plan Note (Addendum)
12/31/17 HRCT Chest 1. Masslike consolidation in the right lower lobe measuring 7.3 x 6.5 cm with associated volume loss and distortion of the right major fissure. There has been persistent consolidation in this location on multiple recent chest radiographs back to 05/22/2017. In addition, there was a morphologically suspicious irregular 2.7 cm pulmonary nodule in the anterior right lower lobe on the 11/08/2008 chest CT. While nonspecific, these findings are highly concerning for right lower lobe lung adenocarcinoma. 2. Numerous subsolid and solid pulmonary nodules in both lungs, worrisome for pulmonary metastases. 3. Patchy interlobular septal thickening in both lungs, right greater than left, worrisome for lymphangitic spread of tumor. 4. New nonspecific right paratracheal, subcarinal and bilateral hilar lymphadenopathy. Nodal metastases not excluded.  Had remote pericardial window in 2002 and I had assumed some of the changes in the bases medially were related to this but CT is more c/w lung ca, probably adeno with widespread mets and first step is PET to determine easiest access for bx  Discussed in detail all the  indications, usual  risks and alternatives  relative to the benefits with patient who agrees to proceed with w/u as outlined.     I had an extended discussion with the patient reviewing all relevant studies completed to date and  lasting 15 to 20 minutes of a 25 minute visit    Each maintenance medication was reviewed in detail including most importantly the difference between maintenance and prns and under what circumstances the prns are to be triggered using an action plan format that is not reflected in the computer generated alphabetically organized AVS.    Please see AVS for specific instructions unique to this visit that I personally wrote and verbalized to the the pt in detail and then reviewed with pt  by my nurse highlighting any  changes in therapy recommended at  today's visit to their plan of care.

## 2018-01-04 NOTE — Assessment & Plan Note (Addendum)
Trial off acei and on ppi rec 11/21/2017 > improved  12/17/2017 > resolved 01/01/2018   Cough remains resolved off acei despite obvious lung dz - see lung mass.   Although even in retrospect it may not be clear the ACEi contributed to the pt's symptoms,  Pt improved off them and adding them back at this point or in the future would risk confusion in interpretation of non-specific respiratory symptoms to which this patient is prone  ie  Better not to muddy the waters here and maintain off acei indefinitely

## 2018-01-09 ENCOUNTER — Encounter (HOSPITAL_COMMUNITY)
Admission: RE | Admit: 2018-01-09 | Discharge: 2018-01-09 | Disposition: A | Discharge status: Home / Self Care | Payer: Medicare Other | Source: Ambulatory Visit | Attending: Internal Medicine | Admitting: Internal Medicine

## 2018-01-09 DIAGNOSIS — R918 Other nonspecific abnormal finding of lung field: Secondary | ICD-10-CM | POA: Insufficient documentation

## 2018-01-09 DIAGNOSIS — R911 Solitary pulmonary nodule: Secondary | ICD-10-CM | POA: Diagnosis not present

## 2018-01-09 LAB — GLUCOSE, CAPILLARY: Glucose-Capillary: 87 mg/dL (ref 65–99)

## 2018-01-09 MED ORDER — FLUDEOXYGLUCOSE F - 18 (FDG) INJECTION: 10.3000 | Freq: Once | INTRAVENOUS | Status: DC | PRN

## 2018-01-15 DIAGNOSIS — H401123 Primary open-angle glaucoma, left eye, severe stage: Secondary | ICD-10-CM | POA: Diagnosis not present

## 2018-01-15 DIAGNOSIS — H401112 Primary open-angle glaucoma, right eye, moderate stage: Secondary | ICD-10-CM | POA: Diagnosis not present

## 2018-01-16 ENCOUNTER — Telehealth: Payer: Self-pay | Admitting: Internal Medicine

## 2018-01-16 MED ORDER — ALBUTEROL SULFATE HFA 108 (90 BASE) MCG/ACT IN AERS: 2.0000 | INHALATION_SPRAY | Freq: Four times a day (QID) | RESPIRATORY_TRACT | 3 refills | Status: DC | PRN

## 2018-01-16 NOTE — Telephone Encounter (Signed)
Called and spoke with pt verifying pt's med and pharmacy that we needed to send med in to.  After verifying needed information, script sent to pt's pharmacy.  Nothing further needed at this time.

## 2018-01-19 ENCOUNTER — Telehealth: Payer: Self-pay | Admitting: Internal Medicine

## 2018-01-19 ENCOUNTER — Telehealth: Payer: Self-pay | Admitting: Pharmacist

## 2018-01-19 NOTE — Telephone Encounter (Signed)
Spoke with pt. States that she is scheduled for a bronch on 4/3. Reports that she accidentally took her ASA on Saturday. She has not taken it since then.  MW - please advise if it's okay to proceed with the bronch. Thanks.

## 2018-01-19 NOTE — Telephone Encounter (Signed)
Pt called clinic stating she has been holding her Coumadin for 5 days total in preparation for video bronchoscopy on 4/3 with Dr Melvyn Novas. She states she accidentally took her Coumadin on Saturday.  We never received notification of this procedure and had not provided clearance for patient to hold her Coumadin. In reviewing patient's risk factors, she takes Coumadin for afib with CHADS2VASc score of 5 (age x2, sex, HTN, CHF). She is ok to hold Coumadin for 5 days prior. Advised her to eat some greens today and we will check her INR tomorrow the day before her procedure to ensure her INR is low enough to proceed with procedure.

## 2018-01-19 NOTE — Telephone Encounter (Signed)
No problem.

## 2018-01-19 NOTE — Telephone Encounter (Signed)
Spoke with pt. She is aware of MW's response. Nothing further was needed.

## 2018-01-20 ENCOUNTER — Ambulatory Visit (INDEPENDENT_AMBULATORY_CARE_PROVIDER_SITE_OTHER): Payer: Medicare Other | Admitting: *Deleted

## 2018-01-20 DIAGNOSIS — I4891 Unspecified atrial fibrillation: Secondary | ICD-10-CM | POA: Diagnosis not present

## 2018-01-20 DIAGNOSIS — I480 Paroxysmal atrial fibrillation: Secondary | ICD-10-CM

## 2018-01-20 DIAGNOSIS — Z7901 Long term (current) use of anticoagulants: Secondary | ICD-10-CM | POA: Diagnosis not present

## 2018-01-20 LAB — POCT INR: INR: 1.7

## 2018-01-20 NOTE — Patient Instructions (Signed)
Description   Resume your same dosage 1 tablet everyday except 1/2 tablet on Mondays, Wednesdays, and Fridays when ok with Dr Melvyn Novas.   Recheck INR in 1 week.   [phone] [facility]

## 2018-01-21 ENCOUNTER — Ambulatory Visit (HOSPITAL_COMMUNITY)
Admission: RE | Admit: 2018-01-21 | Discharge: 2018-01-21 | Disposition: A | Discharge status: Home / Self Care | Payer: Medicare Other | Source: Ambulatory Visit | Attending: Internal Medicine | Admitting: Internal Medicine

## 2018-01-21 ENCOUNTER — Encounter (HOSPITAL_COMMUNITY): Payer: Medicare Other

## 2018-01-21 ENCOUNTER — Ambulatory Visit (HOSPITAL_COMMUNITY): Payer: Medicare Other

## 2018-01-21 ENCOUNTER — Encounter (HOSPITAL_COMMUNITY)
Admission: RE | Disposition: A | Discharge status: Home / Self Care | Payer: Self-pay | Source: Ambulatory Visit | Attending: Internal Medicine

## 2018-01-21 DIAGNOSIS — R0602 Shortness of breath: Secondary | ICD-10-CM | POA: Diagnosis not present

## 2018-01-21 DIAGNOSIS — R067 Sneezing: Secondary | ICD-10-CM | POA: Insufficient documentation

## 2018-01-21 DIAGNOSIS — J029 Acute pharyngitis, unspecified: Secondary | ICD-10-CM | POA: Diagnosis not present

## 2018-01-21 DIAGNOSIS — K219 Gastro-esophageal reflux disease without esophagitis: Secondary | ICD-10-CM | POA: Diagnosis not present

## 2018-01-21 DIAGNOSIS — Z79899 Other long term (current) drug therapy: Secondary | ICD-10-CM | POA: Insufficient documentation

## 2018-01-21 DIAGNOSIS — I4891 Unspecified atrial fibrillation: Secondary | ICD-10-CM | POA: Insufficient documentation

## 2018-01-21 DIAGNOSIS — R911 Solitary pulmonary nodule: Secondary | ICD-10-CM | POA: Diagnosis present

## 2018-01-21 DIAGNOSIS — R490 Dysphonia: Secondary | ICD-10-CM | POA: Diagnosis not present

## 2018-01-21 DIAGNOSIS — Z9889 Other specified postprocedural states: Secondary | ICD-10-CM

## 2018-01-21 DIAGNOSIS — Z7901 Long term (current) use of anticoagulants: Secondary | ICD-10-CM | POA: Diagnosis not present

## 2018-01-21 DIAGNOSIS — R918 Other nonspecific abnormal finding of lung field: Secondary | ICD-10-CM | POA: Insufficient documentation

## 2018-01-21 DIAGNOSIS — K089 Disorder of teeth and supporting structures, unspecified: Secondary | ICD-10-CM | POA: Diagnosis not present

## 2018-01-21 DIAGNOSIS — R1310 Dysphagia, unspecified: Secondary | ICD-10-CM | POA: Diagnosis not present

## 2018-01-21 DIAGNOSIS — R846 Abnormal cytological findings in specimens from respiratory organs and thorax: Secondary | ICD-10-CM | POA: Diagnosis not present

## 2018-01-21 DIAGNOSIS — J9811 Atelectasis: Secondary | ICD-10-CM | POA: Diagnosis not present

## 2018-01-21 DIAGNOSIS — J984 Other disorders of lung: Secondary | ICD-10-CM | POA: Diagnosis not present

## 2018-01-21 HISTORY — PX: VIDEO BRONCHOSCOPY: SHX5072

## 2018-01-21 SURGERY — BRONCHOSCOPY, WITH FLUOROSCOPY
Anesthesia: Moderate Sedation | Laterality: Bilateral

## 2018-01-21 MED ORDER — PHENYLEPHRINE HCL 0.25 % NA SOLN
NASAL | Status: DC | PRN
  Administered 2018-01-21: 2 via NASAL

## 2018-01-21 MED ORDER — MEPERIDINE HCL 25 MG/ML IJ SOLN
INTRAMUSCULAR | Status: DC | PRN
  Administered 2018-01-21: 50 mg via INTRAVENOUS

## 2018-01-21 MED ORDER — MIDAZOLAM HCL 10 MG/2ML IJ SOLN
INTRAMUSCULAR | Status: DC | PRN
  Administered 2018-01-21 (×2): 2.5 mg via INTRAVENOUS

## 2018-01-21 MED ORDER — LIDOCAINE HCL 1 % IJ SOLN
INTRAMUSCULAR | Status: DC | PRN
  Administered 2018-01-21: 6 mL via RESPIRATORY_TRACT

## 2018-01-21 MED ORDER — SODIUM CHLORIDE 0.9 % IV SOLN
INTRAVENOUS | Status: DC
Start: 2018-01-21 — End: 2018-01-21
  Administered 2018-01-21: 09:00:00 via INTRAVENOUS

## 2018-01-21 MED ORDER — MEPERIDINE HCL 100 MG/ML IJ SOLN
INTRAMUSCULAR | Status: AC
  Filled 2018-01-21: qty 2

## 2018-01-21 MED ORDER — MEPERIDINE HCL 100 MG/ML IJ SOLN: 100.0000 mg | Freq: Once | INTRAMUSCULAR | Status: DC

## 2018-01-21 MED ORDER — PHENYLEPHRINE HCL 0.25 % NA SOLN: 1.0000 | Freq: Four times a day (QID) | NASAL | Status: DC | PRN

## 2018-01-21 MED ORDER — LIDOCAINE HCL 2 % EX GEL
CUTANEOUS | Status: DC | PRN
  Administered 2018-01-21: 1

## 2018-01-21 MED ORDER — MIDAZOLAM HCL 5 MG/ML IJ SOLN: 1.0000 mg | Freq: Once | INTRAMUSCULAR | Status: DC

## 2018-01-21 MED ORDER — LIDOCAINE HCL 2 % EX GEL: 1.0000 "application " | Freq: Once | CUTANEOUS | Status: DC

## 2018-01-21 MED ORDER — MIDAZOLAM HCL 5 MG/ML IJ SOLN
INTRAMUSCULAR | Status: AC
  Filled 2018-01-21: qty 2

## 2018-01-21 NOTE — Progress Notes (Signed)
Video Bronchoscopy done  Intervention Bronchial washing done Intervention Bronchial biopsy done Procedure tolerated well

## 2018-01-21 NOTE — H&P (View-Only) (Signed)
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 Pulmonary Consult    Referred by Dr. Cathlean Cower.  Pt c/o SOB for the past year. She gets winded walking "a distance" and walking up stairs. She uses her albuterol inhaler 1-2 x per day on average. She has occ PND and non prod cough.     1st noted spring 2017 trip to [place] more doe and then steps at home more difficult x  6 months and variable has to stop at top  Also Has to walk slower when shopping  Notes sometime discomfort episgastric with exertion only not related to eating x one year Sleep 2 pillows  Started needing albuterol one month prior to OV  Cough started  One year prior to [place]   "it's due to drainage" but note it is dry and only daytime Treated for "pna" in Dec 2018 but never had fever/ purulent sputum or any acute illness to correlate with "pna on cxr" by radiology and no better p rx as cap Hoarseness, sore throat= scratchy/pnds, dysphagia, dental problems, itching, sneezing,  nasal congestion o  rec  Pantoprazole (protonix) 40 mg   Take  30-60 min before first meal of the day and Pepcid (famotidine)  20 mg one @  bedtime until return to office - this is the best way to tell whether stomach acid is contributing to your problem.   GERD  stop lisinopril  And start telmisartan 80  mg one daily   Please schedule a follow up office visit in 4 weeks, sooner if needed  with all medications /inhalers/ solutions in hand so we can verify exactly what you are  taking. This includes all medications from all doctors and over the counters - add 6 m walk on return    12/17/2017  f/u ov/Monica Mora re: chronic doe/ cough ? acei      Chief Complaint  Patient presents with  . Follow-up    6MW test done today. Breathing is unchanged. Her cough is gone. She uses albuterol inhaler once per wk on average.   Dyspnea:  Sometimes on steps / if goes slow does fine =  MMRC1 = can walk nl pace, flat grade, can't hurry or go uphills or steps s sob   Cough: denies  Sleep: 2 pillows  SABA use:  Rare rec No change in meds HRCT chest  12/31/16> c/w lung ca    01/01/2018  f/u ov/Monica Mora re:  Lung mass/ doe      Chief Complaint  Patient presents with  . Follow-up    Per pt, follow up after CT scan. Breathing has been about the same since last visit.   Dyspnea:  MMRC2 = can't walk a nl pace on a flat grade s sob but does fine slow and flat    Cough: gone since stopped lisinopril  Sleep: 2 pillows  SABA use:  Not using now   No obvious day to day  or daytime variability or assoc excess/ purulent sputum or mucus plugs or hemoptysis or cp or chest tightness, subjective wheeze or overt sinus or hb symptoms. No unusual exposure hx or h/o childhood pna/ asthma or knowledge of premature birth.  Sleeping ok flat without nocturnal  or early am exacerbation  of respiratory  c/o's or need for noct saba. Also denies any obvious fluctuation of symptoms with weather or environmental changes or other aggravating or alleviating factors except as outlined above   Current Allergies, Complete Past Medical History, Past Surgical History, Family History, and Social History were reviewed in [employer] record.  ROS  The following are not active complaints unless bolded Hoarseness, sore throat, dysphagia, dental problems, itching, sneezing,  nasal congestion or discharge of excess mucus or purulent secretions, ear ache,   fever, chills, sweats, unintended wt  loss or wt gain, classically pleuritic or exertional cp,  orthopnea pnd or leg swelling, presyncope, palpitations, abdominal pain, anorexia, nausea, vomiting, diarrhea  or change in bowel habits or change in bladder habits, change in stools or change in urine, dysuria, hematuria,  rash, arthralgias, visual complaints, headache, numbness, weakness or ataxia or problems with walking or coordination,  change in mood/affect or memory.            Current Meds  Medication Sig  . albuterol (PROVENTIL HFA;VENTOLIN HFA) 108 (90 Base) MCG/ACT inhaler Inhale 2 puffs into the lungs every 6 (six) hours as needed for wheezing or shortness of breath.  . ALPHAGAN P 0.1 % SOLN   . b complex vitamins tablet Take 1 tablet by mouth daily.    . Cholecalciferol (VITAMIN D) 1000 UNITS capsule Take 1,000 Units by mouth daily.    . dorzolamide-timolol (COSOPT) 22.3-6.8 MG/ML ophthalmic solution Place into both eyes as directed.  . famotidine (PEPCID) 20 MG tablet One at bedtime  . hydrochlorothiazide (MICROZIDE) 12.5 MG capsule TAKE ONE CAPSULE BY MOUTH EVERY DAY  . levothyroxine (SYNTHROID, LEVOTHROID) 75 MCG tablet Take 1 tablet (75 mcg total) by mouth daily.  Marland Kitchen LUMIGAN 0.01 % SOLN   . pantoprazole (PROTONIX) 40 MG tablet Take 1 tablet (40 mg total) by mouth daily. Take 30-60 min before first meal of the day  . rosuvastatin (CRESTOR) 10 MG tablet Take 1 tablet (10 mg total) by mouth daily.  Marland Kitchen telmisartan (MICARDIS) 80 MG tablet Take 1 tablet (80 mg total) by mouth daily.  . TOPROL XL 50 MG 24 hr tablet TAKE 1 TABLET BY MOUTH DAILY. TAKE WITH OR IMMEDIATELY FOLLOWING A MEAL  . warfarin (COUMADIN) 5 MG tablet Take as directed by coumadin clinic              Objective:   Physical Exam  01/01/2018         207     12/17/2017       209   11/21/17 207 lb (93.9 kg)  11/20/17 207 lb 1.9 oz (93.9 kg)  11/19/17 210 lb (95.3 kg)      Vital signs reviewed - Note on arrival 02 sats  90% on RA     amb  pleasant bf nad   HEENT: nl  turbinates bilaterally, and oropharynx. Nl external ear canals without cough reflex      HEENT: nl  turbinates bilaterally, and oropharynx. Nl external ear canals without cough reflex - top dentures/ lower partials   NECK :  without JVD/Nodes/TM/ nl carotid upstrokes bilaterally   LUNGS: no acc muscle use,  Nl contour chest  which is clear to A and P bilaterally without cough on insp or exp maneuvers   CV:  RRR  no s3 or murmur or increase in P2, and no edema   ABD:  soft and nontender with nl inspiratory excursion in the supine position. No bruits or organomegaly appreciated, bowel sounds nl  MS:  Nl gait/ ext warm without deformities, calf tenderness, cyanosis or clubbing No obvious joint restrictions   SKIN: warm and dry without lesions    NEURO:  alert, approp, nl sensorium with  no motor or cerebellar deficits apparent.             Assessment:                 Assessment & Plan Note by Tanda Rockers, MD at 01/04/2018 7:39 AM   Author: Tanda Rockers, MD Author Type: Physician Filed: 01/04/2018 7:45 AM  Note Status: Written Cosign: Cosign Not Required Encounter Date: 01/01/2018  Problem: Dyspnea on exertion  Editor: Tanda Rockers, MD (Physician)    - PFT's  11/21/2017  FEV1 1.99 (108 % ) ratio 91  p no % improvement from saba p no prior to study with DLCO  29/28 % corrects to 45  % for alv volume   - Lexiscan   06/01/17  Neg  - 6 mw  312 m moderate pace, lowest sat was 90%   - Spirometry 01/01/2018  FEV1 1.76 (96%)  Ratio 89   No evidence of airflow obstruction        Assessment & Plan Note by Tanda Rockers, MD at 01/04/2018 7:32 AM   Author: Tanda Rockers, MD Author Type: Physician Filed: 01/04/2018 7:37 AM  Note Status: Bernell List: Cosign Not Required Encounter Date: 01/01/2018  Problem: Upper airway cough syndrome  Editor: Tanda Rockers, MD (Physician)  Prior Versions: 1. Tanda Rockers, MD (Physician) at 01/04/2018 7:32 AM - Written    Trial off acei and on ppi rec 11/21/2017 > improved  12/17/2017 > resolved 01/01/2018   Cough remains resolved off acei despite obvious lung dz - see lung mass.   Although even in retrospect it may not be clear the ACEi contributed to the pt's symptoms,  Pt improved off them and adding them back at this point or in the future would risk confusion in interpretation of non-specific respiratory symptoms to which this patient is prone  ie  Better not to muddy the waters here and maintain off acei indefinitely        Assessment & Plan Note by Tanda Rockers, MD at 01/04/2018 7:23 AM   Author: Tanda Rockers, MD Author Type: Physician Filed: 01/04/2018 7:31 AM  Note Status: Bernell List: Cosign Not Required Encounter Date: 01/01/2018  Problem: Right lower lobe lung mass  Editor: Tanda Rockers, MD (Physician)  Prior Versions: 1. Tanda Rockers, MD (Physician) at 01/04/2018 7:24 AM - Written    12/31/17 HRCT Chest 1. Masslike consolidation in the right lower lobe measuring 7.3 x 6.5 cm with associated volume loss and distortion of the right major fissure. There has been persistent consolidation in this location on multiple recent chest radiographs back to 05/22/2017. In addition, there was a morphologically suspicious irregular 2.7 cm pulmonary nodule in the anterior right lower lobe on the 11/08/2008 chest CT. While nonspecific, these findings are highly concerning for right lower lobe lung adenocarcinoma. 2. Numerous subsolid and solid pulmonary nodules in both lungs, worrisome for pulmonary metastases. 3.  Patchy interlobular septal thickening in both lungs, right greater than left, worrisome for lymphangitic spread of tumor. 4. New nonspecific right paratracheal, subcarinal and bilateral hilar lymphadenopathy. Nodal metastases not excluded.  Had remote pericardial window in 2002 and I had assumed some of the changes in the bases  medially were related to this but CT is more c/w lung ca, probably adeno with widespread mets and first step is PET to determine easiest access for bx  Discussed in detail all the  indications, usual  risks and alternatives  relative to the benefits with patient who agrees to proceed with w/u as outlined.     I had an extended discussion with the patient reviewing all relevant studies completed to date and  lasting 15 to 20 minutes of a 25 minute visit    Each maintenance medication was reviewed in detail including most importantly the difference between maintenance and prns and under what circumstances the prns are to be triggered using an action plan format that is not reflected in the computer generated alphabetically organized AVS.    Please see AVS for specific instructions unique to this visit that I personally wrote and verbalized to the the pt in detail and then reviewed with pt  by my nurse highlighting any  changes in therapy recommended at today's visit to their plan of care.            Patient Instructions by Tanda Rockers, MD at 01/01/2018 2:00 PM   Author: Tanda Rockers, MD Author Type: Physician Filed: 01/01/2018 2:26 PM  Note Status: Signed Cosign: Cosign Not Required Encounter Date: 01/01/2018  Editor: Tanda Rockers, MD (Physician)    Please see patient coordinator before you leave today  to schedule PET scan and I will you with the results        PET  01/09/18 Mass-like opacity in the right lower lobe measuring at least 6.0 cm, worrisome for primary bronchogenic neoplasm such as adenocarcinoma. Additional multifocal subsolid/ground-glass nodules, worrisome for metastases/multifocal adenocarcinoma.  Suspected mediastinal and bilateral hilar nodal metastases.   Discussed in detail all the  indications, usual  risks and alternatives  relative to the benefits with patient who agrees to proceed with bronchoscopy with biopsy.     01/21/2018 day of FOB/  note INR 1.7  01/20/18 and coumadin on hold / no change hx or exam   Christinia Gully, MD Pulmonary and [place] [phone] After 5:30 PM or weekends, use [facility] [phone]

## 2018-01-21 NOTE — Op Note (Signed)
Bronchoscopy Procedure Note  Date of Operation: 01/21/2018   Pre-op Diagnosis: lung mass  Post-op Diagnosis: same  Surgeon: Christinia Gully  Anesthesia: Monitored Local Anesthesia with Sedation Time Started: 0920 total of demerol 50 mg IV/ versed 5 mg IV Time Stopped:  0940  Operation: Video Flexible fiberoptic bronchoscopy, diagnostic   Findings: extrinsic swelling of airways below sup segment take off on R  Specimen: BAL/tbbx RLL  Estimated Blood Loss: min  Complications: desat p demerol, responded to pause in procedure, w/d scope and jaw lift then proceeded once sats consistently > 90% on NP/fm supplement prn  Indications and History: See updated H and P same date. The risks, benefits, complications, treatment options and expected outcomes were discussed with the patient.  The possibilities of reaction to medication, pulmonary aspiration, perforation of a viscus, bleeding, failure to diagnose a condition and creating a complication requiring transfusion or operation were discussed with the patient who freely signed the consent.    Description of Procedure: The patient was re-examined in the bronchoscopy suite and the site of surgery properly noted/marked.  The patient was identified  and the procedure verified as Flexible Fiberoptic Bronchoscopy.  A Time Out was held and the above information confirmed.   After the induction of topical nasopharyngeal anesthesia, the patient was positioned  and the bronchoscope was passed through the R naris. The vocal cords were visualized and  1% buffered lidocaine 5 ml was topically placed onto the cords. The cords were nl. The scope was then passed into the trachea.  1% buffered lidocaine given topically. Airways inspected bilaterally to the subsegmental level with the following findings:  Nl airways x extrinsic swelling of airways below sup segment take off on R  Interventions: BAL sup segment RLL/ tbbx x 4 RLL sup segment by fluoro      The  Patient was taken to the Endoscopy Recovery area in satisfactory condition.  Attestation: I performed the procedure.  Christinia Gully, MD Pulmonary and [place] [phone] After 5:30 PM or weekends, call [phone]  2

## 2018-01-21 NOTE — H&P (Signed)
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 Pulmonary Consult    Referred by Dr. Cathlean Cower.  Pt c/o SOB for the past year. She gets winded walking "a distance" and walking up stairs. She uses her albuterol inhaler 1-2 x per day on average. She has occ PND and non prod cough.     1st noted spring 2017 trip to [place] more doe and then steps at home more difficult x  6 months and variable has to stop at top  Also Has to walk slower when shopping  Notes sometime discomfort episgastric with exertion only not related to eating x one year Sleep 2 pillows  Started needing albuterol one month prior to OV  Cough started  One year prior to [place]   "it's due to drainage" but note it is dry and only daytime Treated for "pna" in Dec 2018 but never had fever/ purulent sputum or any acute illness to correlate with "pna on cxr" by radiology and no better p rx as cap Hoarseness, sore throat= scratchy/pnds, dysphagia, dental problems, itching, sneezing,  nasal congestion o  rec  Pantoprazole (protonix) 40 mg   Take  30-60 min before first meal of the day and Pepcid (famotidine)  20 mg one @  bedtime until return to office - this is the best way to tell whether stomach acid is contributing to your problem.   GERD  stop lisinopril  And start telmisartan 80  mg one daily   Please schedule a follow up office visit in 4 weeks, sooner if needed  with all medications /inhalers/ solutions in hand so we can verify exactly what you are  taking. This includes all medications from all doctors and over the counters - add 6 m walk on return    12/17/2017  f/u ov/Suella Cogar re: chronic doe/ cough ? acei      Chief Complaint  Patient presents with  . Follow-up    6MW test done today. Breathing is unchanged. Her cough is gone. She uses albuterol inhaler once per wk on average.   Dyspnea:  Sometimes on steps / if goes slow does fine =  MMRC1 = can walk nl pace, flat grade, can't hurry or go uphills or steps s sob   Cough: denies  Sleep: 2 pillows  SABA use:  Rare rec No change in meds HRCT chest  12/31/16> c/w lung ca    01/01/2018  f/u ov/Teva Bronkema re:  Lung mass/ doe      Chief Complaint  Patient presents with  . Follow-up    Per pt, follow up after CT scan. Breathing has been about the same since last visit.   Dyspnea:  MMRC2 = can't walk a nl pace on a flat grade s sob but does fine slow and flat    Cough: gone since stopped lisinopril  Sleep: 2 pillows  SABA use:  Not using now   No obvious day to day  or daytime variability or assoc excess/ purulent sputum or mucus plugs or hemoptysis or cp or chest tightness, subjective wheeze or overt sinus or hb symptoms. No unusual exposure hx or h/o childhood pna/ asthma or knowledge of premature birth.  Sleeping ok flat without nocturnal  or early am exacerbation  of respiratory  c/o's or need for noct saba. Also denies any obvious fluctuation of symptoms with weather or environmental changes or other aggravating or alleviating factors except as outlined above   Current Allergies, Complete Past Medical History, Past Surgical History, Family History, and Social History were reviewed in [employer] record.  ROS  The following are not active complaints unless bolded Hoarseness, sore throat, dysphagia, dental problems, itching, sneezing,  nasal congestion or discharge of excess mucus or purulent secretions, ear ache,   fever, chills, sweats, unintended wt  loss or wt gain, classically pleuritic or exertional cp,  orthopnea pnd or leg swelling, presyncope, palpitations, abdominal pain, anorexia, nausea, vomiting, diarrhea  or change in bowel habits or change in bladder habits, change in stools or change in urine, dysuria, hematuria,  rash, arthralgias, visual complaints, headache, numbness, weakness or ataxia or problems with walking or coordination,  change in mood/affect or memory.            Current Meds  Medication Sig  . albuterol (PROVENTIL HFA;VENTOLIN HFA) 108 (90 Base) MCG/ACT inhaler Inhale 2 puffs into the lungs every 6 (six) hours as needed for wheezing or shortness of breath.  . ALPHAGAN P 0.1 % SOLN   . b complex vitamins tablet Take 1 tablet by mouth daily.    . Cholecalciferol (VITAMIN D) 1000 UNITS capsule Take 1,000 Units by mouth daily.    . dorzolamide-timolol (COSOPT) 22.3-6.8 MG/ML ophthalmic solution Place into both eyes as directed.  . famotidine (PEPCID) 20 MG tablet One at bedtime  . hydrochlorothiazide (MICROZIDE) 12.5 MG capsule TAKE ONE CAPSULE BY MOUTH EVERY DAY  . levothyroxine (SYNTHROID, LEVOTHROID) 75 MCG tablet Take 1 tablet (75 mcg total) by mouth daily.  Marland Kitchen LUMIGAN 0.01 % SOLN   . pantoprazole (PROTONIX) 40 MG tablet Take 1 tablet (40 mg total) by mouth daily. Take 30-60 min before first meal of the day  . rosuvastatin (CRESTOR) 10 MG tablet Take 1 tablet (10 mg total) by mouth daily.  Marland Kitchen telmisartan (MICARDIS) 80 MG tablet Take 1 tablet (80 mg total) by mouth daily.  . TOPROL XL 50 MG 24 hr tablet TAKE 1 TABLET BY MOUTH DAILY. TAKE WITH OR IMMEDIATELY FOLLOWING A MEAL  . warfarin (COUMADIN) 5 MG tablet Take as directed by coumadin clinic              Objective:   Physical Exam  01/01/2018         207     12/17/2017       209   11/21/17 207 lb (93.9 kg)  11/20/17 207 lb 1.9 oz (93.9 kg)  11/19/17 210 lb (95.3 kg)      Vital signs reviewed - Note on arrival 02 sats  90% on RA     amb  pleasant bf nad   HEENT: nl  turbinates bilaterally, and oropharynx. Nl external ear canals without cough reflex      HEENT: nl  turbinates bilaterally, and oropharynx. Nl external ear canals without cough reflex - top dentures/ lower partials   NECK :  without JVD/Nodes/TM/ nl carotid upstrokes bilaterally   LUNGS: no acc muscle use,  Nl contour chest  which is clear to A and P bilaterally without cough on insp or exp maneuvers   CV:  RRR  no s3 or murmur or increase in P2, and no edema   ABD:  soft and nontender with nl inspiratory excursion in the supine position. No bruits or organomegaly appreciated, bowel sounds nl  MS:  Nl gait/ ext warm without deformities, calf tenderness, cyanosis or clubbing No obvious joint restrictions   SKIN: warm and dry without lesions    NEURO:  alert, approp, nl sensorium with  no motor or cerebellar deficits apparent.             Assessment:                 Assessment & Plan Note by Tanda Rockers, MD at 01/04/2018 7:39 AM   Author: Tanda Rockers, MD Author Type: Physician Filed: 01/04/2018 7:45 AM  Note Status: Written Cosign: Cosign Not Required Encounter Date: 01/01/2018  Problem: Dyspnea on exertion  Editor: Tanda Rockers, MD (Physician)    - PFT's  11/21/2017  FEV1 1.99 (108 % ) ratio 91  p no % improvement from saba p no prior to study with DLCO  29/28 % corrects to 45  % for alv volume   - Lexiscan   06/01/17  Neg  - 6 mw  312 m moderate pace, lowest sat was 90%   - Spirometry 01/01/2018  FEV1 1.76 (96%)  Ratio 89   No evidence of airflow obstruction        Assessment & Plan Note by Tanda Rockers, MD at 01/04/2018 7:32 AM   Author: Tanda Rockers, MD Author Type: Physician Filed: 01/04/2018 7:37 AM  Note Status: Bernell List: Cosign Not Required Encounter Date: 01/01/2018  Problem: Upper airway cough syndrome  Editor: Tanda Rockers, MD (Physician)  Prior Versions: 1. Tanda Rockers, MD (Physician) at 01/04/2018 7:32 AM - Written    Trial off acei and on ppi rec 11/21/2017 > improved  12/17/2017 > resolved 01/01/2018   Cough remains resolved off acei despite obvious lung dz - see lung mass.   Although even in retrospect it may not be clear the ACEi contributed to the pt's symptoms,  Pt improved off them and adding them back at this point or in the future would risk confusion in interpretation of non-specific respiratory symptoms to which this patient is prone  ie  Better not to muddy the waters here and maintain off acei indefinitely        Assessment & Plan Note by Tanda Rockers, MD at 01/04/2018 7:23 AM   Author: Tanda Rockers, MD Author Type: Physician Filed: 01/04/2018 7:31 AM  Note Status: Bernell List: Cosign Not Required Encounter Date: 01/01/2018  Problem: Right lower lobe lung mass  Editor: Tanda Rockers, MD (Physician)  Prior Versions: 1. Tanda Rockers, MD (Physician) at 01/04/2018 7:24 AM - Written    12/31/17 HRCT Chest 1. Masslike consolidation in the right lower lobe measuring 7.3 x 6.5 cm with associated volume loss and distortion of the right major fissure. There has been persistent consolidation in this location on multiple recent chest radiographs back to 05/22/2017. In addition, there was a morphologically suspicious irregular 2.7 cm pulmonary nodule in the anterior right lower lobe on the 11/08/2008 chest CT. While nonspecific, these findings are highly concerning for right lower lobe lung adenocarcinoma. 2. Numerous subsolid and solid pulmonary nodules in both lungs, worrisome for pulmonary metastases. 3.  Patchy interlobular septal thickening in both lungs, right greater than left, worrisome for lymphangitic spread of tumor. 4. New nonspecific right paratracheal, subcarinal and bilateral hilar lymphadenopathy. Nodal metastases not excluded.  Had remote pericardial window in 2002 and I had assumed some of the changes in the bases  medially were related to this but CT is more c/w lung ca, probably adeno with widespread mets and first step is PET to determine easiest access for bx  Discussed in detail all the  indications, usual  risks and alternatives  relative to the benefits with patient who agrees to proceed with w/u as outlined.     I had an extended discussion with the patient reviewing all relevant studies completed to date and  lasting 15 to 20 minutes of a 25 minute visit    Each maintenance medication was reviewed in detail including most importantly the difference between maintenance and prns and under what circumstances the prns are to be triggered using an action plan format that is not reflected in the computer generated alphabetically organized AVS.    Please see AVS for specific instructions unique to this visit that I personally wrote and verbalized to the the pt in detail and then reviewed with pt  by my nurse highlighting any  changes in therapy recommended at today's visit to their plan of care.            Patient Instructions by Tanda Rockers, MD at 01/01/2018 2:00 PM   Author: Tanda Rockers, MD Author Type: Physician Filed: 01/01/2018 2:26 PM  Note Status: Signed Cosign: Cosign Not Required Encounter Date: 01/01/2018  Editor: Tanda Rockers, MD (Physician)    Please see patient coordinator before you leave today  to schedule PET scan and I will you with the results        PET  01/09/18 Mass-like opacity in the right lower lobe measuring at least 6.0 cm, worrisome for primary bronchogenic neoplasm such as adenocarcinoma. Additional multifocal subsolid/ground-glass nodules, worrisome for metastases/multifocal adenocarcinoma.  Suspected mediastinal and bilateral hilar nodal metastases.   Discussed in detail all the  indications, usual  risks and alternatives  relative to the benefits with patient who agrees to proceed with bronchoscopy with biopsy.     01/21/2018 day of FOB/  note INR 1.7  01/20/18 and coumadin on hold / no change hx or exam   Christinia Gully, MD Pulmonary and [place] [phone] After 5:30 PM or weekends, use [facility] [phone]

## 2018-01-21 NOTE — Discharge Instructions (Signed)
Flexible Bronchoscopy, Care After These instructions give you information on caring for yourself after your procedure. Your doctor may also give you more specific instructions. Call your doctor if you have any problems or questions after your procedure. Follow these instructions at home:  Do not eat or drink anything for 2 hours after your procedure. If you try to eat or drink before the medicine wears off, food or drink could go into your lungs. You could also burn yourself.  After 2 hours have passed and when you can cough and gag normally, you may eat soft food and drink liquids slowly.  The day after the test, you may eat your normal diet.  You may do your normal activities.  Keep all doctor visits. Get help right away if:  You get more and more short of breath.  You get light-headed.  You feel like you are going to pass out (faint).  You have chest pain.  You have new problems that worry you.  You cough up more than a little blood.  You cough up more blood than before. This information is not intended to replace advice given to you by your health care provider. Make sure you discuss any questions you have with your health care provider. Document Released: 08/04/2009 Document Revised: 03/14/2016 Document Reviewed: 06/11/2013 Elsevier Interactive Patient Education  2017 [place] not eat or drink until after  11:45 am   [place]ady 01/21/18 Please call office at [phone] if you have any question or concerns today.

## 2018-01-22 ENCOUNTER — Encounter (HOSPITAL_COMMUNITY): Payer: Self-pay | Admitting: Internal Medicine

## 2018-01-22 ENCOUNTER — Telehealth: Payer: Self-pay | Admitting: Internal Medicine

## 2018-01-22 DIAGNOSIS — R918 Other nonspecific abnormal finding of lung field: Secondary | ICD-10-CM

## 2018-01-22 NOTE — Telephone Encounter (Signed)
Yes I told her niece yesterday: ok to restart unless active bleeding (fresh bright blood) and if she does note any bleeding back on it to hold it again until it resolves  I will call her also when all the results are back but this may be tomorrow afternoon

## 2018-01-22 NOTE — Telephone Encounter (Signed)
Called and spoke to pt. Informed her of the recs per MW. Pt verbalized understanding and denied any further questions or concerns at this time.   

## 2018-01-22 NOTE — Telephone Encounter (Signed)
ATC pt, no answer. Left message for pt to call back.  

## 2018-01-22 NOTE — Telephone Encounter (Signed)
Pt is returning call. Cb is [phone].

## 2018-01-22 NOTE — Telephone Encounter (Signed)
LVM for patient to return call regarding her questions about Coumadin. X1 Pt would like to know if she should start taking her Coumadin today.  Per Pt she has a bronch yesterday and it went well.   Routing message to MW for review.

## 2018-01-23 ENCOUNTER — Other Ambulatory Visit: Payer: Self-pay | Admitting: Emergency Medicine

## 2018-01-23 DIAGNOSIS — R918 Other nonspecific abnormal finding of lung field: Secondary | ICD-10-CM

## 2018-01-27 ENCOUNTER — Other Ambulatory Visit: Payer: Self-pay | Admitting: Emergency Medicine

## 2018-01-27 DIAGNOSIS — R918 Other nonspecific abnormal finding of lung field: Secondary | ICD-10-CM

## 2018-01-28 ENCOUNTER — Ambulatory Visit: Payer: Medicare Other | Admitting: Internal Medicine

## 2018-01-29 ENCOUNTER — Telehealth: Payer: Self-pay | Admitting: Pharmacist

## 2018-01-29 ENCOUNTER — Ambulatory Visit (INDEPENDENT_AMBULATORY_CARE_PROVIDER_SITE_OTHER): Payer: Medicare Other | Admitting: *Deleted

## 2018-01-29 DIAGNOSIS — Z7901 Long term (current) use of anticoagulants: Secondary | ICD-10-CM

## 2018-01-29 DIAGNOSIS — I480 Paroxysmal atrial fibrillation: Secondary | ICD-10-CM | POA: Diagnosis not present

## 2018-01-29 DIAGNOSIS — I4891 Unspecified atrial fibrillation: Secondary | ICD-10-CM | POA: Diagnosis not present

## 2018-01-29 LAB — POCT INR: INR: 1.6

## 2018-01-29 NOTE — Patient Instructions (Addendum)
Description   Today take 1 tablet since you took an extra 1/2 tablet yesterday, tomorrow take 1 tablet, then resume 1 tablet everyday except 1/2 tablet on Mondays, Wednesdays, and Fridays.Take your last dose on 02/05/18, then Resume Coumadin after your procedure per Dr Agustina Caroli instructions and resume your normal dosage.    Recheck INR in 2 weeks.   [phone] Coumadin Clinic

## 2018-01-29 NOTE — Telephone Encounter (Signed)
Pt in office for INR check and reports needs to hold warfarin for bronch on 02/11/18. Pt previously cleared to hold 5 days without lovenox. Pt on warfarin for afib with CHADS2VASc score of 5 (age x2, female, HTN, CHF). She is ok to hold Coumadin for 5 days prior.

## 2018-02-03 ENCOUNTER — Telehealth: Payer: Self-pay | Admitting: Internal Medicine

## 2018-02-03 NOTE — Telephone Encounter (Signed)
Called and spoke with Shelby Mattocks, she states that she is needing the pre op orders as well as the consent orders to be placed for the patients pre admission appointment on 4.18.19. Patient has scheduled bronchoscopy.   RB please advise on what orders need to be placed, thanks.

## 2018-02-04 ENCOUNTER — Other Ambulatory Visit: Payer: Self-pay | Admitting: Emergency Medicine

## 2018-02-04 NOTE — Telephone Encounter (Signed)
Done

## 2018-02-05 ENCOUNTER — Encounter (HOSPITAL_COMMUNITY): Payer: Self-pay

## 2018-02-05 ENCOUNTER — Ambulatory Visit (INDEPENDENT_AMBULATORY_CARE_PROVIDER_SITE_OTHER)
Admission: RE | Admit: 2018-02-05 | Discharge: 2018-02-05 | Disposition: A | Discharge status: Home / Self Care | Payer: Medicare Other | Source: Ambulatory Visit | Attending: Emergency Medicine | Admitting: Emergency Medicine

## 2018-02-05 ENCOUNTER — Encounter (HOSPITAL_COMMUNITY)
Admission: RE | Admit: 2018-02-05 | Discharge: 2018-02-05 | Disposition: A | Discharge status: Home / Self Care | Payer: Medicare Other | Source: Ambulatory Visit | Attending: Emergency Medicine | Admitting: Emergency Medicine

## 2018-02-05 ENCOUNTER — Other Ambulatory Visit: Payer: Self-pay

## 2018-02-05 DIAGNOSIS — Z01812 Encounter for preprocedural laboratory examination: Secondary | ICD-10-CM | POA: Diagnosis not present

## 2018-02-05 DIAGNOSIS — R918 Other nonspecific abnormal finding of lung field: Secondary | ICD-10-CM | POA: Diagnosis not present

## 2018-02-05 LAB — COMPREHENSIVE METABOLIC PANEL
ALBUMIN: 3.5 g/dL (ref 3.5–5.0)
ALK PHOS: 48 U/L (ref 38–126)
ALT: 24 U/L (ref 14–54)
AST: 23 U/L (ref 15–41)
Anion gap: 10 (ref 5–15)
BILIRUBIN TOTAL: 1 mg/dL (ref 0.3–1.2)
BUN: 8 mg/dL (ref 6–20)
CALCIUM: 9.1 mg/dL (ref 8.9–10.3)
CO2: 21 mmol/L — AB (ref 22–32)
CREATININE: 0.97 mg/dL (ref 0.44–1.00)
Chloride: 102 mmol/L (ref 101–111)
GFR calc Af Amer: >60 mL/min (ref >60)
GFR calc non Af Amer: 54 mL/min — ABNORMAL LOW (ref >60)
GLUCOSE: 90 mg/dL (ref 65–99)
Potassium: 3.6 mmol/L (ref 3.5–5.1)
SODIUM: 133 mmol/L — AB (ref 135–145)
TOTAL PROTEIN: 7.6 g/dL (ref 6.5–8.1)

## 2018-02-05 LAB — CBC
HEMATOCRIT: 42.6 % (ref 36.0–46.0)
Hemoglobin: 14.5 g/dL (ref 12.0–15.0)
MCH: 31.9 pg (ref 26.0–34.0)
MCHC: 34 g/dL (ref 30.0–36.0)
MCV: 93.8 fL (ref 78.0–100.0)
PLATELETS: 220 10*3/uL (ref 150–400)
RBC: 4.54 MIL/uL (ref 3.87–5.11)
RDW: 14.1 % (ref 11.5–15.5)
WBC: 8.7 10*3/uL (ref 4.0–10.5)

## 2018-02-05 LAB — APTT: APTT: 46 s — AB (ref 24–36)

## 2018-02-05 LAB — PROTIME-INR
INR: 2.38
Prothrombin Time: 25.8 seconds — ABNORMAL HIGH (ref 11.4–15.2)

## 2018-02-05 NOTE — Progress Notes (Addendum)
PCP: Cathlean Cower MD  Cardiologist: Dorris Carnes, MD  EKG: 05/22/17 in EPIC  Stress test: 06/10/17 in EPIC  ECHO: 04/17/06 in EPIC  Cardiac Cath: pt denies ever  Chest x-ray:11/19/17 in Epic  Pt has been told by her doctor to begin holding coumadin 02/07/18

## 2018-02-05 NOTE — Pre-Procedure Instructions (Addendum)
Monica Mora  02/05/2018      [id number] - [place], [place] - [address] [place] [address] Phone: [phone] Fax: [place], [place] [facility] [address] [place] [address] [place] [address] Phone: [phone] Fax: [place], [place] - [address] [place] [address] Phone: [phone] Fax: [phone]    Your procedure is scheduled on February 11, 2018.  Report to [facility] Admitting at 630 AM.  Call this number if you have problems the morning of surgery:  [phone]   Remember:  Do not eat food or drink liquids after midnight.  Take these medicines the morning of surgery with A SIP OF WATER  Metoprolol (Toprol XL) Albuterol inhaler-if needed (bring inhaler with you) Eye drops Levothyroxine (synthroid) Pantoprazole (protonix)  Follow your doctor's instructions on when to hold/resume coumadin (hold 5 days prior to procedure)  7 days prior to surgery STOP taking any Aspirin (unless otherwise instructed by your surgeon), Aleve, Naproxen, Ibuprofen, Motrin, Advil, Goody's, BC's, all herbal medications, fish oil, and all vitamins  Continue all other medications as instructed by your physician except follow the above medication instructions before surgery   Do not wear jewelry, make-up or nail polish.  Do not wear lotions, powders, or perfumes, or deodorant.  Do not shave 48 hours prior to surgery.    Do not bring valuables to the hospital.  [facility] is not responsible for any belongings or valuables.  Contacts, dentures or bridgework may not be worn into surgery.  Leave your suitcase in the car.  After surgery it may be brought to your room.  For patients admitted to the hospital, discharge time will be determined by your treatment team.  Patients discharged the day of surgery will not be allowed to  drive home.    [place]- Preparing For Surgery  Before surgery, you can play an important role. Because skin is not sterile, your skin needs to be as free of germs as possible. You can reduce the number of germs on your skin by washing with CHG (chlorahexidine gluconate) Soap before surgery.  CHG is an antiseptic cleaner which kills germs and bonds with the skin to continue killing germs even after washing.  Please do not use if you have an allergy to CHG or antibacterial soaps. If your skin becomes reddened/irritated stop using the CHG.  Do not shave (including legs and underarms) for at least 48 hours prior to first CHG shower. It is OK to shave your face.  Please follow these instructions carefully.   1. Shower the NIGHT BEFORE SURGERY and the MORNING OF SURGERY with CHG.   2. If you chose to wash your hair, wash your hair first as usual with your normal shampoo.  3. After you shampoo, rinse your hair and body thoroughly to remove the shampoo.  4. Use CHG as you would any other liquid soap. You can apply CHG directly to the skin and wash gently with a scrungie or a clean washcloth.   5. Apply the CHG Soap to your body ONLY FROM THE NECK DOWN.  Do not use on open wounds or open sores. Avoid contact with your eyes, ears, mouth and genitals (private parts). Wash Face and genitals (private parts)  with your normal soap.  6. Wash thoroughly, paying special attention to the area where your surgery will  be performed.  7. Thoroughly rinse your body with warm water from the neck down.  8. DO NOT shower/wash with your normal soap after using and rinsing off the CHG Soap.  9. Pat yourself dry with a CLEAN TOWEL.  10. Wear CLEAN PAJAMAS to bed the night before surgery, wear comfortable clothes the morning of surgery  11. Place CLEAN SHEETS on your bed the night of your first shower and DO NOT SLEEP WITH PETS.  Day of Surgery: Do not apply any deodorants/lotions. Please wear clean clothes  to the hospital/surgery center.     Please read over the following fact sheets that you were given. Pain Booklet and Coughing and Deep Breathing

## 2018-02-06 NOTE — Progress Notes (Signed)
Anesthesia Chart Review:  Case:  [id number] Date/Time:  02/11/18 0815   Procedures:      VIDEO BRONCHOSCOPY WITH ENDOBRONCHIAL NAVIGATION (Right )     VIDEO BRONCHOSCOPY WITH ENDOBRONCHIAL ULTRASOUND (Right )   Anesthesia type:  General   Pre-op diagnosis:  RIGHT LOWER LUNG MASS   Location:  [facility] OR ROOM 10 / [place] [facility]   Surgeon:  Collene Gobble, MD      DISCUSSION: Patient is an 83 year old female scheduled for the above procedure. 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 She underwent chest CT in 12/2017 for evaluation of dyspnea. A RLL lung mass was noted. She underwent video flexible fiberoptic bronchoscopy 01/21/18 that showed atypical glandular epithelium (differential includes atypical adenomatous hyperplasia or well differentiated adenocarcinoma). Now scheduled for the above procedures.    She is holding warfarin 5 days prior as instructed by [facility] PharmD. If repeat PT/PTT results acceptable and otherwise no acute changes then I would anticipate that she could proceed as planned.  VS:BP (!) 155/74   Pulse 66   Temp (!) 36.3 C   Resp 18   Ht 5' 6.5" (1.689 m)   Wt 207 lb 3.2 oz (94 kg)   SpO2 95%   BMI 32.94 kg/m   PROVIDERS: -Biagio Borg, MD as PCP - Dorris Carnes, MD as Cardiologist. Last visit 11/20/17. She was awaiting pulmonology evaluation/recommendations prior to proceeding with additional work-up for dyspnea. She had a normal stress test in 2018. Christinia Gully, MD as Pulmonolgoist  LABS: Preoperative labs reviewed. PT/PTT elevated. Will repeat on the day of surgery.  (all labs ordered are listed, but only abnormal results are displayed)  Labs Reviewed  APTT - Abnormal; Notable for the following components:      Result Value   aPTT 46 (*)    All other components within normal limits  COMPREHENSIVE METABOLIC PANEL - Abnormal; Notable for the following  components:   Sodium 133 (*)    CO2 21 (*)    GFR calc non Af Amer 54 (*)    All other components within normal limits  PROTIME-INR - Abnormal; Notable for the following components:   Prothrombin Time 25.8 (*)    All other components within normal limits  CBC    IMAGES: CT Super D Chest 02/05/18: IMPRESSION: 1. Similar size of area of masslike architectural distortion with surrounding atelectasis and volume loss involving the right lower lobe. Additional sub solid and ground-glass nodules appears similar to previous exam with the exception of the left apical nodule which is slightly smaller at 7 mm. 2. No significant change in the appearance of enlarged mediastinal nodes. 3. Aortic Atherosclerosis (ICD10-I70.0). Lad coronary artery calcifications noted.  1V CXR 01/21/18: IMPRESSION: No pneumothorax following right sided bronchoscopy and biopsy. Some worsening volume loss in the lower lungs right more than left.  Spirometry 01/01/18: Normal.  EKG: 05/22/17 SR, negative precordial T waves, probable normal.  Consider anterior septal ischemia. Except in V2, T wave changes appear more non-specific.  CV: Nuclear stress test 06/10/17:  The left ventricular ejection fraction is normal (55-65%).  Nuclear stress EF: 56%.  There was no ST segment deviation noted during stress.  The study is normal.  1. EF 56% with normal wall motion.  2. No significant perfusion defects, no evidence for ischemia or infarction.  Normal study.   Echo 04/17/06: SUMMARY - Overall left ventricular systolic function was normal. Left  ventricular ejection fraction was estimated to be 65 %. There    were no left ventricular regional wall motion abnormalities. - There was mild mitral valvular regurgitation. - The right atrium was mildly dilated. - There appeared to be a trivial pericardial effusion posterior to    the heart. - There is only mild MR. There is a trivial posterior  pericardial    effusion. IMPRESSIONS - There is only mild MR. There is a trivial posterior pericardial    effusion.  Past Medical History:  Diagnosis Date  . Abnormal chest x-ray   . Atrial fibrillation ([place])   . CHF (congestive heart failure) ([place])   . Constrictive pericarditis    s/p pericardial window in 2002 in [place]  . Glaucoma   . H/O: hysterectomy 1992  . Heart murmur   . Hyperlipidemia   . Nontoxic multinodular goiter   . Other voice and resonance disorders   . TB (tuberculosis) 2002   positive skin test, negative chest x-ray  . Unspecified essential hypertension   . Unspecified hypothyroidism     Past Surgical History:  Procedure Laterality Date  . ABDOMINAL HYSTERECTOMY  1992  . PERICARDIAL WINDOW  2002  . Radioactive Iodine for hyperthyroidism/Multinodular goiter     s/p  . VIDEO BRONCHOSCOPY Bilateral 01/21/2018   Procedure: VIDEO BRONCHOSCOPY WITH FLUORO;  Surgeon: Tanda Rockers, MD;  Location: [facility];  Service: Cardiopulmonary;  Laterality: Bilateral;    MEDICATIONS: . albuterol (PROAIR HFA) 108 (90 Base) MCG/ACT inhaler  . brimonidine (ALPHAGAN) 0.2 % ophthalmic solution  . Cholecalciferol (VITAMIN D) 1000 UNITS capsule  . dorzolamide-timolol (COSOPT) 22.3-6.8 MG/ML ophthalmic solution  . famotidine (PEPCID) 20 MG tablet  . hydrochlorothiazide (MICROZIDE) 12.5 MG capsule  . levothyroxine (SYNTHROID, LEVOTHROID) 75 MCG tablet  . pantoprazole (PROTONIX) 40 MG tablet  . rosuvastatin (CRESTOR) 10 MG tablet  . telmisartan (MICARDIS) 80 MG tablet  . TOPROL XL 50 MG 24 hr tablet  . Travoprost, BAK Free, (TRAVATAN) 0.004 % SOLN ophthalmic solution  . vitamin B-12 (CYANOCOBALAMIN) 500 MCG tablet  . warfarin (COUMADIN) 5 MG tablet   No current facility-administered medications for this encounter.    George Hugh [facility] Short Stay Center/Anesthesiology Phone [phone] 02/06/2018 3:22 PM

## 2018-02-09 ENCOUNTER — Telehealth: Payer: Self-pay | Admitting: Internal Medicine

## 2018-02-09 NOTE — Telephone Encounter (Signed)
Spoke with patient. She wanted to know about the procedure that she has scheduled on 4/24. Advised her that the respiratory department will reach out to her in regards to her instructions.   She verbalized understanding. Nothing else needed at time of call.

## 2018-02-11 ENCOUNTER — Ambulatory Visit (HOSPITAL_COMMUNITY): Payer: Medicare Other | Admitting: Vascular Surgery

## 2018-02-11 ENCOUNTER — Ambulatory Visit (HOSPITAL_COMMUNITY): Payer: Medicare Other

## 2018-02-11 ENCOUNTER — Encounter (HOSPITAL_COMMUNITY): Payer: Self-pay | Admitting: Anesthesiology

## 2018-02-11 ENCOUNTER — Ambulatory Visit (HOSPITAL_COMMUNITY)
Admission: RE | Admit: 2018-02-11 | Discharge: 2018-02-11 | Disposition: A | Discharge status: Home / Self Care | Payer: Medicare Other | Source: Ambulatory Visit | Attending: Emergency Medicine | Admitting: Emergency Medicine

## 2018-02-11 ENCOUNTER — Encounter (HOSPITAL_COMMUNITY)
Admission: RE | Disposition: A | Discharge status: Home / Self Care | Payer: Self-pay | Source: Ambulatory Visit | Attending: Emergency Medicine

## 2018-02-11 DIAGNOSIS — E039 Hypothyroidism, unspecified: Secondary | ICD-10-CM | POA: Insufficient documentation

## 2018-02-11 DIAGNOSIS — R590 Localized enlarged lymph nodes: Secondary | ICD-10-CM | POA: Diagnosis not present

## 2018-02-11 DIAGNOSIS — Z419 Encounter for procedure for purposes other than remedying health state, unspecified: Secondary | ICD-10-CM

## 2018-02-11 DIAGNOSIS — I480 Paroxysmal atrial fibrillation: Secondary | ICD-10-CM | POA: Diagnosis not present

## 2018-02-11 DIAGNOSIS — J189 Pneumonia, unspecified organism: Secondary | ICD-10-CM | POA: Diagnosis not present

## 2018-02-11 DIAGNOSIS — R918 Other nonspecific abnormal finding of lung field: Secondary | ICD-10-CM | POA: Insufficient documentation

## 2018-02-11 DIAGNOSIS — I10 Essential (primary) hypertension: Secondary | ICD-10-CM | POA: Diagnosis not present

## 2018-02-11 DIAGNOSIS — K219 Gastro-esophageal reflux disease without esophagitis: Secondary | ICD-10-CM | POA: Insufficient documentation

## 2018-02-11 DIAGNOSIS — R911 Solitary pulmonary nodule: Secondary | ICD-10-CM

## 2018-02-11 DIAGNOSIS — E785 Hyperlipidemia, unspecified: Secondary | ICD-10-CM | POA: Diagnosis not present

## 2018-02-11 DIAGNOSIS — Z87891 Personal history of nicotine dependence: Secondary | ICD-10-CM | POA: Insufficient documentation

## 2018-02-11 DIAGNOSIS — Z9889 Other specified postprocedural states: Secondary | ICD-10-CM

## 2018-02-11 DIAGNOSIS — Z79899 Other long term (current) drug therapy: Secondary | ICD-10-CM | POA: Insufficient documentation

## 2018-02-11 DIAGNOSIS — Z7901 Long term (current) use of anticoagulants: Secondary | ICD-10-CM | POA: Insufficient documentation

## 2018-02-11 DIAGNOSIS — Z79890 Hormone replacement therapy (postmenopausal): Secondary | ICD-10-CM | POA: Insufficient documentation

## 2018-02-11 DIAGNOSIS — J9811 Atelectasis: Secondary | ICD-10-CM | POA: Insufficient documentation

## 2018-02-11 HISTORY — PX: VIDEO BRONCHOSCOPY WITH ENDOBRONCHIAL NAVIGATION: SHX6175

## 2018-02-11 HISTORY — PX: VIDEO BRONCHOSCOPY WITH ENDOBRONCHIAL ULTRASOUND: SHX6177

## 2018-02-11 LAB — PROTIME-INR
INR: 1.25
PROTHROMBIN TIME: 15.6 s — AB (ref 11.4–15.2)

## 2018-02-11 LAB — BODY FLUID CELL COUNT WITH DIFFERENTIAL
EOS FL: 1 %
LYMPHS FL: 23 %
Monocyte-Macrophage-Serous Fluid: 53 % (ref 50–90)
NEUTROPHIL FLUID: 23 % (ref 0–25)
Total Nucleated Cell Count, Fluid: 506 cu mm (ref 0–1000)

## 2018-02-11 LAB — APTT: APTT: 32 s (ref 24–36)

## 2018-02-11 SURGERY — VIDEO BRONCHOSCOPY WITH ENDOBRONCHIAL NAVIGATION
Anesthesia: General | Laterality: Right

## 2018-02-11 MED ORDER — OXYCODONE HCL 5 MG PO TABS: 5.0000 mg | ORAL_TABLET | Freq: Once | ORAL | Status: DC | PRN

## 2018-02-11 MED ORDER — ROCURONIUM BROMIDE 100 MG/10ML IV SOLN
INTRAVENOUS | Status: DC | PRN
  Administered 2018-02-11: 10 mg via INTRAVENOUS
  Administered 2018-02-11: 50 mg via INTRAVENOUS

## 2018-02-11 MED ORDER — OXYCODONE HCL 5 MG/5ML PO SOLN: 5.0000 mg | Freq: Once | ORAL | Status: DC | PRN

## 2018-02-11 MED ORDER — EPHEDRINE SULFATE 50 MG/ML IJ SOLN
INTRAMUSCULAR | Status: DC | PRN
  Administered 2018-02-11 (×2): 10 mg via INTRAVENOUS

## 2018-02-11 MED ORDER — LACTATED RINGERS IV SOLN
INTRAVENOUS | Status: DC | PRN
  Administered 2018-02-11 (×2): via INTRAVENOUS

## 2018-02-11 MED ORDER — SUGAMMADEX SODIUM 200 MG/2ML IV SOLN
INTRAVENOUS | Status: DC | PRN
Start: 2018-02-11 — End: 2018-02-11
  Administered 2018-02-11: 380 mg via INTRAVENOUS

## 2018-02-11 MED ORDER — LACTATED RINGERS IV SOLN
INTRAVENOUS | Status: DC
  Administered 2018-02-11: 07:00:00 via INTRAVENOUS

## 2018-02-11 MED ORDER — FENTANYL CITRATE (PF) 250 MCG/5ML IJ SOLN
INTRAMUSCULAR | Status: AC
  Filled 2018-02-11: qty 5

## 2018-02-11 MED ORDER — PROPOFOL 10 MG/ML IV BOLUS
INTRAVENOUS | Status: AC
  Filled 2018-02-11: qty 20

## 2018-02-11 MED ORDER — ONDANSETRON HCL 4 MG/2ML IJ SOLN
INTRAMUSCULAR | Status: DC | PRN
  Administered 2018-02-11: 4 mg via INTRAVENOUS

## 2018-02-11 MED ORDER — METOPROLOL SUCCINATE ER 25 MG PO TB24
ORAL_TABLET | ORAL | Status: AC
  Administered 2018-02-11: 50 mg
  Filled 2018-02-11: qty 2

## 2018-02-11 MED ORDER — PROPOFOL 10 MG/ML IV BOLUS
INTRAVENOUS | Status: DC | PRN
  Administered 2018-02-11: 150 mg via INTRAVENOUS

## 2018-02-11 MED ORDER — 0.9 % SODIUM CHLORIDE (POUR BTL) OPTIME
TOPICAL | Status: DC | PRN
  Administered 2018-02-11: 1000 mL

## 2018-02-11 MED ORDER — LIDOCAINE HCL (CARDIAC) PF 100 MG/5ML IV SOSY
PREFILLED_SYRINGE | INTRAVENOUS | Status: DC | PRN
  Administered 2018-02-11: 40 mg via INTRAVENOUS

## 2018-02-11 MED ORDER — FENTANYL CITRATE (PF) 100 MCG/2ML IJ SOLN
INTRAMUSCULAR | Status: DC | PRN
  Administered 2018-02-11 (×3): 50 ug via INTRAVENOUS

## 2018-02-11 MED ORDER — ONDANSETRON HCL 4 MG/2ML IJ SOLN: 4.0000 mg | Freq: Once | INTRAMUSCULAR | Status: DC | PRN

## 2018-02-11 MED ORDER — FENTANYL CITRATE (PF) 100 MCG/2ML IJ SOLN: 25.0000 ug | INTRAMUSCULAR | Status: DC | PRN

## 2018-02-11 MED ORDER — PHENYLEPHRINE HCL 10 MG/ML IJ SOLN
INTRAVENOUS | Status: DC | PRN
  Administered 2018-02-11: 50 ug/min via INTRAVENOUS

## 2018-02-11 MED ORDER — EPINEPHRINE PF 1 MG/ML IJ SOLN
INTRAMUSCULAR | Status: AC
  Filled 2018-02-11: qty 1

## 2018-02-11 SURGICAL SUPPLY — 47 items
ADAPTER BRONCH F/PENTAX (ADAPTER) ×3 IMPLANT
ADPR BSCP EDG PNTX (ADAPTER) ×1
BRUSH CYTOL CELLEBRITY 1.5X140 (MISCELLANEOUS) ×3 IMPLANT
BRUSH SUPERTRAX BIOPSY (INSTRUMENTS) IMPLANT
BRUSH SUPERTRAX NDL-TIP CYTO (INSTRUMENTS) ×3 IMPLANT
CANISTER SUCT 3000ML PPV (MISCELLANEOUS) ×3 IMPLANT
CHANNEL WORK EXTEND EDGE 180 (KITS) IMPLANT
CHANNEL WORK EXTEND EDGE 45 (KITS) IMPLANT
CHANNEL WORK EXTEND EDGE 90 (KITS) IMPLANT
CONT SPEC 4OZ CLIKSEAL STRL BL (MISCELLANEOUS) ×3 IMPLANT
COVER BACK TABLE 60X90IN (DRAPES) ×3 IMPLANT
COVER DOME SNAP 22 D (MISCELLANEOUS) ×3 IMPLANT
FILTER STRAW FLUID ASPIR (MISCELLANEOUS) IMPLANT
FORCEPS BIOP RJ4 1.8 (CUTTING FORCEPS) IMPLANT
FORCEPS BIOP SUPERTRX PREMAR (INSTRUMENTS) ×3 IMPLANT
GAUZE SPONGE 4X4 12PLY STRL (GAUZE/BANDAGES/DRESSINGS) ×3 IMPLANT
GLOVE BIO SURGEON STRL SZ7.5 (GLOVE) ×8 IMPLANT
GOWN STRL REUS W/ TWL LRG LVL3 (GOWN DISPOSABLE) ×2 IMPLANT
GOWN STRL REUS W/TWL LRG LVL3 (GOWN DISPOSABLE) ×6
KIT CLEAN ENDO COMPLIANCE (KITS) ×6 IMPLANT
KIT LOCATABLE GUIDE (CANNULA) IMPLANT
KIT MARKER FIDUCIAL DELIVERY (KITS) IMPLANT
KIT PROCEDURE EDGE 180 (KITS) IMPLANT
KIT PROCEDURE EDGE 45 (KITS) IMPLANT
KIT PROCEDURE EDGE 90 (KITS) IMPLANT
KIT TURNOVER KIT B (KITS) ×3 IMPLANT
MARKER SKIN DUAL TIP RULER LAB (MISCELLANEOUS) ×3 IMPLANT
NDL EBUS SONO TIP PENTAX (NEEDLE) ×1 IMPLANT
NDL SUPERTRX PREMARK BIOPSY (NEEDLE) ×1 IMPLANT
NEEDLE EBUS SONO TIP PENTAX (NEEDLE) ×3 IMPLANT
NEEDLE SUPERTRX PREMARK BIOPSY (NEEDLE) ×3 IMPLANT
NS IRRIG 1000ML POUR BTL (IV SOLUTION) ×3 IMPLANT
OIL SILICONE PENTAX (PARTS (SERVICE/REPAIRS)) ×3 IMPLANT
PAD ARMBOARD 7.5X6 YLW CONV (MISCELLANEOUS) ×6 IMPLANT
PATCHES PATIENT (LABEL) ×9 IMPLANT
SYR 20CC LL (SYRINGE) ×6 IMPLANT
SYR 20ML ECCENTRIC (SYRINGE) ×6 IMPLANT
SYR 50ML SLIP (SYRINGE) ×3 IMPLANT
SYR 5ML LUER SLIP (SYRINGE) ×3 IMPLANT
SYRINGE 60CC LL (MISCELLANEOUS) ×2 IMPLANT
TOWEL OR 17X24 6PK STRL BLUE (TOWEL DISPOSABLE) ×3 IMPLANT
TRAP SPECIMEN MUCOUS 40CC (MISCELLANEOUS) IMPLANT
TUBE CONNECTING 20'X1/4 (TUBING) ×2
TUBE CONNECTING 20X1/4 (TUBING) ×4 IMPLANT
UNDERPAD 30X30 (UNDERPADS AND DIAPERS) ×3 IMPLANT
VALVE DISPOSABLE (MISCELLANEOUS) ×3 IMPLANT
WATER STERILE IRR 1000ML POUR (IV SOLUTION) ×3 IMPLANT

## 2018-02-11 NOTE — Transfer of Care (Signed)
Immediate Anesthesia Transfer of Care Note  Patient: Monica Mora  Procedure(s) Performed: VIDEO BRONCHOSCOPY WITH ENDOBRONCHIAL NAVIGATION (Right ) VIDEO BRONCHOSCOPY WITH ENDOBRONCHIAL ULTRASOUND (Right )  Patient Location: PACU  Anesthesia Type:General  Level of Consciousness: awake, alert  and oriented  Airway & Oxygen Therapy: Patient Spontanous Breathing and Patient connected to face mask oxygen  Post-op Assessment: Report given to RN, Post -op Vital signs reviewed and stable and Patient moving all extremities X 4  Post vital signs: Reviewed and stable  Last Vitals:  Vitals Value Taken Time  BP 123/53 02/11/2018 10:53 AM  Temp 36.7 C 02/11/2018 10:53 AM  Pulse 79 02/11/2018 10:53 AM  Resp 23 02/11/2018 10:53 AM  SpO2 87 % 02/11/2018 10:53 AM  Vitals shown include unvalidated device data.  Last Pain:  Vitals:   02/11/18 0645  TempSrc: Oral         Complications: No apparent anesthesia complications

## 2018-02-11 NOTE — Anesthesia Procedure Notes (Signed)
Procedure Name: Intubation Date/Time: 02/11/2018 8:55 AM Performed by: Neldon Newport, CRNA Pre-anesthesia Checklist: Timeout performed, Patient being monitored, Patient identified, Suction available and Emergency Drugs available Patient Re-evaluated:Patient Re-evaluated prior to induction Oxygen Delivery Method: Circle system utilized Preoxygenation: Pre-oxygenation with 100% oxygen Induction Type: IV induction Ventilation: Mask ventilation without difficulty Laryngoscope Size: Mac and 3 Grade View: Grade I Tube type: Oral Tube size: 8.5 mm Number of attempts: 1 Placement Confirmation: breath sounds checked- equal and bilateral,  positive ETCO2 and ETT inserted through vocal cords under direct vision Secured at: 22 cm Tube secured with: Tape Dental Injury: Teeth and Oropharynx as per pre-operative assessment

## 2018-02-11 NOTE — Op Note (Signed)
Video Bronchoscopy with Electromagnetic Navigation and Endobronchial Ultrasound Procedure Note  Date of Operation: 02/11/2018  Pre-op Diagnosis: Right lower lobe mass/atelectasis, mediastinal lymphadenopathy  Post-op Diagnosis: Same  Surgeon: Baltazar Apo  Assistants: None  Anesthesia: General endotracheal anesthesia  Operation: Flexible video fiberoptic bronchoscopy with electromagnetic navigation and biopsies.  Estimated Blood Loss: Minimal  Complications: None apparent  Indications and History: Monica Mora is a 83 y.o. female with history of tobacco use.  She was noted to have a right lower lobe opacity with a masslike appearance with some associated atelectasis.  Also with mediastinal lymphadenopathy by CT scan.  Recommendation was made to achieve a tissue diagnosis via endobronchial ultrasound and navigational bronchoscopy.  The risks, benefits, complications, treatment options and expected outcomes were discussed with the patient.  The possibilities of pneumothorax, pneumonia, reaction to medication, pulmonary aspiration, perforation of a viscus, bleeding, failure to diagnose a condition and creating a complication requiring transfusion or operation were discussed with the patient who freely signed the consent.    Description of Procedure: The patient was seen in the Preoperative Area, was examined and was deemed appropriate to proceed.  The patient was taken to OR 10, identified as [place] and the procedure verified as Flexible Video Fiberoptic Bronchoscopy.  A Time Out was held and the above information confirmed.   The video fiberoptic bronchoscope was introduced via the endotracheal tube and a general inspection was performed which showed normal airways throughout.  No any bronchial lesions, no abnormal secretions. The standard scope was then withdrawn and the endobronchial ultrasound was used to identify and characterize the peritracheal, hilar and bronchial lymph  nodes. Inspection showed significant enlargement of nodes at 11 L, 7, 11 R.  The 4L node was also slightly enlarged. Using real-time ultrasound guidance Wang needle biopsies were take from Station 11 mL and also 7 in 2 distinct locations nodes and were sent for cytology.   Prior to the date of the procedure a high-resolution CT scan of the chest was performed. Utilizing [place] a virtual tracheobronchial tree was generated to allow the creation of distinct navigation pathways to the patient's right lower lobe parenchymal abnormality.  The extendable working channel and locator guide were introduced into the bronchoscope. The distinct navigation pathways prepared prior to this procedure were then utilized to navigate to within 0.5 cm of patient's lesion identified on CT scan. The extendable working channel was secured into place and the locator guide was withdrawn. Under fluoroscopic guidance transbronchial needle brushings and transbronchial forceps biopsies were performed to be sent for cytology and pathology. A bronchioalveolar lavage was performed in the right lower lobe superior segment and sent for cytology and microbiology (bacterial, fungal, AFB smears and cultures). At the end of the procedure a general airway inspection was performed and there was no evidence of active bleeding. The bronchoscope was removed.  The patient tolerated the procedure well. There was no significant blood loss and there were no obvious complications. A post-procedural chest x-ray is pending.  Samples: 1. Transbronchial needle brushings from right lower lobe superior segment 2. Transbronchial forceps biopsies from right lower lobe superior segment 3. Bronchoalveolar lavage from right lower lobe superior segment 4. Wang needle biopsies from 11 L node 5. Wang needle biopsies from 7 node   Plans:   The patient will be discharged from the PACU to home when recovered from anesthesia and after chest x-ray is  reviewed. We will review the cytology, pathology and microbiology results with the patient  when they become available. Outpatient followup will be with Dr Melvyn Novas.    Baltazar Apo, MD, PhD 02/11/2018, 10:45 AM [facility] [phone] or if no answer [phone]

## 2018-02-11 NOTE — Progress Notes (Signed)
Reported by CRNA that patients baseline on room air was 85-88% o2 saturation in short stay. Patient states she does not feel short of breath and uses an inhaler at home as needed. Sees Doctor Ali Lowe regularly and patients niece states she will speak with him about low oxygen saturations. Dr Lamonte Sakai also aware with that plan and spoke with the patient about the possibility of home o2.

## 2018-02-11 NOTE — Progress Notes (Signed)
Called Dr Linna Caprice for sign out. Patient to discharge with baseline O2 sats still running 86-90% room air.

## 2018-02-11 NOTE — Anesthesia Postprocedure Evaluation (Signed)
Anesthesia Post Note  Patient: Monica Mora  Procedure(s) Performed: VIDEO BRONCHOSCOPY WITH ENDOBRONCHIAL NAVIGATION (Right ) VIDEO BRONCHOSCOPY WITH ENDOBRONCHIAL ULTRASOUND (Right )     Patient location during evaluation: PACU Anesthesia Type: General Level of consciousness: awake and alert Pain management: pain level controlled Vital Signs Assessment: post-procedure vital signs reviewed and stable Respiratory status: spontaneous breathing, nonlabored ventilation, respiratory function stable and patient connected to nasal cannula oxygen Cardiovascular status: blood pressure returned to baseline and stable Postop Assessment: no apparent nausea or vomiting Anesthetic complications: no    Last Vitals:  Vitals:   02/11/18 1122 02/11/18 1146  BP: 110/60 104/61  Pulse: 70 75  Resp: 15 14  Temp: 36.8 C 36.8 C  SpO2: (!) 88% (!) 89%    Last Pain:  Vitals:   02/11/18 1115  TempSrc:   PainSc: 0-No pain                 Olman Yono COKER

## 2018-02-11 NOTE — Interval H&P Note (Signed)
PCCM INterval Note   83 year old former smoker, history of atrial fibrillation, remote pericardial window for constrictive pericarditis.  Under evaluation by Dr. Melvyn Novas for right lower lobe consolidation/mass, some associated bilateral peripheral nodules.  Initial bronchoscopy under conscious sedation was nondiagnostic.  Based on suspicion we have decided to perform a navigational bronchoscopy under general anesthesia.  Patient presents today in stable condition, no new problems identified.  She does have some cough and some residual exertional dyspnea.  Discussed the procedure in detail with her today  Vitals:   02/11/18 0645  BP: (!) 169/76  Pulse: 75  Resp: 20  Temp: 98.7 F (37.1 C)  TempSrc: Oral  SpO2: 98%   Gen: Pleasant, well-nourished, in no distress,  normal affect  ENT: No lesions,  mouth clear,  oropharynx clear, no postnasal drip  Neck: No JVD, no stridor  Lungs: No use of accessory muscles, clear without rales or rhonchi  Cardiovascular: RRR, heart sounds normal, no murmur or gallops, no peripheral edema  Musculoskeletal: No deformities, no cyanosis or clubbing  Neuro: alert, non focal  Skin: Warm, no lesions or rashes   CT chest 02/05/18 >>  COMPARISON:  CT chest 12/31/2017  FINDINGS: Cardiovascular: Normal heart size. Aortic atherosclerosis. Calcification in the LAD coronary artery noted. No pericardial effusion.  Mediastinum/Nodes: Normal appearance of the thyroid gland. The trachea appears patent and is midline. Normal appearance of the esophagus. Mediastinal and hilar adenopathy is again noted. Right paratracheal node measures 1.3 cm, image 50/2. The index subcarinal lymph node measures 1.7 cm, image 70/2. Partially calcified left-sided pre-vascular lymph node measures 1 cm, image 46/2. No axillary or supraclavicular adenopathy.  Lungs/Pleura: No pleural effusion. Atelectasis and masslike architectural distortion involving the right lower lobe is  again identified. Unchanged appearance of right lower lobe mass with surrounding atelectasis measuring 5.6 x 5.6 cm, image 92/3. Spiculated nodule within the right upper lobe is unchanged measuring 1.1 cm, image 66/3. Posterior medial right upper lobe subpleural nodule is stable measuring 1.5 cm. Solid subpleural nodule in the left upper lobe is unchanged at 7 mm. 7 mm nodule in the left apex noted, image 21/3. Previously measured at 1 cm.  Upper Abdomen: No acute abnormality identified within the upper abdomen.  Musculoskeletal: No chest wall mass or suspicious bone lesions identified.  IMPRESSION: 1. Similar size of area of masslike architectural distortion with surrounding atelectasis and volume loss involving the right lower lobe. Additional sub solid and ground-glass nodules appears similar to previous exam with the exception of the left apical nodule which is slightly smaller at 7 mm. 2. No significant change in the appearance of enlarged mediastinal nodes. 3. Aortic Atherosclerosis (ICD10-I70.0). Lad coronary artery calcifications noted.  Plans: Navigational bronchoscopy today for nodule biopsies. Discussed with pt in full, all questions answered.  No barriers identified.   Baltazar Apo, MD, PhD 02/11/2018, 8:44 AM [facility] [phone] or if no answer [phone]

## 2018-02-11 NOTE — Discharge Instructions (Signed)
Flexible Bronchoscopy, Care After These instructions give you information on caring for yourself after your procedure. Your doctor may also give you more specific instructions. Call your doctor if you have any problems or questions after your procedure. Follow these instructions at home:  Do not eat or drink anything for 2 hours after your procedure. If you try to eat or drink before the medicine wears off, food or drink could go into your lungs. You could also burn yourself.  After 2 hours have passed and when you can cough and gag normally, you may eat soft food and drink liquids slowly.  The day after the test, you may eat your normal diet.  You may do your normal activities.  Keep all doctor visits. Get help right away if:  You get more and more short of breath.  You get light-headed.  You feel like you are going to pass out (faint).  You have chest pain.  You have new problems that worry you.  You cough up more than a little blood.  You cough up more blood than before.  Please call our office for any questions or concerns.  [phone].  This information is not intended to replace advice given to you by your health care provider. Make sure you discuss any questions you have with your health care provider. Document Released: 08/04/2009 Document Revised: 03/14/2016 Document Reviewed: 06/11/2013 Elsevier Interactive Patient Education  2017 [employer].

## 2018-02-11 NOTE — Anesthesia Preprocedure Evaluation (Addendum)
Anesthesia Evaluation  Patient identified by MRN, date of birth, ID band Patient awake    Reviewed: Allergy & Precautions, NPO status , Patient's Chart, lab work & pertinent test results  Airway Mallampati: II  TM Distance: >3 FB Neck ROM: Full    Dental  (+) Dental Advisory Given, Edentulous Upper   Pulmonary former smoker,    breath sounds clear to auscultation       Cardiovascular hypertension,  Rhythm:Regular Rate:Normal     Neuro/Psych    GI/Hepatic GERD  Medicated and Controlled,  Endo/Other  Hypothyroidism   Renal/GU      Musculoskeletal   Abdominal   Peds  Hematology   Anesthesia Other Findings SaO2 85-92%  Reproductive/Obstetrics                           Anesthesia Physical Anesthesia Plan  ASA: III  Anesthesia Plan: General   Post-op Pain Management:    Induction: Intravenous  PONV Risk Score and Plan: Ondansetron and Dexamethasone  Airway Management Planned: Oral ETT  Additional Equipment:   Intra-op Plan:   Post-operative Plan:   Informed Consent: I have reviewed the patients History and Physical, chart, labs and discussed the procedure including the risks, benefits and alternatives for the proposed anesthesia with the patient or authorized representative who has indicated his/her understanding and acceptance.   Dental advisory given and Dental Advisory Given  Plan Discussed with: CRNA and Anesthesiologist  Anesthesia Plan Comments:        Anesthesia Quick Evaluation

## 2018-02-12 ENCOUNTER — Encounter (HOSPITAL_COMMUNITY): Payer: Self-pay | Admitting: Emergency Medicine

## 2018-02-12 ENCOUNTER — Telehealth: Payer: Self-pay | Admitting: Internal Medicine

## 2018-02-12 NOTE — Telephone Encounter (Signed)
Pt states that a family member bought her a pulse oximeter after her ENB yesterday- states that she has been checking her O2 since the procedure and it has read as low as 70%.  Pt does note stable sob with exertion, increased fatigue and weakness since procedure.  Pt does not wear supplemental O2.  Pt checked O2 when on the phone with me, O2 was at 85%, then rose to 90%.    Pt is seeing MW tomorrow morning at 8:45 for further evaluation.  Nothing further needed at this time.

## 2018-02-13 ENCOUNTER — Ambulatory Visit: Payer: Medicare Other | Admitting: Internal Medicine

## 2018-02-13 ENCOUNTER — Other Ambulatory Visit (INDEPENDENT_AMBULATORY_CARE_PROVIDER_SITE_OTHER): Payer: Medicare Other

## 2018-02-13 ENCOUNTER — Telehealth: Payer: Self-pay | Admitting: Internal Medicine

## 2018-02-13 ENCOUNTER — Ambulatory Visit (INDEPENDENT_AMBULATORY_CARE_PROVIDER_SITE_OTHER)
Admission: RE | Admit: 2018-02-13 | Discharge: 2018-02-13 | Disposition: A | Discharge status: Home / Self Care | Payer: Medicare Other | Source: Ambulatory Visit | Attending: Internal Medicine | Admitting: Internal Medicine

## 2018-02-13 VITALS — BP 108/60 | HR 82 | Ht 66.5 in | Wt 214.4 lb

## 2018-02-13 DIAGNOSIS — R911 Solitary pulmonary nodule: Secondary | ICD-10-CM | POA: Diagnosis not present

## 2018-02-13 DIAGNOSIS — R0609 Other forms of dyspnea: Secondary | ICD-10-CM

## 2018-02-13 DIAGNOSIS — R918 Other nonspecific abnormal finding of lung field: Secondary | ICD-10-CM | POA: Diagnosis not present

## 2018-02-13 DIAGNOSIS — R0602 Shortness of breath: Secondary | ICD-10-CM | POA: Diagnosis not present

## 2018-02-13 LAB — CBC WITH DIFFERENTIAL/PLATELET
Basophils Absolute: 0.1 10*3/uL (ref 0.0–0.1)
Basophils Relative: 0.5 % (ref 0.0–3.0)
EOS PCT: 1.3 % (ref 0.0–5.0)
Eosinophils Absolute: 0.2 10*3/uL (ref 0.0–0.7)
HCT: 42.7 % (ref 36.0–46.0)
Hemoglobin: 14.5 g/dL (ref 12.0–15.0)
LYMPHS ABS: 2.7 10*3/uL (ref 0.7–4.0)
Lymphocytes Relative: 22.2 % (ref 12.0–46.0)
MCHC: 33.9 g/dL (ref 30.0–36.0)
MCV: 95 fl (ref 78.0–100.0)
MONO ABS: 1 10*3/uL (ref 0.1–1.0)
MONOS PCT: 8.5 % (ref 3.0–12.0)
NEUTROS ABS: 8.1 10*3/uL — AB (ref 1.4–7.7)
NEUTROS PCT: 67.5 % (ref 43.0–77.0)
PLATELETS: 199 10*3/uL (ref 150.0–400.0)
RBC: 4.5 Mil/uL (ref 3.87–5.11)
RDW: 14.9 % (ref 11.5–15.5)
WBC: 12.1 10*3/uL — ABNORMAL HIGH (ref 4.0–10.5)

## 2018-02-13 LAB — CULTURE, RESPIRATORY

## 2018-02-13 LAB — CULTURE, RESPIRATORY W GRAM STAIN
Culture: 20000 — AB
Gram Stain: NONE SEEN

## 2018-02-13 LAB — BASIC METABOLIC PANEL
BUN: 18 mg/dL (ref 6–23)
CO2: 23 mEq/L (ref 19–32)
CREATININE: 1.04 mg/dL (ref 0.40–1.20)
Calcium: 9.2 mg/dL (ref 8.4–10.5)
Chloride: 97 mEq/L (ref 96–112)
GFR: 65.49 mL/min (ref >60.00)
Glucose, Bld: 91 mg/dL (ref 70–99)
POTASSIUM: 3.4 meq/L — AB (ref 3.5–5.1)
Sodium: 131 mEq/L — ABNORMAL LOW (ref 135–145)

## 2018-02-13 LAB — BRAIN NATRIURETIC PEPTIDE: PRO B NATRI PEPTIDE: 488 pg/mL — AB (ref 0.0–100.0)

## 2018-02-13 LAB — SEDIMENTATION RATE: Sed Rate: 30 mm/hr (ref 0–30)

## 2018-02-13 MED ORDER — PREDNISONE 10 MG PO TABS: ORAL_TABLET | ORAL | 0 refills | Status: DC

## 2018-02-13 NOTE — Progress Notes (Signed)
LMTCB

## 2018-02-13 NOTE — Progress Notes (Signed)
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 Pulmonary Consult    Referred by Dr. Cathlean Mora.  Pt c/o SOB for the past year. She gets winded walking "a distance" and walking up stairs. She uses her albuterol inhaler 1-2 x per day on average. She has occ PND and non prod cough.     1st noted spring 2017 trip to [place] more doe and then steps at home more difficult x  6 months and variable has to stop at top  Also Has to walk slower when shopping  Notes sometime discomfort episgastric with exertion only not related to eating x one year Sleep 2 pillows  Started needing albuterol one month prior to OV  Cough started  One year prior to [place]   "it's due to drainage" but note it is dry and only daytime Treated for "pna" in Dec 2018 but never had fever/ purulent sputum or any acute illness to correlate with "pna on cxr" by radiology and no better p rx as cap Hoarseness, sore throat= scratchy/pnds, dysphagia, dental problems, itching, sneezing,  nasal congestion o  rec  Pantoprazole (protonix) 40 mg   Take  30-60 min before first meal of the day and Pepcid (famotidine)  20 mg one @  bedtime until return to office - this is the best way to tell whether stomach acid is contributing to your problem.   GERD  stop lisinopril  And start telmisartan 80  mg one daily   Please schedule a follow up office visit in 4 weeks, sooner if needed  with all medications /inhalers/ solutions in hand so we can verify exactly what you  are taking. This includes all medications from all doctors and over the counters - add 6 m walk on return    12/17/2017  f/u ov/Monica Mora re: chronic doe/ cough ? acei  Chief Complaint  Patient presents with  . Follow-up    6MW test done today. Breathing is unchanged. Her cough is gone. She uses albuterol inhaler once per wk on average.   Dyspnea:  Sometimes on steps / if goes slow does fine =  MMRC1 = can walk nl pace, flat grade, can't hurry or go uphills or steps s sob   Cough: denies  Sleep: 2 pillows  SABA use:  Rare rec No change in meds HRCT chest  12/31/16> c/w lung ca    01/01/2018  f/u ov/Monica Mora re:  Lung mass/ doe  Chief Complaint  Patient presents with  . Follow-up    Per pt, follow up after CT scan. Breathing has been about the same since last visit.   Dyspnea:  MMRC2 = can't walk a nl pace on a flat grade s sob but does fine slow and flat    Cough: gone since stopped lisinopril  Sleep: 2 pillows  SABA use:  Not using now  rec PET c/w ca > neg fob 01/21/18   Repeat FOB 02/11/18  Dr Monica Mora  >  Neg ebus/ neg TBBx     02/13/2018  f/u ov/Monica Mora re: doe/ pulmonary infiltrates  Chief Complaint  Patient presents with  . Follow-up    Breathing progressively worse. She is using her proair at least 4 x per day. She also c/o non prod cough.   Dyspnea:  50 ft  Cough: more since procedure but dry   Sleep: ok  SABA use: reports does help for up to 2 hours     No obvious day to day or daytime variability or assoc excess/ purulent sputum or mucus plugs or hemoptysis or cp or chest tightness, subjective wheeze or overt sinus or hb symptoms. No unusual exposure hx or h/o childhood pna/ asthma or knowledge of premature birth.  Sleeping  ok  without nocturnal  or early am exacerbation  of respiratory  c/o's or need for noct saba. Also denies any obvious fluctuation of symptoms with weather or environmental changes or other aggravating or alleviating factors except as outlined above    Current Allergies, Complete Past Medical History, Past Surgical History, Family History, and Social History were reviewed in [employer] record.  ROS  The following are not active complaints unless bolded Hoarseness, sore throat, dysphagia, dental problems, itching, sneezing,  nasal congestion or discharge of excess mucus or purulent secretions, ear ache,   fever, chills, sweats, unintended wt loss or wt gain, classically pleuritic or exertional cp,  orthopnea pnd or arm/hand swelling  or leg swelling, presyncope, palpitations, abdominal pain, anorexia, nausea, vomiting, diarrhea  or change in bowel habits or change in bladder habits, change in stools or change in urine, dysuria, hematuria,  rash, arthralgias, visual complaints, headache, numbness, weakness or ataxia or problems with walking or coordination,  change in mood or  memory.        Current Meds  Medication Sig  . albuterol (PROAIR HFA) 108 (90 Base) MCG/ACT inhaler Inhale 2 puffs into the lungs every 6 (six) hours as needed for wheezing or shortness of breath.  . brimonidine (ALPHAGAN) 0.2 % ophthalmic solution Place 1 drop into the left eye 3 (three) times daily.  . Cholecalciferol (VITAMIN D) 1000 UNITS capsule Take 1,000 Units by mouth daily.    . dorzolamide-timolol (COSOPT) 22.3-6.8 MG/ML ophthalmic solution Place 1 drop into both eyes 2 (two) times daily.   . famotidine (PEPCID) 20 MG tablet One at bedtime (Patient taking differently: Take 20 mg by mouth at bedtime. )  . hydrochlorothiazide (MICROZIDE) 12.5 MG capsule TAKE ONE CAPSULE BY MOUTH EVERY DAY (Patient taking differently: TAKE 12.5 MG BY MOUTH EVERY DAY)  . levothyroxine (SYNTHROID, LEVOTHROID) 75 MCG tablet Take 1 tablet (75 mcg total) by mouth daily.  . pantoprazole (PROTONIX) 40 MG tablet Take 1 tablet (40 mg total) by mouth daily. Take 30-60 min before first meal of the day  . telmisartan (MICARDIS) 80 MG tablet Take 1 tablet (80 mg total) by  mouth daily.  . TOPROL XL 50 MG 24 hr tablet TAKE 1 TABLET BY MOUTH DAILY. TAKE WITH OR IMMEDIATELY FOLLOWING A MEAL (Patient taking differently: Take 50 mg by mouth daily. )  . Travoprost, BAK Free, (TRAVATAN) 0.004 % SOLN ophthalmic solution Place 1 drop into both eyes at bedtime.  . vitamin B-12 (CYANOCOBALAMIN) 500 MCG tablet Take 500 mcg by mouth daily.  Marland Kitchen warfarin (COUMADIN) 5 MG tablet Take as directed by coumadin clinic (Patient taking differently: Take 2.5-5 mg by mouth See admin instructions. Take 5 mg by  mouth daily on Sunday, Tuesday, Thursday and Saturday. Take 2.5 mg by mouth daily on all other days.)                Objective:   Physical Exam   02/13/2018       214  01/01/2018         207  12/17/2017       20 9   11/21/17 207 lb (93.9 kg)  11/20/17 207 lb 1.9 oz (93.9 kg)  11/19/17 210 lb (95.3 kg)      Vital signs reviewed - Note on arrival 02 sats  90% on RA          HEENT: nl   turbinates bilaterally, and oropharynx. Nl external ear canals without cough reflex- top dentures/ lower partials    NECK :  without JVD/Nodes/TM/ nl carotid upstrokes bilaterally   LUNGS: no acc muscle use,  Nl contour chest which is clear to A and P bilaterally without cough on insp or exp maneuvers   CV:  RRR  no s3 or murmur or increase in P2, and no edema   ABD:  soft and nontender with nl inspiratory excursion in the supine position. No bruits or organomegaly appreciated, bowel sounds nl  MS:  Nl gait/ ext warm without deformities, calf tenderness, cyanosis or clubbing No obvious joint restrictions   SKIN: warm and dry without lesions    NEURO:  alert, approp, nl sensorium with  no motor or cerebellar deficits apparent.       CXR PA and Lateral:   02/13/2018 :    I personally reviewed images and agree with radiology impression as follows:    Chronic bronchitic and interstitial changes with persistent RIGHT lower lobe atelectasis.  Labs ordered/ reviewed:       Chemistry      Component Value Date/Time   NA 131 (L) 02/13/2018 0929   NA 135 03/28/2017 1448   K 3.4 (L) 02/13/2018 0929   CL 97 02/13/2018 0929   CO2 23 02/13/2018 0929   BUN 18 02/13/2018 0929   BUN 7 (L) 03/28/2017 1448   CREATININE 1.04 02/13/2018 0929   CREATININE 0.71 05/16/2016 1616      Component Value Date/Time   CALCIUM 9.2 02/13/2018 0929   ALKPHOS 48 02/05/2018 1450   AST 23 02/05/2018 1450   ALT 24 02/05/2018 1450   BILITOT 1.0 02/05/2018 1450        Lab Results  Component Value Date   WBC 12.1 (H) 02/13/2018   HGB 14.5 02/13/2018   HCT 42.7 02/13/2018   MCV 95.0 02/13/2018   PLT 199.0 02/13/2018       EOS                                                               0.2                                   02/13/2018       Lab Results  Component Value Date   TSH 0.94 07/03/2017     Lab Results  Component Value Date   PROBNP 488.0 (H) 02/13/2018       Lab Results  Component Value  Date   ESRSEDRATE 30 02/13/2018   ESRSEDRATE 39 (H) 07/03/2010              Assessment:

## 2018-02-13 NOTE — Patient Instructions (Addendum)
Prednisone 10mg   X 4 each am with bfast until feeling better then reduce to 3 daily for a week then 2 daily   Please remember to go to the lab and x-ray department downstairs in the basement  for your tests - we will call you with the results when they are available.   Only use your albuterol as a rescue medication to be used if you can't catch your breath by resting or doing a relaxed purse lip breathing pattern.  - The less you use it, the better it will work when you need it. - Ok to use up to 2 puffs  every 4 hours if you must but call for immediate appointment if use goes up over your usual need - Don't leave home without it !!  (think of it like the spare tire for your car)   Work on inhaler technique:  relax and gently blow all the way out then take a nice smooth deep breath back in, triggering the inhaler at same time you start breathing in.  Hold for up to 5 seconds if you can. Blow out thru nose. Rinse and gargle with water when done      Please schedule a follow up office visit in 4 weeks, sooner if needed  - add  Lasix 20 mg daily / repeat echo

## 2018-02-13 NOTE — Telephone Encounter (Signed)
Called pharmacy to see why prednisone couldn't be dispensed.[facility], states that they can in fact fill this rx.  Spoke with pt to make aware.  Nothing further needed.

## 2018-02-14 LAB — RHEUMATOID FACTOR

## 2018-02-15 ENCOUNTER — Encounter: Payer: Self-pay | Admitting: Internal Medicine

## 2018-02-15 LAB — QUANTIFERON-TB GOLD PLUS
Mitogen-NIL: 9.09 IU/mL
NIL: 0.03 IU/mL
QUANTIFERON-TB GOLD PLUS: NEGATIVE
TB1-NIL: 0.01 IU/mL
TB2-NIL: 0.01 IU/mL

## 2018-02-15 NOTE — Assessment & Plan Note (Addendum)
-   Lexiscan neg 06/01/17  Neg  - PFT's  11/21/2017  FEV1 1.99 (108 % ) ratio 91  p no % improvement from saba p no prior to study with DLCO  29/28 % corrects to 45  % for alv volume   - 12/17/17 6 mw  312 m moderate pace, lowest sat was 90%  - Spirometry 01/01/2018  FEV1 1.76 (96%)  Ratio 89  - 02/13/2018  After extensive coaching inhaler device  effectiveness =    75%    Symptoms and 02 sats =  difficult to sort out respiratory symptoms of unknown origin for which  DDX  = almost all start with A and  include Adherence, Ace Inhibitors, Acid Reflux, Active Sinus Disease, Alpha 1 Antitripsin deficiency, Anxiety masquerading as Airways dz,  ABPA,  Allergy(esp in young), Aspiration (esp in elderly), Adverse effects of meds,  Active smokers, A bunch of PE's/clot burden (a few small clots can't cause this syndrome unless there is already severe underlying pulm or vascular dz with poor reserve),  Anemia or thyroid disorder, plus two Bs  = Bronchiectasis and Beta blocker use..and one C= CHF    Adherence is always the initial "prime suspect" and is a multilayered concern that requires a "trust but verify" approach in every patient - starting with knowing how to use medications, especially inhalers, correctly, keeping up with refills and understanding the fundamental difference between maintenance and prns vs those medications only taken for a very short course and then stopped and not refilled.  - see hfa teaching - return with all meds in hand using a trust but verify approach to confirm accurate Medication  Reconciliation The principal here is that until we are certain that the  patients are doing what we've asked, it makes no sense to ask them to do more.    ? Acid (or non-acid) GERD > always difficult to exclude as up to 75% of pts in some series report no assoc GI/ Heartburn symptoms> rec max (24h)  acid suppression and diet restrictions/ reviewed and instructions given in writing.   ? Allergy/ asthma/ BOOP >   Try pred 40 mg daily on a trial basis then taper to 20 mg daily   ? Adverse drug effects > none listed   ? A burnch of PE's > unlikely on coumadin  ? Beta blocker effects > unlikely on lopressor  ? Bronchiectasis > HRCT excluded 12/31/17   ? CHF >  bnp intermediate > rec lasix 20 mg daily and proceed with echo    I had an extended discussion with the patient reviewing all relevant studies completed to date and  lasting 25 minutes of a 40  minute office visit addressig severe non-specific but potentially very serious refractory respiratory symptoms of uncertain and potentially multiple  etiologies.   Each maintenance medication was reviewed in detail including most importantly the difference between maintenance and prns and under what circumstances the prns are to be triggered using an action plan format that is not reflected in the computer generated alphabetically organized AVS.    Please see AVS for specific instructions unique to this office visit that I personally wrote and verbalized to the the pt in detail and then reviewed with pt  by my nurse highlighting any changes in therapy/plan of care  recommended at today's visit.

## 2018-02-15 NOTE — Assessment & Plan Note (Signed)
12/31/17 HRCT Chest 1. Masslike consolidation in the right lower lobe measuring 7.3 x 6.5 cm with associated volume loss and distortion of the right major fissure. There has been persistent consolidation in this location on multiple recent chest radiographs back to 05/22/2017. In addition, there was a morphologically suspicious irregular 2.7 cm pulmonary nodule in the anterior right lower lobe on the 11/08/2008 chest CT. While nonspecific, these findings are highly concerning for right lower lobe lung adenocarcinoma. 2. Numerous subsolid and solid pulmonary nodules in both lungs, worrisome for pulmonary metastases. 3. Patchy interlobular septal thickening in both lungs, right greater than left, worrisome for lymphangitic spread of tumor. 4. New nonspecific right paratracheal, subcarinal and bilateral hilar lymphadenopathy. Nodal metastases not excluded. - FOB  01/21/18  > atypical cells lining bronchi c/w  - Repeat FOB 02/11/18 Dr Lamonte Sakai  >  Neg ebus/ neg TBBx   ? Could this be nodular boop in pt with prior hx of unexplained pericarditis > try prednisone 40 mg daily until better then taper to 20 mg daily / consider CT bx next if not impressive response.

## 2018-02-16 ENCOUNTER — Telehealth: Payer: Self-pay | Admitting: *Deleted

## 2018-02-16 ENCOUNTER — Other Ambulatory Visit: Payer: Self-pay | Admitting: Internal Medicine

## 2018-02-16 DIAGNOSIS — R0609 Other forms of dyspnea: Principal | ICD-10-CM

## 2018-02-16 LAB — CYCLIC CITRUL PEPTIDE ANTIBODY, IGG: Cyclic Citrullin Peptide Ab: <16 UNITS

## 2018-02-16 MED ORDER — FUROSEMIDE 20 MG PO TABS: 20.0000 mg | ORAL_TABLET | Freq: Every day | ORAL | 6 refills | Status: DC

## 2018-02-16 NOTE — Telephone Encounter (Signed)
Called and spoke with patient, advised her of MW response. Patient states that she is ok with having this done. Order has been placed. Nothing further needed.

## 2018-02-16 NOTE — Telephone Encounter (Signed)
LMTCB

## 2018-02-16 NOTE — Telephone Encounter (Signed)
Patient is returning call. CB number is [phone]

## 2018-02-16 NOTE — Telephone Encounter (Signed)
-----   Message from Tanda Rockers, MD sent at 02/15/2018  6:56 AM EDT ----- Needs 2 d echo next available dx doe

## 2018-02-17 LAB — ANTI-NUCLEAR AB-TITER (ANA TITER)

## 2018-02-17 LAB — ANA: ANA: POSITIVE — AB

## 2018-02-18 ENCOUNTER — Ambulatory Visit (HOSPITAL_COMMUNITY): Payer: Medicare Other | Attending: Internal Medicine

## 2018-02-18 ENCOUNTER — Telehealth: Payer: Self-pay | Admitting: Internal Medicine

## 2018-02-18 ENCOUNTER — Other Ambulatory Visit: Payer: Self-pay | Admitting: Internal Medicine

## 2018-02-18 ENCOUNTER — Other Ambulatory Visit: Payer: Self-pay

## 2018-02-18 ENCOUNTER — Ambulatory Visit (INDEPENDENT_AMBULATORY_CARE_PROVIDER_SITE_OTHER): Payer: Medicare Other | Admitting: *Deleted

## 2018-02-18 DIAGNOSIS — I509 Heart failure, unspecified: Secondary | ICD-10-CM | POA: Insufficient documentation

## 2018-02-18 DIAGNOSIS — R011 Cardiac murmur, unspecified: Secondary | ICD-10-CM | POA: Insufficient documentation

## 2018-02-18 DIAGNOSIS — Z7901 Long term (current) use of anticoagulants: Secondary | ICD-10-CM

## 2018-02-18 DIAGNOSIS — I110 Hypertensive heart disease with heart failure: Secondary | ICD-10-CM | POA: Diagnosis not present

## 2018-02-18 DIAGNOSIS — Z5181 Encounter for therapeutic drug level monitoring: Secondary | ICD-10-CM | POA: Diagnosis not present

## 2018-02-18 DIAGNOSIS — Z87891 Personal history of nicotine dependence: Secondary | ICD-10-CM | POA: Diagnosis not present

## 2018-02-18 DIAGNOSIS — R0609 Other forms of dyspnea: Secondary | ICD-10-CM | POA: Diagnosis not present

## 2018-02-18 DIAGNOSIS — E785 Hyperlipidemia, unspecified: Secondary | ICD-10-CM | POA: Diagnosis not present

## 2018-02-18 DIAGNOSIS — I4891 Unspecified atrial fibrillation: Secondary | ICD-10-CM | POA: Diagnosis not present

## 2018-02-18 DIAGNOSIS — I480 Paroxysmal atrial fibrillation: Secondary | ICD-10-CM | POA: Diagnosis not present

## 2018-02-18 DIAGNOSIS — I272 Pulmonary hypertension, unspecified: Secondary | ICD-10-CM | POA: Insufficient documentation

## 2018-02-18 LAB — POCT INR: INR: 1.9

## 2018-02-18 MED ORDER — PERFLUTREN LIPID MICROSPHERE
1.0000 mL | INTRAVENOUS | Status: AC | PRN
  Administered 2018-02-18: 2 mL via INTRAVENOUS

## 2018-02-18 MED ORDER — PANTOPRAZOLE SODIUM 40 MG PO TBEC
40.0000 mg | DELAYED_RELEASE_TABLET | Freq: Every day | ORAL | 2 refills | Status: DC
Start: 2018-02-18 — End: 2018-05-22

## 2018-02-18 NOTE — Patient Instructions (Addendum)
Description   Continue  1 tablet everyday except 1/2 tablet on Mondays, Wednesdays, and Fridays.. Pt started prednisone 10 mg tabs 4 on 02/14/2018 then continue 4 tabs for 1 week then reduced to 3 tabs for 1 week then 2 tabs daily until next office visit     Recheck INR in 1 week.   [phone] [facility]

## 2018-02-18 NOTE — Telephone Encounter (Signed)
Notes recorded by Tanda Rockers, MD on 02/18/2018 at 5:24 AM EDT Call patient :  Studies are strongly suggestive of lupus which may explain some of her problems  Needs to return this week for anti dna and recheck -can see Tammy NP if no avail then add on to me  Advised pt of results. Pt understood and nothing further is needed. Appt 5/16/19at 10:15

## 2018-02-19 ENCOUNTER — Other Ambulatory Visit: Payer: Self-pay | Admitting: *Deleted

## 2018-02-19 LAB — HYPERSENSITIVITY PNUEMONITIS PROFILE
ASPERGILLUS FUMIGATUS: NEGATIVE
FAENIA RETIVIRGULA: NEGATIVE
Pigeon Serum: NEGATIVE
S. VIRIDIS: NEGATIVE
T. CANDIDUS: NEGATIVE
T. VULGARIS: NEGATIVE

## 2018-02-20 ENCOUNTER — Telehealth: Payer: Self-pay | Admitting: Internal Medicine

## 2018-02-20 NOTE — Telephone Encounter (Signed)
Dr. Melvyn Novas, please advise. Thanks!

## 2018-02-20 NOTE — Telephone Encounter (Signed)
Discussed with pt, she is feeling better so rec no change recs and keep f/u 03/05/18

## 2018-02-23 ENCOUNTER — Telehealth: Payer: Self-pay | Admitting: Internal Medicine

## 2018-02-23 NOTE — Telephone Encounter (Signed)
Called and spoke to patient. Patient stated that she has refills but she wanted to make sure if she is supposed to keep taking the Pepcid and the Protonix. Reviewed her AVS/Dr. Gustavus Bryant notes and let the patient know that she is supposed to continue to take both medications. Patient verbalized understanding. Nothing further needed at this time.

## 2018-02-25 ENCOUNTER — Other Ambulatory Visit: Payer: Self-pay | Admitting: Internal Medicine

## 2018-02-26 ENCOUNTER — Ambulatory Visit (INDEPENDENT_AMBULATORY_CARE_PROVIDER_SITE_OTHER): Payer: Medicare Other | Admitting: *Deleted

## 2018-02-26 DIAGNOSIS — I4891 Unspecified atrial fibrillation: Secondary | ICD-10-CM

## 2018-02-26 DIAGNOSIS — I480 Paroxysmal atrial fibrillation: Secondary | ICD-10-CM | POA: Diagnosis not present

## 2018-02-26 DIAGNOSIS — Z7901 Long term (current) use of anticoagulants: Secondary | ICD-10-CM | POA: Diagnosis not present

## 2018-02-26 LAB — POCT INR: INR: 3.4

## 2018-02-26 NOTE — Patient Instructions (Signed)
Description   Skip tomorrow's dose, then resume 1 tablet daily except 1/2 tablet on Mondays, Wednesdays and Fridays.  Recheck INR in 2 weeks. Resume your normal green vegetable intake. Call us any medication changes or concerns to  [phone] [facility]

## 2018-03-05 ENCOUNTER — Ambulatory Visit (INDEPENDENT_AMBULATORY_CARE_PROVIDER_SITE_OTHER)
Admission: RE | Admit: 2018-03-05 | Discharge: 2018-03-05 | Disposition: A | Discharge status: Home / Self Care | Payer: Medicare Other | Source: Ambulatory Visit | Attending: Internal Medicine | Admitting: Internal Medicine

## 2018-03-05 ENCOUNTER — Other Ambulatory Visit (INDEPENDENT_AMBULATORY_CARE_PROVIDER_SITE_OTHER): Payer: Medicare Other

## 2018-03-05 ENCOUNTER — Encounter: Payer: Self-pay | Admitting: Internal Medicine

## 2018-03-05 ENCOUNTER — Ambulatory Visit: Payer: Medicare Other | Admitting: Internal Medicine

## 2018-03-05 VITALS — BP 130/80 | HR 72 | Ht 66.5 in | Wt 205.4 lb

## 2018-03-05 DIAGNOSIS — R918 Other nonspecific abnormal finding of lung field: Secondary | ICD-10-CM

## 2018-03-05 DIAGNOSIS — R0609 Other forms of dyspnea: Secondary | ICD-10-CM

## 2018-03-05 DIAGNOSIS — J9811 Atelectasis: Secondary | ICD-10-CM | POA: Diagnosis not present

## 2018-03-05 DIAGNOSIS — I2729 Other secondary pulmonary hypertension: Secondary | ICD-10-CM | POA: Diagnosis not present

## 2018-03-05 LAB — CBC WITH DIFFERENTIAL/PLATELET
BASOS ABS: 0.1 10*3/uL (ref 0.0–0.1)
Basophils Relative: 0.8 % (ref 0.0–3.0)
Eosinophils Absolute: 0.2 10*3/uL (ref 0.0–0.7)
Eosinophils Relative: 1.2 % (ref 0.0–5.0)
HEMATOCRIT: 51.5 % — AB (ref 36.0–46.0)
HEMOGLOBIN: 17.2 g/dL — AB (ref 12.0–15.0)
LYMPHS PCT: 22.1 % (ref 12.0–46.0)
Lymphs Abs: 3.1 10*3/uL (ref 0.7–4.0)
MCHC: 33.4 g/dL (ref 30.0–36.0)
MCV: 95.7 fl (ref 78.0–100.0)
Monocytes Absolute: 1 10*3/uL (ref 0.1–1.0)
Monocytes Relative: 6.9 % (ref 3.0–12.0)
Neutro Abs: 9.8 10*3/uL — ABNORMAL HIGH (ref 1.4–7.7)
Neutrophils Relative %: 69 % (ref 43.0–77.0)
Platelets: 244 10*3/uL (ref 150.0–400.0)
RBC: 5.38 Mil/uL — AB (ref 3.87–5.11)
RDW: 15.9 % — ABNORMAL HIGH (ref 11.5–15.5)
WBC: 14.1 10*3/uL — ABNORMAL HIGH (ref 4.0–10.5)

## 2018-03-05 LAB — BASIC METABOLIC PANEL
BUN: 17 mg/dL (ref 6–23)
CHLORIDE: 96 meq/L (ref 96–112)
CO2: 29 meq/L (ref 19–32)
Calcium: 9.7 mg/dL (ref 8.4–10.5)
Creatinine, Ser: 0.89 mg/dL (ref 0.40–1.20)
GFR: 78.37 mL/min (ref >60.00)
Glucose, Bld: 80 mg/dL (ref 70–99)
Potassium: 4.2 mEq/L (ref 3.5–5.1)
Sodium: 132 mEq/L — ABNORMAL LOW (ref 135–145)

## 2018-03-05 LAB — SEDIMENTATION RATE: SED RATE: 18 mm/h (ref 0–30)

## 2018-03-05 LAB — BRAIN NATRIURETIC PEPTIDE: Pro B Natriuretic peptide (BNP): 169 pg/mL — ABNORMAL HIGH (ref 0.0–100.0)

## 2018-03-05 NOTE — Progress Notes (Signed)
Subjective:     Patient ID: Monica Mora, female   DOB: 03-13-1935,    MRN: [id number]    Brief patient profile:  77 yobf  quit smoking 1980 with new onset doe x spring 2017 with neg cards w/u 06/10/17 (neg lexiscan) so referred to pulmonary clinic 11/21/2017 by Dr   Jenny Reichmann with h/o PAF on coumadin and also s/p remote pericardial window 2002 in [place] for "constrictive" pericarditis with abn baseline cxr but no source for pericarditis identified per pt and never saw rheumatologist as was just in [place] visit and the window solved the problem s specific dx     History of Present Illness  11/21/2017 1st [facility] office visit/ Kabir Brannock   Chief Complaint  Patient presents with  . Pulmonary Consult    Referred by Dr. Cathlean Cower.  Pt c/o SOB for the past year. She gets winded walking "a distance" and walking up stairs. She uses her albuterol inhaler 1-2 x per day on average. She has occ PND and non prod cough.     1st noted spring 2017 trip to [place] more doe and then steps at home more difficult x  6 months and variable has to stop at top  Also Has to walk slower when shopping  Notes sometime discomfort episgastric with exertion only not related to eating x one year Sleep 2 pillows  Started needing albuterol one month prior to OV  Cough started  One year prior to [place]   "it's due to drainage" but note it is dry and only daytime Treated for "pna" in Dec 2018 but never had fever/ purulent sputum or any acute illness to correlate with "pna on cxr" by radiology and no better p rx as cap Hoarseness, sore throat= scratchy/pnds, dysphagia, dental problems, itching, sneezing,  nasal congestion o  rec  Pantoprazole (protonix) 40 mg   Take  30-60 min before first meal of the day and Pepcid (famotidine)  20 mg one @  bedtime until return to office - this is the best way to tell whether stomach acid is contributing to your problem.   GERD  stop lisinopril  And start telmisartan 80  mg one daily   Please  schedule a follow up office visit in 4 weeks, sooner if needed  with all medications /inhalers/ solutions in hand so we can verify exactly what you are taking. This includes all medications from all doctors and over the counters - add 6 m walk on return    12/17/2017  f/u ov/Haeley Fordham re: chronic doe/ cough ? acei  Chief Complaint  Patient presents with  . Follow-up    6MW test done today. Breathing is unchanged. Her cough is gone. She uses albuterol inhaler once per wk on average.   Dyspnea:  Sometimes on steps / if goes slow does fine =  MMRC1 = can walk nl pace, flat grade, can't hurry or go uphills or steps s sob   Cough: denies  Sleep: 2 pillows  SABA use:  Rare rec No change in meds HRCT chest  12/31/16> c/w lung ca    01/01/2018  f/u ov/Anikin Prosser re:  Lung mass/ doe  Chief Complaint  Patient presents with  . Follow-up    Per pt, follow up after CT scan. Breathing has been about the same since last visit.   Dyspnea:  MMRC2 = can't walk a nl pace on a flat grade s sob but does fine slow and flat    Cough: gone  since stopped lisinopril  Sleep: 2 pillows  SABA use:  Not using now  rec PET c/w ca > neg fob 01/21/18   Repeat FOB 02/11/18 Dr Lamonte Sakai  >  Neg ebus/ neg TBBx     02/13/2018  f/u ov/Elmo Shumard re: doe/ pulmonary infiltrates  Chief Complaint  Patient presents with  . Follow-up    Breathing progressively worse. She is using her proair at least 4 x per day. She also c/o non prod cough.   Dyspnea:  50 ft  Cough: more since procedure but dry   Sleep: ok  SABA use: reports does help for up to 2 hours   rec Prednisone 10mg   X 4 each am with bfast until feeling better then reduce to 3 daily for a week then 2 daily   Only use your albuterol as a rescue medication  Work on inhaler technique:   - add  Lasix 20 mg daily / repeat echo  02/18/18 >>> C/w PH  - ANA pos 1:640 homogenous pattern    03/05/2018  f/u ov/Ernesto Zukowski re:? Lupus pneumonitis on  prednisone 10 x 2  Chief Complaint  Patient  presents with  . Follow-up    Her breathing is doing well. She had to park a long distance and walk up stairs for her visit today, and was able to do this with no problems. She is using her proair 3 x per wk on average.    Dyspnea:  MMRC1 = can walk nl pace, flat grade, can't hurry or go uphills or steps s sob  - all over home [employer] sev days prior to OV  s need  saba or getting sob  Cough: none Sleep: one pillow  SABA use:  3 x weekly   No obvious day to day or daytime variability or assoc excess/ purulent sputum or mucus plugs or hemoptysis or cp or chest tightness, subjective wheeze or overt sinus or hb symptoms. No unusual exposure hx or h/o childhood pna/ asthma or knowledge of premature birth.  Sleeping fine on one pillow without nocturnal  or early am exacerbation  of respiratory  c/o's or need for noct saba. Also denies any obvious fluctuation of symptoms with weather or environmental changes or other aggravating or alleviating factors except as outlined above   Current Allergies, Complete Past Medical History, Past Surgical History, Family History, and Social History were reviewed in [employer] record.  ROS  The following are not active complaints unless bolded Hoarseness, sore throat, dysphagia, dental problems, itching, sneezing,  nasal congestion or discharge of excess mucus or purulent secretions, ear ache,   fever, chills, sweats, unintended wt loss or wt gain, classically pleuritic or exertional cp,  orthopnea pnd or arm/hand swelling  or leg swelling, presyncope, palpitations, abdominal pain, anorexia, nausea, vomiting, diarrhea  or change in bowel habits or change in bladder habits, change in stools or change in urine, dysuria, hematuria,  rash, arthralgias, visual complaints, headache, numbness, weakness or ataxia or problems with walking or coordination,  change in mood or  memory.        Current Meds  Medication Sig  . albuterol (PROAIR  HFA) 108 (90 Base) MCG/ACT inhaler Inhale 2 puffs into the lungs every 6 (six) hours as needed for wheezing or shortness of breath.  . brimonidine (ALPHAGAN) 0.2 % ophthalmic solution Place 1 drop into the left eye 3 (three) times daily.  . Cholecalciferol (VITAMIN D) 1000 UNITS capsule Take 1,000 Units by mouth  daily.    . dorzolamide-timolol (COSOPT) 22.3-6.8 MG/ML ophthalmic solution Place 1 drop into both eyes 2 (two) times daily.   . famotidine (PEPCID) 20 MG tablet Take 1 tablet (20 mg total) by mouth at bedtime.  . furosemide (LASIX) 20 MG tablet Take 1 tablet (20 mg total) by mouth daily.  . hydrochlorothiazide (MICROZIDE) 12.5 MG capsule TAKE ONE CAPSULE BY MOUTH EVERY DAY (Patient taking differently: TAKE 12.5 MG BY MOUTH EVERY DAY)  . levothyroxine (SYNTHROID, LEVOTHROID) 75 MCG tablet Take 1 tablet (75 mcg total) by mouth daily.  . pantoprazole (PROTONIX) 40 MG tablet Take 1 tablet (40 mg total) by mouth daily. Take 30-60 min before first meal of the day  . predniSONE (DELTASONE) 10 MG tablet Take  4 each am  Until breathing better then 3 daily x one week then 2 daily until seen  . telmisartan (MICARDIS) 80 MG tablet Take 1 tablet (80 mg total) by mouth daily.  . TOPROL XL 50 MG 24 hr tablet TAKE 1 TABLET BY MOUTH DAILY. TAKE WITH OR IMMEDIATELY FOLLOWING A MEAL (Patient taking differently: Take 50 mg by mouth daily. )  . Travoprost, BAK Free, (TRAVATAN) 0.004 % SOLN ophthalmic solution Place 1 drop into both eyes at bedtime.  . vitamin B-12 (CYANOCOBALAMIN) 500 MCG tablet Take 500 mcg by mouth daily.  Marland Kitchen warfarin (COUMADIN) 5 MG tablet Take as directed by coumadin clinic (Patient taking differently: Take 2.5-5 mg by mouth See admin instructions. Take 5 mg by mouth daily on Sunday, Tuesday, Thursday and Saturday. Take 2.5 mg by mouth daily on all other days.)                 Objective:   Physical Exam    03/05/2018       205  02/13/2018       214  01/01/2018         207   12/17/2017       20 9   11/21/17 207 lb (93.9 kg)  11/20/17 207 lb 1.9 oz (93.9 kg)  11/19/17 210 lb (95.3 kg)    Vital signs reviewed - Note on arrival 02 sats  99 % on RA         HEENT: nl   turbinates bilaterally, and oropharynx. Nl external ear canals without cough reflex- - top dentures/ lower partials    NECK :  without JVD/Nodes/TM/ nl carotid upstrokes bilaterally   LUNGS: no acc muscle use,  Nl contour chest which is clear to A and P bilaterally without cough on insp or exp maneuvers   CV:  RRR  no s3 or murmur or increase in P2, and no edema   ABD:  soft and nontender with nl inspiratory excursion in the supine position. No bruits or organomegaly appreciated, bowel sounds nl  MS:  Nl gait/ ext warm without deformities, calf tenderness, cyanosis or clubbing No obvious joint restrictions   SKIN: warm and dry without lesions    NEURO:  alert, approp, nl sensorium with  no motor or cerebellar deficits apparent.          CXR PA and Lateral:   03/05/2018 :    I personally reviewed images and   impression as follows:   Improved aeration esp at R base / cardiophrenic angle (not longer such a prominent double shadow)        Lab Results  Component Value Date   ESRSEDRATE 18 03/05/2018   ESRSEDRATE 30 02/13/2018   ESRSEDRATE 39 (H)  07/03/2010       Assessment:

## 2018-03-05 NOTE — Patient Instructions (Signed)
Please remember to go to the lab and x-ray department downstairs in the basement  for your tests - we will call you with the results when they are available.     Please schedule a follow up office visit in 6 weeks, call sooner if needed

## 2018-03-06 DIAGNOSIS — H401112 Primary open-angle glaucoma, right eye, moderate stage: Secondary | ICD-10-CM | POA: Diagnosis not present

## 2018-03-06 DIAGNOSIS — H401123 Primary open-angle glaucoma, left eye, severe stage: Secondary | ICD-10-CM | POA: Diagnosis not present

## 2018-03-06 NOTE — Progress Notes (Signed)
Spoke with pt and notified of results per Dr. Wert. Pt verbalized understanding and denied any questions. 

## 2018-03-08 ENCOUNTER — Encounter: Payer: Self-pay | Admitting: Internal Medicine

## 2018-03-08 DIAGNOSIS — I2729 Other secondary pulmonary hypertension: Secondary | ICD-10-CM | POA: Insufficient documentation

## 2018-03-08 LAB — ANTI-NUCLEAR AB-TITER (ANA TITER): ANA Titer 1: 1:320 {titer} — ABNORMAL HIGH

## 2018-03-08 LAB — ANA: ANA: POSITIVE — AB

## 2018-03-08 LAB — ANTI-DNA ANTIBODY, DOUBLE-STRANDED: ds DNA Ab: <1 IU/mL

## 2018-03-08 NOTE — Assessment & Plan Note (Signed)
-   Lexiscan neg 06/01/17  Neg  - PFT's  11/21/2017  FEV1 1.99 (108 % ) ratio 91  p no % improvement from saba p no prior to study with DLCO  29/28 % corrects to 45  % for alv volume   - 12/17/17 6 mw  312 m moderate pace, lowest sat was 90%  - Spirometry 01/01/2018  FEV1 1.76 (96%)  Ratio 8  Improved despite PH on echo (see separate a/p)

## 2018-03-08 NOTE — Assessment & Plan Note (Addendum)
See echo 02/18/18  - Normal LV size with EF 55-60%. The RV was moderately dilated with   mildly decreased systolic function. D-shaped interventricular   septum was suggestive of RV pressure/volume overload. Severe   pulmonary hypertension.  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

## 2018-03-08 NOTE — Assessment & Plan Note (Signed)
12/31/17 HRCT Chest 1. Masslike consolidation in the right lower lobe measuring 7.3 x 6.5 cm with associated volume loss and distortion of the right major fissure. There has been persistent consolidation in this location on multiple recent chest radiographs back to 05/22/2017. In addition, there was a morphologically suspicious irregular 2.7 cm pulmonary nodule in the anterior right lower lobe on the 11/08/2008 chest CT. While nonspecific, these findings are highly concerning for right lower lobe lung adenocarcinoma. 2. Numerous subsolid and solid pulmonary nodules in both lungs, worrisome for pulmonary metastases. 3. Patchy interlobular septal thickening in both lungs, right greater than left, worrisome for lymphangitic spread of tumor. 4. New nonspecific right paratracheal, subcarinal and bilateral hilar lymphadenopathy. Nodal metastases not excluded. - FOB  01/21/18  > atypical cells lining bronchi c/w  - Repeat FOB 02/11/18 Dr Lamonte Sakai  >  Neg ebus/ neg TBBx  - Quant TB 02/13/18 neg  - Collagen vasc screen 02/13/18 :  ESR 30/ ANA pos  1:640 homogeneous  - HSP 02/13/18 neg  - Rx prednisone 02/13/18 with borderline hypoxemia > all symptoms resolved 03/05/2018 with improved aeration R base and anti dna neg    Clearly much of what she's been experiencing and we are seeing are related to inflammatory lung dz ? Etiology with striking improvement on prednisone since prev eval   The goal with a chronic steroid dependent illness is always arriving at the lowest effective dose that controls the disease/symptoms and not accepting a set "formula" which is based on statistics or guidelines that don't always take into account patient  variability or the natural hx of the dz in every individual patient, which may well vary over time.  For now therefore I recommend the patient maintain  20 mg per day for now and repeat CT s contrast prior to next ov/ lowering of pred dose   Discussed in detail all the   indications, usual  risks and alternatives  relative to the benefits with patient who agrees to proceed with conservative f/u as outlined    I had an extended discussion with the patient reviewing all relevant studies completed to date and  lasting 15 to 20 minutes of a 25 minute visit    Each maintenance medication was reviewed in detail including most importantly the difference between maintenance and prns and under what circumstances the prns are to be triggered using an action plan format that is not reflected in the computer generated alphabetically organized AVS.    Please see AVS for specific instructions unique to this visit that I personally wrote and verbalized to the the pt in detail and then reviewed with pt  by my nurse highlighting any  changes in therapy recommended at today's visit to their plan of care.

## 2018-03-09 ENCOUNTER — Telehealth: Payer: Self-pay | Admitting: *Deleted

## 2018-03-09 ENCOUNTER — Telehealth: Payer: Self-pay | Admitting: Internal Medicine

## 2018-03-09 NOTE — Telephone Encounter (Signed)
See other phone note dated today 

## 2018-03-09 NOTE — Telephone Encounter (Signed)
-----   Message from Tanda Rockers, MD sent at 03/08/2018  8:45 AM EDT ----- Let her know I ordered ONO on whatever she's using (I don't believe she uses 02 though she checked the box that she did at last ov ) and leave prednisone at 20 mg daily until next ov and get ct chest s contrast prior to visit dx pulmonary infiltrates

## 2018-03-09 NOTE — Telephone Encounter (Signed)
Spoke with the pt  She does not agree to have another ct  She states she just had one in April 2019  She is asking why this needs to be repeated  She is also asking how long she needs to continue on her lasix  Please advise thanks

## 2018-03-09 NOTE — Telephone Encounter (Signed)
Spoke with the pt  She is asking how long to continue on prednisone  She is currently taking 10 mg daily  She states her eye doc is concerned due to elevated pressures in her eyes  Please advise thanks

## 2018-03-09 NOTE — Telephone Encounter (Signed)
No need for ct - ok to cancel if it was scheduled (she declined to have it as she is feeling so much better)  I told her to try prednisone reduction to 10 mg daily and increase back to 2 x 10 mg if worse but that her lungs took priority over her ocular pressures as these can be controlled by changing eyedrops which easier than finding an alternative to prednisone here

## 2018-03-09 NOTE — Progress Notes (Signed)
Spoke with pt and notified of results per Dr. Wert. Pt verbalized understanding and denied any questions. 

## 2018-03-11 ENCOUNTER — Ambulatory Visit (INDEPENDENT_AMBULATORY_CARE_PROVIDER_SITE_OTHER): Payer: Medicare Other | Admitting: *Deleted

## 2018-03-11 DIAGNOSIS — I480 Paroxysmal atrial fibrillation: Secondary | ICD-10-CM | POA: Diagnosis not present

## 2018-03-11 DIAGNOSIS — Z5181 Encounter for therapeutic drug level monitoring: Secondary | ICD-10-CM

## 2018-03-11 DIAGNOSIS — Z7901 Long term (current) use of anticoagulants: Secondary | ICD-10-CM | POA: Diagnosis not present

## 2018-03-11 DIAGNOSIS — I4891 Unspecified atrial fibrillation: Secondary | ICD-10-CM | POA: Diagnosis not present

## 2018-03-11 LAB — POCT INR: INR: 3.8 — AB (ref 2.0–3.0)

## 2018-03-11 NOTE — Patient Instructions (Addendum)
Description   Do not take coumadin tomorrow May 23rd as she has already taken coumadin today then change coumadin dose to  1/2  tablet daily except 1 tablet on Tuesday Thursday and Saturday  Recheck INR in 2 weeks. . Call us any medication changes or concerns to  [phone] [facility]  Remains on Prednisone 20 mg daily to be re evaluated by Dr Melvyn Novas end of June

## 2018-03-12 LAB — FUNGAL ORGANISM REFLEX

## 2018-03-12 LAB — FUNGUS CULTURE WITH STAIN

## 2018-03-12 LAB — FUNGUS CULTURE RESULT

## 2018-03-18 ENCOUNTER — Ambulatory Visit: Payer: Medicare Other | Admitting: Internal Medicine

## 2018-03-25 ENCOUNTER — Ambulatory Visit (INDEPENDENT_AMBULATORY_CARE_PROVIDER_SITE_OTHER): Payer: Medicare Other | Admitting: Pharmacist

## 2018-03-25 DIAGNOSIS — I480 Paroxysmal atrial fibrillation: Secondary | ICD-10-CM | POA: Diagnosis not present

## 2018-03-25 DIAGNOSIS — I4891 Unspecified atrial fibrillation: Secondary | ICD-10-CM | POA: Diagnosis not present

## 2018-03-25 DIAGNOSIS — Z7901 Long term (current) use of anticoagulants: Secondary | ICD-10-CM | POA: Diagnosis not present

## 2018-03-25 LAB — POCT INR: INR: 4.4 — AB (ref 2.0–3.0)

## 2018-03-25 NOTE — Patient Instructions (Signed)
Description   Do not take coumadin today or tomorrow  then change coumadin dose to  1/2  tablet daily except 1 tablet on Tuesday  and Saturday  Recheck INR in 10 days. Call us any medication changes or concerns to  [phone] [facility]  Remains on Prednisone 20 mg daily to be re evaluated by Dr Melvyn Novas end of June

## 2018-03-26 ENCOUNTER — Telehealth: Payer: Self-pay | Admitting: Internal Medicine

## 2018-03-26 MED ORDER — PREDNISONE 10 MG PO TABS: ORAL_TABLET | ORAL | 0 refills | Status: DC

## 2018-03-26 NOTE — Telephone Encounter (Signed)
Per MW- Prednisone refilled 20mg  daily for 2 weeks until next OV then 10mg  daily.   Patient would like Dr Melvyn Novas to know that she is having wheezing at night when she goes to bed. She says that it is a light, not annoying sound, and wasn't sure if it was her chest or throat.  She also wants to make aware that she went to coumadin clinic yesterday 03/25/18 and her coumadin was lowered and she was told to eat green, leafy vegetables.    Will route to [facility]

## 2018-03-26 NOTE — Telephone Encounter (Signed)
Refill and stay on pred 20 mg daily until 2  weeks before the next ov then 10 mg daily

## 2018-03-26 NOTE — Telephone Encounter (Signed)
Called and reminded Patient that when she comes to her OV 04/16/18 to bring all meds with her.  Patient stated understanding. Nothing further needed at this time.

## 2018-03-26 NOTE — Telephone Encounter (Signed)
No change in recs but be sure to bring all meds with her

## 2018-03-26 NOTE — Telephone Encounter (Signed)
Called and spoke to pt.  Pt is questioning if she should continue prednisone.  Pt was prescribed prednisone taper on 02/13/18 and was instructed to continue at 20mg  daily until 03/05/18 OV. Pt stated she was instructed by MW to continue prednisone at 03/05/18 OV.  Pt stated she only has 3 tablets left and is requesting a refill if MW wishes for her to continue 04/16/18 OV. Pt c/o wheezing mainly at night.  MW please advise if okay to send refill. Thanks  03/05/18 AVS:       Instructions   Please remember to go to the lab and x-ray department downstairs in the basement  for your tests - we will call you with the results when they are available.     Please schedule a follow up office visit in 6 weeks, call sooner if needed

## 2018-03-30 DIAGNOSIS — H401123 Primary open-angle glaucoma, left eye, severe stage: Secondary | ICD-10-CM | POA: Diagnosis not present

## 2018-03-30 DIAGNOSIS — H401112 Primary open-angle glaucoma, right eye, moderate stage: Secondary | ICD-10-CM | POA: Diagnosis not present

## 2018-03-30 LAB — ACID FAST SMEAR (AFB, MYCOBACTERIA)

## 2018-03-30 LAB — ACID FAST CULTURE WITH REFLEXED SENSITIVITIES (MYCOBACTERIA): Acid Fast Culture: NEGATIVE

## 2018-03-30 LAB — ACID FAST SMEAR (AFB): ACID FAST SMEAR - AFSCU2: NEGATIVE

## 2018-04-02 ENCOUNTER — Ambulatory Visit: Payer: Medicare Other | Admitting: *Deleted

## 2018-04-02 DIAGNOSIS — I4891 Unspecified atrial fibrillation: Secondary | ICD-10-CM | POA: Diagnosis not present

## 2018-04-02 DIAGNOSIS — Z5181 Encounter for therapeutic drug level monitoring: Secondary | ICD-10-CM | POA: Diagnosis not present

## 2018-04-02 DIAGNOSIS — Z7901 Long term (current) use of anticoagulants: Secondary | ICD-10-CM | POA: Diagnosis not present

## 2018-04-02 DIAGNOSIS — I480 Paroxysmal atrial fibrillation: Secondary | ICD-10-CM | POA: Diagnosis not present

## 2018-04-02 LAB — POCT INR: INR: 1.8 — AB (ref 2.0–3.0)

## 2018-04-02 NOTE — Patient Instructions (Signed)
Description   Today June 13th take another 1/2 tablet as she has already taken 1/2 tablet today then continue same dose of coumadin  1/2  tablet daily except 1 tablet on Tuesday  and Saturday  Recheck INR in  2 weeks Call us any medication changes or concerns to  [phone] [facility]  Remains on Prednisone 20 mg daily to be re evaluated by Dr Melvyn Novas end of June

## 2018-04-09 ENCOUNTER — Encounter: Payer: Self-pay | Admitting: Internal Medicine

## 2018-04-09 DIAGNOSIS — J449 Chronic obstructive pulmonary disease, unspecified: Secondary | ICD-10-CM | POA: Diagnosis not present

## 2018-04-09 DIAGNOSIS — R0902 Hypoxemia: Secondary | ICD-10-CM | POA: Diagnosis not present

## 2018-04-15 ENCOUNTER — Telehealth: Payer: Self-pay | Admitting: Internal Medicine

## 2018-04-15 ENCOUNTER — Encounter: Payer: Self-pay | Admitting: Internal Medicine

## 2018-04-15 DIAGNOSIS — G4734 Idiopathic sleep related nonobstructive alveolar hypoventilation: Secondary | ICD-10-CM | POA: Insufficient documentation

## 2018-04-15 DIAGNOSIS — H5213 Myopia, bilateral: Secondary | ICD-10-CM | POA: Diagnosis not present

## 2018-04-15 DIAGNOSIS — H524 Presbyopia: Secondary | ICD-10-CM | POA: Diagnosis not present

## 2018-04-15 NOTE — Telephone Encounter (Signed)
ONO and RA done by [facility] on 04/09/18 Per MW- pt needs to start on o2 with sleep 2 lpm and repeat ONO on 2lpm  LMTCB for pt before placing DME orders

## 2018-04-16 ENCOUNTER — Ambulatory Visit (INDEPENDENT_AMBULATORY_CARE_PROVIDER_SITE_OTHER)
Admission: RE | Admit: 2018-04-16 | Discharge: 2018-04-16 | Disposition: A | Discharge status: Home / Self Care | Payer: Medicare Other | Source: Ambulatory Visit | Attending: Internal Medicine | Admitting: Internal Medicine

## 2018-04-16 ENCOUNTER — Encounter: Payer: Self-pay | Admitting: Internal Medicine

## 2018-04-16 ENCOUNTER — Ambulatory Visit: Payer: Medicare Other | Admitting: Internal Medicine

## 2018-04-16 VITALS — BP 138/84 | HR 68 | Ht 66.5 in | Wt 212.0 lb

## 2018-04-16 DIAGNOSIS — I2729 Other secondary pulmonary hypertension: Secondary | ICD-10-CM

## 2018-04-16 DIAGNOSIS — R918 Other nonspecific abnormal finding of lung field: Secondary | ICD-10-CM | POA: Diagnosis not present

## 2018-04-16 DIAGNOSIS — R058 Other specified cough: Secondary | ICD-10-CM

## 2018-04-16 DIAGNOSIS — R05 Cough: Secondary | ICD-10-CM | POA: Diagnosis not present

## 2018-04-16 DIAGNOSIS — G4734 Idiopathic sleep related nonobstructive alveolar hypoventilation: Secondary | ICD-10-CM

## 2018-04-16 NOTE — Telephone Encounter (Signed)
Pt here for ov and I have let her know about the ONO results  She verbalized understanding  She refuses to start on noct o2 Will discuss further with MW at [facility]

## 2018-04-16 NOTE — Patient Instructions (Addendum)
When you return from [facility] reduce prednisone 10 mg to one daily   For drainage / throat tickle try take CHLORPHENIRAMINE  4 mg - take one every 4 hours as needed - available over the counter- may cause drowsiness so start with a dose or two one hour before bedtime to see to see what it does to help your night time wheeze from your throat.   Please remember to go to the  x-ray department downstairs in the basement  for your tests - we will call you with the results when they are available.      Please schedule a follow up office visit in 6 weeks, sooner if needed

## 2018-04-16 NOTE — Progress Notes (Signed)
Subjective:     Patient ID: Monica Mora, female   DOB: Sep 06, 1935,    MRN: [id number]    Brief patient profile:  41 yobf  quit smoking 1980 with new onset doe x spring 2017 with neg cards w/u 06/10/17 (neg lexiscan) so referred to pulmonary clinic 11/21/2017 by Dr   Monica Mora with h/o PAF on coumadin and also s/p remote pericardial window 2002 in   [place]  for "constrictive" pericarditis s specific dx   and never saw rheumatologist as was just in [place] visiting and the window solved the problem but has had persistent non-specific  changes on CT chest in [place] since 11/06/2004 first detected here     History of Present Illness  11/21/2017 1st [facility] office visit/ Monica Mora   Chief Complaint  Patient presents with  . Pulmonary Consult    Referred by Dr. Cathlean Mora.  Pt c/o SOB for the past year. She gets winded walking "a distance" and walking up stairs. She uses her albuterol inhaler 1-2 x per day on average. She has occ PND and non prod cough.     1st noted spring 2017 trip to [place] more doe and then steps at home more difficult x  6 months and variable has to stop at top  Also Has to walk slower when shopping  Notes sometime discomfort episgastric with exertion only not related to eating x one year Sleep 2 pillows  Started needing albuterol one month prior to OV  Cough started  One year prior to [employer]   "it's due to drainage" but note it is dry and only daytime Treated for "pna" in Dec 2018 but never had fever/ purulent sputum or any acute illness to correlate with "pna on cxr" by radiology and no better p rx as cap Hoarseness, sore throat= scratchy/pnds, dysphagia, dental problems, itching, sneezing,  nasal congestion o  rec  Pantoprazole (protonix) 40 mg   Take  30-60 min before first meal of the day and Pepcid (famotidine)  20 mg one @  bedtime until return to office - this is the best way to tell whether stomach acid is contributing to your problem.   GERD  stop lisinopril  And start  telmisartan 80  mg one daily   Please schedule a follow up office visit in 4 weeks, sooner if needed  with all medications /inhalers/ solutions in hand so we can verify exactly what you are taking. This includes all medications from all doctors and over the counters - add 6 m walk on return    12/17/2017  f/u ov/Monica Mora re: chronic doe/ cough ? acei  Chief Complaint  Patient presents with  . Follow-up    6MW test done today. Breathing is unchanged. Her cough is gone. She uses albuterol inhaler once per wk on average.   Dyspnea:  Sometimes on steps / if goes slow does fine =  MMRC1 = can walk nl pace, flat grade, can't hurry or go uphills or steps s sob   Cough: denies  Sleep: 2 pillows  SABA use:  Rare rec No change in meds HRCT chest  12/31/16> c/w lung ca    01/01/2018  f/u ov/Monica Mora re:  Lung mass/ doe  Chief Complaint  Patient presents with  . Follow-up    Per pt, follow up after CT scan. Breathing has been about the same since last visit.   Dyspnea:  MMRC2 = can't walk a nl pace on a flat grade s sob but  does fine slow and flat    Cough: gone since stopped lisinopril  Sleep: 2 pillows  SABA use:  Not using now  rec PET c/w ca > neg fob 01/21/18   Repeat FOB 02/11/18 Dr Monica Mora  >  Neg ebus/ neg TBBx     02/13/2018  f/u ov/Monica Mora re: doe/ pulmonary infiltrates  Chief Complaint  Patient presents with  . Follow-up    Breathing progressively worse. She is using her proair at least 4 x per day. She also c/o non prod cough.   Dyspnea:  50 ft  Cough: more since procedure but dry   Sleep: ok  SABA use: reports does help for up to 2 hours   rec Prednisone 10mg   X 4 each am with bfast until feeling better then reduce to 3 daily for a week then 2 daily   Only use your albuterol as a rescue medication  Work on inhaler technique:   - add  Lasix 20 mg daily / repeat echo  02/18/18 >>> C/w PH  - ANA pos 1:640 homogenous pattern    03/05/2018  f/u ov/Monica Mora re:? Lupus pneumonitis on   prednisone 10 x 2  Chief Complaint  Patient presents with  . Follow-up    Her breathing is doing well. She had to park a long distance and walk up stairs for her visit today, and was able to do this with no problems. She is using her proair 3 x per wk on average.    Dyspnea:  MMRC1 = can walk nl pace, flat grade, can't hurry or go uphills or steps s sob  - all over [employer] sev days prior to OV  s need  saba or getting sob  Cough: none Sleep: one pillow  SABA use:  3 x weekly  rec No change rx    04/16/2018  f/u ov/Monica Mora re:  Inflammatory as dz  with pos ana but neg dna on pred 20 mg daily  Chief Complaint  Patient presents with  . Follow-up    Breathing seems to be doing well. She notices some wheezing at night when she lies down. She states she does not wish to begin on nocturnal o2.   Dyspnea:  MMRC1 = can walk nl pace, flat grade, can't hurry or go uphills or steps s sob   Cough:  Gone but some noct wheeze  Assoc with sensation of  pnds   SABA use: alb once a day  02: does not have it  / flatly refuses to consider using it    No obvious day to day or daytime variability or assoc excess/ purulent sputum or mucus plugs or hemoptysis or cp or chest tightness, subjective wheeze or overt sinus or hb symptoms.   Sleeping: two pillows without nocturnal  or early am exacerbation  of respiratory  c/o's or need for noct saba. Also denies any obvious fluctuation of symptoms with weather or environmental changes or other aggravating or alleviating factors except as outlined above   No unusual exposure hx or h/o childhood pna/ asthma or knowledge of premature birth.  Current Allergies, Complete Past Medical History, Past Surgical History, Family History, and Social History were reviewed in [employer] record.  ROS  The following are not active complaints unless bolded Hoarseness, sore throat, dysphagia, dental problems, itching, sneezing,  nasal  congestion or discharge of excess mucus or purulent secretions, ear ache,   fever, chills, sweats, unintended wt loss or wt  gain, classically pleuritic or exertional cp,  orthopnea pnd or arm/hand swelling  or leg swelling, presyncope, palpitations, abdominal pain, anorexia, nausea, vomiting, diarrhea  or change in bowel habits or change in bladder habits, change in stools or change in urine, dysuria, hematuria,  rash, arthralgias, visual complaints, headache, numbness, weakness or ataxia or problems with walking or coordination,  change in mood or  memory.        Current Meds  Medication Sig  . albuterol (PROAIR HFA) 108 (90 Base) MCG/ACT inhaler Inhale 2 puffs into the lungs every 6 (six) hours as needed for wheezing or shortness of breath.  . brimonidine (ALPHAGAN) 0.2 % ophthalmic solution Place 1 drop into the left eye 3 (three) times daily.  . Cholecalciferol (VITAMIN D) 1000 UNITS capsule Take 1,000 Units by mouth daily.    . dorzolamide-timolol (COSOPT) 22.3-6.8 MG/ML ophthalmic solution Place 1 drop into both eyes 2 (two) times daily.   . famotidine (PEPCID) 20 MG tablet Take 1 tablet (20 mg total) by mouth at bedtime.  . furosemide (LASIX) 20 MG tablet Take 1 tablet (20 mg total) by mouth daily.  . hydrochlorothiazide (MICROZIDE) 12.5 MG capsule TAKE ONE CAPSULE BY MOUTH EVERY DAY (Patient taking differently: TAKE 12.5 MG BY MOUTH EVERY DAY)  . levothyroxine (SYNTHROID, LEVOTHROID) 75 MCG tablet Take 1 tablet (75 mcg total) by mouth daily.  . pantoprazole (PROTONIX) 40 MG tablet Take 1 tablet (40 mg total) by mouth daily. Take 30-60 min before first meal of the day  . pilocarpine (PILOCAR) 1 % ophthalmic solution Place 1 drop into the left eye 3 (three) times daily.  . predniSONE (DELTASONE) 10 MG tablet 20mg  daily until next OV, then 10mg  daily  . telmisartan (MICARDIS) 80 MG tablet Take 1 tablet (80 mg total) by mouth daily.  . TOPROL XL 50 MG 24 hr tablet TAKE 1 TABLET BY MOUTH DAILY.  TAKE WITH OR IMMEDIATELY FOLLOWING A MEAL (Patient taking differently: Take 50 mg by mouth daily. )  . Travoprost, BAK Free, (TRAVATAN) 0.004 % SOLN ophthalmic solution Place 1 drop into both eyes at bedtime.  . vitamin B-12 (CYANOCOBALAMIN) 500 MCG tablet Take 500 mcg by mouth daily.  Marland Kitchen warfarin (COUMADIN) 5 MG tablet Take as directed by coumadin clinic (Patient taking differently: Take 2.5-5 mg by mouth See admin instructions. Take 5 mg by mouth daily on Sunday, Tuesday, Thursday and Saturday. Take 2.5 mg by mouth daily on all other days.)                   Objective:   Physical Exam   04/16/2018       212 03/05/2018       205  02/13/2018       214  01/01/2018         207  12/17/2017       20 9   11/21/17 207 lb (93.9 kg)  11/20/17 207 lb 1.9 oz (93.9 kg)  11/19/17 210 lb (95.3 kg)    Vital signs reviewed - Note on arrival 02 sats  99 % on RA        HEENT: nl  urbinates bilaterally, and oropharynx. Nl external ear canals without cough reflex/ top dentures/ lower partials   NECK :  without JVD/Nodes/TM/ nl carotid upstrokes bilaterally   LUNGS: no acc muscle use,  Nl contour chest which is clear to A and P bilaterally without cough on insp or exp maneuvers   CV:  RRR  no s3  or murmur  - Mild increase P2 -  and no edema   ABD:  soft and nontender with nl inspiratory excursion in the supine position. No bruits or organomegaly appreciated, bowel sounds nl  MS:  Nl gait/ ext warm without deformities, calf tenderness, cyanosis or clubbing No obvious joint restrictions   SKIN: warm and dry without lesions    NEURO:  alert, approp, nl sensorium with  no motor or cerebellar deficits apparent.               Labs reviewed 04/16/2018    Lab Results  Component Value Date   ESRSEDRATE 18 03/05/2018   ESRSEDRATE 30 02/13/2018   ESRSEDRATE 39 (H) 07/03/2010     CXR PA and Lateral:   04/16/2018 :    I personally reviewed images and agree with radiology impression as  follows:   The heart is enlarged. There is prominence of the interstitial markings, stable in appearance. There is persistent opacity in the MEDIAL RIGHT lung base, less well seen on the LATERAL view compared to prior.  Assessment:

## 2018-04-17 ENCOUNTER — Encounter: Payer: Self-pay | Admitting: Internal Medicine

## 2018-04-17 ENCOUNTER — Telehealth: Payer: Self-pay | Admitting: Internal Medicine

## 2018-04-17 ENCOUNTER — Ambulatory Visit: Payer: Medicare Other | Admitting: Pharmacist

## 2018-04-17 DIAGNOSIS — I4891 Unspecified atrial fibrillation: Secondary | ICD-10-CM

## 2018-04-17 DIAGNOSIS — Z7901 Long term (current) use of anticoagulants: Secondary | ICD-10-CM

## 2018-04-17 LAB — POCT INR: INR: 2.9 (ref 2.0–3.0)

## 2018-04-17 NOTE — Telephone Encounter (Signed)
LMTCB

## 2018-04-17 NOTE — Assessment & Plan Note (Signed)
ONO on RA 04/09/18  desat x 3h 69min < 89%  So 04/15/2018 rec 2lpm and repeat ono on 2lpm  - 04/16/2018 pt declined recs

## 2018-04-17 NOTE — Patient Instructions (Signed)
Description   Continue same dose of coumadin 1/2 tablet daily except 1 tablet on Tuesday and Saturday.  Recheck INR in 3 weeks. Call us any medication changes or concerns to [phone]  Remains on Prednisone 20 mg daily to be re evaluated by Dr Melvyn Novas end of June

## 2018-04-17 NOTE — Assessment & Plan Note (Signed)
12/31/17 HRCT Chest 1. Masslike consolidation in the right lower lobe measuring 7.3 x 6.5 cm with associated volume loss and distortion of the right major fissure. There has been persistent consolidation in this location on multiple recent chest radiographs back to 05/22/2017. In addition, there was a morphologically suspicious irregular 2.7 cm pulmonary nodule in the anterior right lower lobe on the 11/08/2008 chest CT. While nonspecific, these findings are highly concerning for right lower lobe lung adenocarcinoma. 2. Numerous subsolid and solid pulmonary nodules in both lungs, worrisome for pulmonary metastases. 3. Patchy interlobular septal thickening in both lungs, right greater than left, worrisome for lymphangitic spread of tumor. 4. New nonspecific right paratracheal, subcarinal and bilateral hilar lymphadenopathy. Nodal metastases not excluded. - FOB  01/21/18  > atypical cells lining bronchi c/w  - Repeat FOB 02/11/18 Dr Lamonte Sakai  >  Neg ebus/ neg TBBx  - Quant TB 02/13/18 neg  - Collagen vasc screen 02/13/18 :  ESR 30/ ANA pos  1:640 homogeneous  - HSP 02/13/18 neg  - Rx prednisone 02/13/18 with borderline hypoxemia > all symptoms much better  03/05/2018 with improved aeration R base and anti dna neg   She remains symptom free but clearly desats with activity (see separate a/p) and noct with secondary PH so not out of the woods by any means but feels so much better vs baseline she politely declines further w/u at this point putting her faith in the lord as she repeatedly states - since I doubt more aggressive dx approach at this point will help her live better or longer I'm not inclined to argue with her but will continue to monitor her and continue on prednisone for now  The goal with a chronic steroid dependent illness is always arriving at the lowest effective dose that controls the disease/symptoms and not accepting a set "formula" which is based on statistics or guidelines that don't  always take into account patient  variability or the natural hx of the dz in every individual patient, which may well vary over time.  For now therefore I recommend the patient maintain  20 mg per day until returns from [place] then 10 mg per day

## 2018-04-17 NOTE — Assessment & Plan Note (Signed)
See echo 02/18/18  - Normal LV size with EF 55-60%. The RV was moderately dilated with   mildly decreased systolic function. D-shaped interventricular   septum was suggestive of RV pressure/volume overload. Severe   pulmonary hypertension. ONO RA 03/08/2018 >>> desats but pt declined 02  - 04/16/2018   Walked RA  2 laps @ 185 ft each stopped due to  desats to 85 nl pace/ no symptoms > declined amb 02

## 2018-04-17 NOTE — Assessment & Plan Note (Signed)
Trial off acei and on ppi rec 11/21/2017 > improved  12/17/2017   - added 1st gen H1 blockers per guidelines  04/16/2018 as unlikely noct wheeze is asthma on 20 mg prednisone per day

## 2018-04-20 MED ORDER — ALBUTEROL SULFATE HFA 108 (90 BASE) MCG/ACT IN AERS: 2.0000 | INHALATION_SPRAY | Freq: Four times a day (QID) | RESPIRATORY_TRACT | 3 refills | Status: DC | PRN

## 2018-04-20 NOTE — Telephone Encounter (Signed)
Spoke with pt. She is leaving a trip this week and wanted to know if she should take a rescue inhaler with her. Advised her it would be a good idea to have in case she needed it. Rx has been sent in per her request. Nothing further was needed.

## 2018-04-28 ENCOUNTER — Other Ambulatory Visit: Payer: Self-pay | Admitting: Internal Medicine

## 2018-04-28 MED ORDER — PREDNISONE 10 MG PO TABS: ORAL_TABLET | ORAL | 5 refills | Status: DC

## 2018-04-29 ENCOUNTER — Other Ambulatory Visit (INDEPENDENT_AMBULATORY_CARE_PROVIDER_SITE_OTHER): Payer: Medicare Other

## 2018-04-29 ENCOUNTER — Ambulatory Visit (INDEPENDENT_AMBULATORY_CARE_PROVIDER_SITE_OTHER)
Admission: RE | Admit: 2018-04-29 | Discharge: 2018-04-29 | Disposition: A | Discharge status: Home / Self Care | Payer: Medicare Other | Source: Ambulatory Visit | Attending: Internal Medicine | Admitting: Internal Medicine

## 2018-04-29 ENCOUNTER — Ambulatory Visit: Payer: Medicare Other | Admitting: Internal Medicine

## 2018-04-29 ENCOUNTER — Encounter: Payer: Self-pay | Admitting: Internal Medicine

## 2018-04-29 ENCOUNTER — Telehealth: Payer: Self-pay | Admitting: Internal Medicine

## 2018-04-29 VITALS — BP 110/70 | HR 86 | Ht 66.0 in | Wt 211.4 lb

## 2018-04-29 DIAGNOSIS — R918 Other nonspecific abnormal finding of lung field: Secondary | ICD-10-CM | POA: Diagnosis not present

## 2018-04-29 LAB — CBC WITH DIFFERENTIAL/PLATELET
Basophils Absolute: 0.1 10*3/uL (ref 0.0–0.1)
Basophils Relative: 0.4 % (ref 0.0–3.0)
EOS ABS: 0 10*3/uL (ref 0.0–0.7)
Eosinophils Relative: 0.1 % (ref 0.0–5.0)
HCT: 45.6 % (ref 36.0–46.0)
HEMOGLOBIN: 15.6 g/dL — AB (ref 12.0–15.0)
LYMPHS ABS: 1.7 10*3/uL (ref 0.7–4.0)
Lymphocytes Relative: 9 % — ABNORMAL LOW (ref 12.0–46.0)
MCHC: 34.2 g/dL (ref 30.0–36.0)
MCV: 95.7 fl (ref 78.0–100.0)
MONO ABS: 0.6 10*3/uL (ref 0.1–1.0)
Monocytes Relative: 3 % (ref 3.0–12.0)
Neutro Abs: 17 10*3/uL — ABNORMAL HIGH (ref 1.4–7.7)
Platelets: 234 10*3/uL (ref 150.0–400.0)
RBC: 4.77 Mil/uL (ref 3.87–5.11)
RDW: 16.7 % — ABNORMAL HIGH (ref 11.5–15.5)

## 2018-04-29 LAB — PROTIME-INR
INR: 3.3 ratio — AB (ref 0.8–1.0)
PROTHROMBIN TIME: 38.2 s — AB (ref 9.6–13.1)

## 2018-04-29 LAB — SEDIMENTATION RATE: SED RATE: 52 mm/h — AB (ref 0–30)

## 2018-04-29 NOTE — Telephone Encounter (Signed)
Received call from lab stating patient had a critical lab of 19.4 WBC. MD Wert made aware, no new orders at this time.

## 2018-04-29 NOTE — Progress Notes (Signed)
Subjective:     Patient ID: Monica Mora, female   DOB: Sep 06, 1935,    MRN: [id number]    Brief patient profile:  41 yobf  quit smoking 1980 with new onset doe x spring 2017 with neg cards w/u 06/10/17 (neg lexiscan) so referred to pulmonary clinic 11/21/2017 by Dr   Monica Mora with h/o PAF on coumadin and also s/p remote pericardial window 2002 in   [place]  for "constrictive" pericarditis s specific dx   and never saw rheumatologist as was just in [place] visiting and the window solved the problem but has had persistent non-specific  changes on CT chest in [place] since 11/06/2004 first detected here     History of Present Illness  11/21/2017 1st [facility] office visit/ Monica Mora   Chief Complaint  Patient presents with  . Pulmonary Consult    Referred by Dr. Cathlean Mora.  Pt c/o SOB for the past year. She gets winded walking "a distance" and walking up stairs. She uses her albuterol inhaler 1-2 x per day on average. She has occ PND and non prod cough.     1st noted spring 2017 trip to [place] more doe and then steps at home more difficult x  6 months and variable has to stop at top  Also Has to walk slower when shopping  Notes sometime discomfort episgastric with exertion only not related to eating x one year Sleep 2 pillows  Started needing albuterol one month prior to OV  Cough started  One year prior to [place]   "it's due to drainage" but note it is dry and only daytime Treated for "pna" in Dec 2018 but never had fever/ purulent sputum or any acute illness to correlate with "pna on cxr" by radiology and no better p rx as cap Hoarseness, sore throat= scratchy/pnds, dysphagia, dental problems, itching, sneezing,  nasal congestion o  rec  Pantoprazole (protonix) 40 mg   Take  30-60 min before first meal of the day and Pepcid (famotidine)  20 mg one @  bedtime until return to office - this is the best way to tell whether stomach acid is contributing to your problem.   GERD  stop lisinopril  And start  telmisartan 80  mg one daily   Please schedule a follow up office visit in 4 weeks, sooner if needed  with all medications /inhalers/ solutions in hand so we can verify exactly what you are taking. This includes all medications from all doctors and over the counters - add 6 m walk on return    12/17/2017  f/u ov/Monica Mora re: chronic doe/ cough ? acei  Chief Complaint  Patient presents with  . Follow-up    6MW test done today. Breathing is unchanged. Her cough is gone. She uses albuterol inhaler once per wk on average.   Dyspnea:  Sometimes on steps / if goes slow does fine =  MMRC1 = can walk nl pace, flat grade, can't hurry or go uphills or steps s sob   Cough: denies  Sleep: 2 pillows  SABA use:  Rare rec No change in meds HRCT chest  12/31/16> c/w lung ca    01/01/2018  f/u ov/Monica Mora re:  Lung mass/ doe  Chief Complaint  Patient presents with  . Follow-up    Per pt, follow up after CT scan. Breathing has been about the same since last visit.   Dyspnea:  MMRC2 = can't walk a nl pace on a flat grade s sob but  does fine slow and flat    Cough: gone since stopped lisinopril  Sleep: 2 pillows  SABA use:  Not using now  rec PET c/w ca > neg fob 01/21/18   Repeat FOB 02/11/18 Dr Monica Mora  >  Neg ebus/ neg TBBx     02/13/2018  f/u ov/Monica Mora re: doe/ pulmonary infiltrates  Chief Complaint  Patient presents with  . Follow-up    Breathing progressively worse. She is using her proair at least 4 x per day. She also c/o non prod cough.   Dyspnea:  50 ft  Cough: more since procedure but dry   Sleep: ok  SABA use: reports does help for up to 2 hours   rec Prednisone 10mg   X 4 each am with bfast until feeling better then reduce to 3 daily for a week then 2 daily   Only use your albuterol as a rescue medication  Work on inhaler technique:   - add  Lasix 20 mg daily / repeat echo  02/18/18 >>> C/w PH  - ANA pos 1:640 homogenous pattern    03/05/2018  f/u ov/Monica Mora re:? Lupus pneumonitis on   prednisone 10 x 2  Chief Complaint  Patient presents with  . Follow-up    Her breathing is doing well. She had to park a long distance and walk up stairs for her visit today, and was able to do this with no problems. She is using her proair 3 x per wk on average.    Dyspnea:  MMRC1 = can walk nl pace, flat grade, can't hurry or go uphills or steps s sob  - all over home [employer] sev days prior to OV  s need  saba or getting sob  Cough: none Sleep: one pillow  SABA use:  3 x weekly  rec No change rx    04/16/2018  f/u ov/Monica Mora re:  Inflammatory as dz  with pos ana but neg dna on pred 20 mg daily  Chief Complaint  Patient presents with  . Follow-up    Breathing seems to be doing well. She notices some wheezing at night when she lies down. She states she does not wish to begin on nocturnal o2.   Dyspnea:  MMRC1 = can walk nl pace, flat grade, can't hurry or go uphills or steps s sob   Cough:  Gone but some noct wheeze  Assoc with sensation of  pnds   SABA use: alb once a day  02: does not have it  / flatly refuses to consider using it  rec When you return from [facility] reduce prednisone 10 mg to one daily  For drainage / throat tickle try take CHLORPHENIRAMINE  4 mg - take one every 4 hours as needed   04/29/2018 acute extended ov/Monica Mora re: hemoptysis on coumadin for afibon pred 10 mg daily  Chief Complaint  Patient presents with  . Acute Visit    Reports pink tinged/bloody sputum when coughing, increased wheezing, and hoarse x 4 days.   continued to be sob x 50 ft in [place] some better with hfa Slept on cold train back to [place]  04/27/18 then 04/28/18 started coughing / bloody mucus no purulence maybe 2 tbsp total No change in sob / no epistaxis or other bleeding   No obvious day to day or daytime variability or assoc excess/ purulent sputum or mucus plugs or  cp or chest tightness, subjective wheeze or overt sinus or hb symptoms.   Sleeping ok  without nocturnal  or early  am exacerbation  of respiratory  c/o's or need for noct saba. Also denies any obvious fluctuation of symptoms with weather or environmental changes or other aggravating or alleviating factors except as outlined above   No unusual exposure hx or h/o childhood pna/ asthma or knowledge of premature birth.  Current Allergies, Complete Past Medical History, Past Surgical History, Family History, and Social History were reviewed in [employer] record.  ROS  The following are not active complaints unless bolded Hoarseness, sore throat, dysphagia, dental problems, itching, sneezing,  nasal congestion or discharge of excess mucus or purulent secretions, ear ache,   fever, chills, sweats, unintended wt loss or wt gain, classically pleuritic or exertional cp,  orthopnea pnd or arm/hand swelling  or leg swelling, presyncope, palpitations, abdominal pain, anorexia, nausea, vomiting, diarrhea  or change in bowel habits or change in bladder habits, change in stools or change in urine, dysuria, hematuria,  rash, arthralgias, visual complaints, headache, numbness, weakness or ataxia or problems with walking or coordination,  change in mood or  memory.        Current Meds  Medication Sig  . albuterol (PROAIR HFA) 108 (90 Base) MCG/ACT inhaler Inhale 2 puffs into the lungs every 6 (six) hours as needed for wheezing or shortness of breath.  . brimonidine (ALPHAGAN) 0.2 % ophthalmic solution Place 1 drop into the left eye 3 (three) times daily.  . chlorpheniramine (CHLOR-TRIMETON) 4 MG tablet Take 4 mg by mouth daily.  . Cholecalciferol (VITAMIN D) 1000 UNITS capsule Take 1,000 Units by mouth daily.    . dorzolamide-timolol (COSOPT) 22.3-6.8 MG/ML ophthalmic solution Place 1 drop into both eyes 2 (two) times daily.   . famotidine (PEPCID) 20 MG tablet Take 1 tablet (20 mg total) by mouth at bedtime.  . furosemide (LASIX) 20 MG tablet Take 1 tablet (20 mg total) by mouth daily.  .  hydrochlorothiazide (MICROZIDE) 12.5 MG capsule TAKE ONE CAPSULE BY MOUTH EVERY DAY (Patient taking differently: TAKE 12.5 MG BY MOUTH EVERY DAY)  . levothyroxine (SYNTHROID, LEVOTHROID) 75 MCG tablet Take 1 tablet (75 mcg total) by mouth daily.  . pantoprazole (PROTONIX) 40 MG tablet Take 1 tablet (40 mg total) by mouth daily. Take 30-60 min before first meal of the day  . pilocarpine (PILOCAR) 1 % ophthalmic solution Place 1 drop into the left eye 3 (three) times daily.  . predniSONE (DELTASONE) 10 MG tablet 20mg  daily until next OV, then 10mg  daily  . telmisartan (MICARDIS) 80 MG tablet Take 1 tablet (80 mg total) by mouth daily.  . TOPROL XL 50 MG 24 hr tablet TAKE 1 TABLET BY MOUTH DAILY. TAKE WITH OR IMMEDIATELY FOLLOWING A MEAL (Patient taking differently: Take 50 mg by mouth daily. )  . Travoprost, BAK Free, (TRAVATAN) 0.004 % SOLN ophthalmic solution Place 1 drop into both eyes at bedtime.  . vitamin B-12 (CYANOCOBALAMIN) 500 MCG tablet Take 500 mcg by mouth daily.  Marland Kitchen warfarin (COUMADIN) 5 MG tablet Take as directed by coumadin clinic (Patient taking differently: Take 2.5-5 mg by mouth See admin instructions. Take 5 mg by mouth daily on Sunday, Tuesday, Thursday and Saturday. Take 2.5 mg by mouth daily on all other days.)                        Objective:   Physical Exam  Pleasant bf nad   04/29/2018      21 1 04/16/2018  212 03/05/2018       205  02/13/2018       214  01/01/2018         207  12/17/2017       209   11/21/17 207 lb (93.9 kg)  11/20/17 207 lb 1.9 oz (93.9 kg)  11/19/17 210 lb (95.3 kg)    Vital signs reviewed - Note on arrival 02 sats  97% on RA             HEENT: nl   turbinates bilaterally, and oropharynx. Nl external ear canals without cough reflex- top dentures/ lower partials   NECK :  without JVD/Nodes/TM/ nl carotid upstrokes bilaterally   LUNGS: no acc muscle use,  Nl contour chest with minimal insp/exp rhonchi and no localized wheeze or  bronchial changes    CV:  RRR  no s3 or murmur  - mild  increase in P2-  no edema   ABD:  soft and nontender with nl inspiratory excursion in the supine position. No bruits or organomegaly appreciated, bowel sounds nl  MS:  Nl gait/ ext warm without deformities, calf tenderness, cyanosis or clubbing No obvious joint restrictions   SKIN: warm and dry without lesions    NEURO:  alert, approp, nl sensorium with  no motor or cerebellar deficits apparent.       CXR PA and Lateral:   04/29/2018 :    I personally reviewed images and agree with radiology impression as follows:   No acute changes   Labs ordered/ reviewed:       Lab Results  Component Value Date   WBC 19.4 Repeated and verified X2. (HH) 04/29/2018   HGB 15.6 (H) 04/29/2018   HCT 45.6 04/29/2018   MCV 95.7 04/29/2018   PLT 234.0 04/29/2018         Lab Results  Component Value Date   ESRSEDRATE 52 (H) 04/29/2018   ESRSEDRATE 18 03/05/2018   ESRSEDRATE 30 02/13/2018              Lab Results  Component Value Date   INR 3.3 (H) 04/29/2018   INR 2.9 04/17/2018   INR 1.8 (A) 04/02/2018   PROTIME 18.1 03/21/2009     Assessment:

## 2018-04-29 NOTE — Patient Instructions (Addendum)
Stop coumadin until no bleeding at all for 3 straight days then resume previous dose  Please remember to go to the lab and x-ray department downstairs in the basement  for your tests - we will call you with the results when they are available.     Please schedule a follow up office visit in 4 weeks, sooner if needed  - add ESR back up on 10 mg pred so rec increased to 20 mg daily and refer to Rheum

## 2018-04-30 ENCOUNTER — Telehealth: Payer: Self-pay | Admitting: Internal Medicine

## 2018-04-30 ENCOUNTER — Ambulatory Visit (INDEPENDENT_AMBULATORY_CARE_PROVIDER_SITE_OTHER): Payer: Medicare Other

## 2018-04-30 ENCOUNTER — Encounter: Payer: Self-pay | Admitting: Internal Medicine

## 2018-04-30 DIAGNOSIS — Z7901 Long term (current) use of anticoagulants: Secondary | ICD-10-CM

## 2018-04-30 DIAGNOSIS — R768 Other specified abnormal immunological findings in serum: Secondary | ICD-10-CM

## 2018-04-30 DIAGNOSIS — I4891 Unspecified atrial fibrillation: Secondary | ICD-10-CM

## 2018-04-30 MED ORDER — PREDNISONE 10 MG PO TABS: 20.0000 mg | ORAL_TABLET | Freq: Every day | ORAL | 5 refills | Status: DC

## 2018-04-30 NOTE — Telephone Encounter (Signed)
Attempted to call pt. I did not receive an answer. I have left a message for pt to return our call.  

## 2018-04-30 NOTE — Telephone Encounter (Signed)
Pt is calling back [phone]

## 2018-04-30 NOTE — Telephone Encounter (Signed)
Spoke with patient. She stated that she is not having any chest pain. She is not sure where that information came from. She wants to personally speak with Dr. Melvyn Novas because she has questions that she felt he did not address yesterday. Attempted to get the questions from patient but she refused. Only wants to speak with Dr. Melvyn Novas. Advised her that Dr. Melvyn Novas is in the middle of clinic, she verbalized understanding.   Per patient, she can be reached at [phone].   MW, please advise. Thanks!

## 2018-04-30 NOTE — Telephone Encounter (Signed)
Left message for patient to call back. Will place the order for the CT scan once she is aware.

## 2018-04-30 NOTE — Telephone Encounter (Signed)
Spoke with pt. She is aware of MW's response. States that she is just having a problem with a pain in her chest under breast. Wants something to be done about this. She doesn't feel like a Rheumatologist can do that.  MW - please advise. Thanks.

## 2018-04-30 NOTE — Telephone Encounter (Signed)
1) get her in for qualifying 02 ?POC eligible  2) rheum consult dr Melissa Noon group

## 2018-04-30 NOTE — Telephone Encounter (Signed)
Needs CT with contrast asap then we'll go from there - if getting worse in meatime go to ER

## 2018-04-30 NOTE — Patient Instructions (Addendum)
Description   Hold Warfarin dosage per Dr Gustavus Bryant instructions x 3 days, then start taking 1/2 tablet daily except 1 tablet on Tuesdays.  Recheck INR in 1 week given pt on Prednisone 10mg  QD maintenance dosage and lower INR range no higher than 2.0 per Dr Melvyn Novas. Call us any medication changes or concerns to [phone]

## 2018-04-30 NOTE — Progress Notes (Signed)
Spoke with pt and notified of results per Dr. Melvyn Novas. She is not wanting rheum referral b/c "there is nothing wrong with me"- does not understand why she needs referral and wants further explanation. Please advise

## 2018-04-30 NOTE — Assessment & Plan Note (Signed)
12/31/17 HRCT Chest 1. Masslike consolidation in the right lower lobe measuring 7.3 x 6.5 cm with associated volume loss and distortion of the right major fissure. There has been persistent consolidation in this location on multiple recent chest radiographs back to 05/22/2017. In addition, there was a morphologically suspicious irregular 2.7 cm pulmonary nodule in the anterior right lower lobe on the 11/08/2008 chest CT. While nonspecific, these findings are highly concerning for right lower lobe lung adenocarcinoma. 2. Numerous subsolid and solid pulmonary nodules in both lungs, worrisome for pulmonary metastases. 3. Patchy interlobular septal thickening in both lungs, right greater than left, worrisome for lymphangitic spread of tumor. 4. New nonspecific right paratracheal, subcarinal and bilateral hilar lymphadenopathy. Nodal metastases not excluded. - FOB  01/21/18  > atypical cells lining bronchi c/w  - Repeat FOB 02/11/18 Dr Byrum  >  Neg ebus/ neg TBBx  - Quant TB 02/13/18 neg  - Collagen vasc screen 02/13/18 :  ESR 30/ ANA pos  1:640 homogeneous  - HSP 02/13/18 neg  - Rx prednisone 02/13/18 with borderline hypoxemia > all symptoms much better  03/05/2018 with improved aeration R base and anti dna neg   Acute ov 04/29/2018  - assoc hemoptysis on coumadin with INR 3.3 and esr up to 52 on pred 10 >  Resume pred 20 mg daily and refer to rheum/ hold coumadin until no blood x 3 days then resume with target INR 2.0 no higher   Comment: Despite neg anti dna I'm concerned this is some form of collagen vasc dz and very unlikely to be be neoplastic but will repeat a CT chest if not improving back on the 20 mg daily or coughs up any more blood at a lower INR.  I had an extended discussion with the patient reviewing all relevant studies completed to date and  lasting 15 to 20 minutes of a 25 minute acute office visit    Each maintenance medication was reviewed in detail including most importantly the  difference between maintenance and prns and under what circumstances the prns are to be triggered using an action plan format that is not reflected in the computer generated alphabetically organized AVS.    Please see AVS for specific instructions unique to this visit that I personally wrote and verbalized to the the pt in detail and then reviewed with pt  by my nurse highlighting any  changes in therapy recommended at today's visit to their plan of care.   

## 2018-04-30 NOTE — Telephone Encounter (Signed)
Spoke with the pt and notified of her lab results and recs She states that rheum referral was never mentioned before and she does not understand why she needs this  She states "there's nothing wrong with me" Wants further explanation on why ref needed, thanks

## 2018-04-30 NOTE — Telephone Encounter (Signed)
Should probably have been done when dx with pericarditis but the problem now is that the prednisone is working but is not a good long term way to treat her underlying problems so we normally get an opinion from rheum as to what other options we have and I would really like it if she would go get this done asap but won't force her if she declines

## 2018-05-04 ENCOUNTER — Telehealth: Payer: Self-pay | Admitting: Internal Medicine

## 2018-05-04 DIAGNOSIS — R768 Other specified abnormal immunological findings in serum: Secondary | ICD-10-CM | POA: Insufficient documentation

## 2018-05-04 MED ORDER — AZITHROMYCIN 250 MG PO TABS: 250.0000 mg | ORAL_TABLET | ORAL | 0 refills | Status: DC

## 2018-05-04 NOTE — Telephone Encounter (Signed)
Called and spoke with patient she states that she has a cold. She has been coughing up mucus for 3 days. No color. Denies fever, chest pain, body aches. No sneezing. No other symptoms present. Patient is wanting to take something for this.    MW please advise, thank you.

## 2018-05-04 NOTE — Telephone Encounter (Signed)
Called patient on both numbers provided. Unable to reach left message to give [place] a call back.

## 2018-05-04 NOTE — Telephone Encounter (Signed)
Spoke with the pt and scheduled walk for tomorrow at 3 pm  I have placed rheum referral also

## 2018-05-04 NOTE — Telephone Encounter (Signed)
Called and spoke with patient, advised of MW response. Patient verbalized understanding. Medication sent.

## 2018-05-04 NOTE — Telephone Encounter (Signed)
z-pak 

## 2018-05-05 ENCOUNTER — Other Ambulatory Visit: Payer: Self-pay | Admitting: Internal Medicine

## 2018-05-05 ENCOUNTER — Ambulatory Visit: Payer: Medicare Other

## 2018-05-08 ENCOUNTER — Ambulatory Visit: Payer: Medicare Other | Admitting: Pharmacist

## 2018-05-08 DIAGNOSIS — Z7901 Long term (current) use of anticoagulants: Secondary | ICD-10-CM | POA: Diagnosis not present

## 2018-05-08 DIAGNOSIS — I4891 Unspecified atrial fibrillation: Secondary | ICD-10-CM | POA: Diagnosis not present

## 2018-05-08 LAB — POCT INR: INR: 1.1 — AB (ref 2.0–3.0)

## 2018-05-08 NOTE — Patient Instructions (Signed)
Description   Take 1 tablet today then go back to taking 1 tablet on Tuesdays and Saturdays, 1/2 tablet all other days. Recheck INR in 1 week given pt on Prednisone 20mg  QD maintenance dosage and lower INR range no higher than 2.0 per Dr Melvyn Novas. Call us any medication changes or concerns to [phone]

## 2018-05-18 ENCOUNTER — Telehealth: Payer: Self-pay | Admitting: Endocrinology

## 2018-05-18 NOTE — Telephone Encounter (Signed)
Patient has been without thyroid medication (Levothyroxine) for 5 days because she left town without her medication. She is now back on her medication.. She just wants Dr. Loanne Drilling to be aware that she had been without her medication for 5 days.Please call patient at ph# [phone] to advise if that is a problem. (does she need a sooner appointment than October).

## 2018-05-18 NOTE — Telephone Encounter (Signed)
No need.  All you need to do is to take qd as rx'ed.  I'll see you next time.

## 2018-05-19 ENCOUNTER — Ambulatory Visit: Payer: Medicare Other

## 2018-05-19 DIAGNOSIS — Z7901 Long term (current) use of anticoagulants: Secondary | ICD-10-CM | POA: Diagnosis not present

## 2018-05-19 DIAGNOSIS — I4891 Unspecified atrial fibrillation: Secondary | ICD-10-CM | POA: Diagnosis not present

## 2018-05-19 LAB — POCT INR: INR: 1.6 — AB (ref 2.0–3.0)

## 2018-05-19 NOTE — Telephone Encounter (Signed)
I notified patient to just take medication as RX'ed.

## 2018-05-19 NOTE — Patient Instructions (Signed)
Description    Take 1.5 tablets today, then start taking 1/2 tablet daily except 1 tablet on Tuesdays, Thursdays, and Saturdays. Recheck INR in 1 week given pt on Prednisone 20mg  QD maintenance dosage and lower INR range no higher than 2.0 per Dr Melvyn Novas. Call us any medication changes or concerns to [phone]

## 2018-05-22 ENCOUNTER — Other Ambulatory Visit: Payer: Self-pay | Admitting: Internal Medicine

## 2018-05-24 ENCOUNTER — Other Ambulatory Visit: Payer: Self-pay | Admitting: Internal Medicine

## 2018-05-26 ENCOUNTER — Ambulatory Visit: Payer: Medicare Other

## 2018-05-27 ENCOUNTER — Ambulatory Visit: Payer: Medicare Other

## 2018-05-27 DIAGNOSIS — Z7901 Long term (current) use of anticoagulants: Secondary | ICD-10-CM

## 2018-05-27 DIAGNOSIS — I4891 Unspecified atrial fibrillation: Secondary | ICD-10-CM

## 2018-05-27 LAB — POCT INR: INR: 2.9 (ref 2.0–3.0)

## 2018-05-27 NOTE — Patient Instructions (Signed)
Description   Do not take your coumadin today. Tomorrow continue taking 1/2 tablet daily except 1 tablet on Tuesdays, Thursdays, and Saturdays. Recheck INR in 1 and 1/2 weeks given pt on Prednisone 20mg  QD maintenance dosage and lower INR range no higher than 2.0 per Dr Melvyn Novas. Call us any medication changes or concerns to [phone]

## 2018-05-29 ENCOUNTER — Ambulatory Visit: Payer: Medicare Other

## 2018-05-29 ENCOUNTER — Ambulatory Visit: Payer: Medicare Other | Admitting: Internal Medicine

## 2018-05-29 ENCOUNTER — Encounter: Payer: Self-pay | Admitting: Internal Medicine

## 2018-05-29 VITALS — BP 130/82 | HR 65 | Ht 66.0 in | Wt 214.4 lb

## 2018-05-29 DIAGNOSIS — I2729 Other secondary pulmonary hypertension: Secondary | ICD-10-CM

## 2018-05-29 DIAGNOSIS — R911 Solitary pulmonary nodule: Secondary | ICD-10-CM

## 2018-05-29 DIAGNOSIS — R918 Other nonspecific abnormal finding of lung field: Secondary | ICD-10-CM | POA: Diagnosis not present

## 2018-05-29 DIAGNOSIS — J9611 Chronic respiratory failure with hypoxia: Secondary | ICD-10-CM | POA: Diagnosis not present

## 2018-05-29 NOTE — Patient Instructions (Addendum)
Please see patient coordinator before you leave today  to schedule CT chest asap and I will call you with results  02 2lpm walking no need at rest  - we will order you ONO RA to see if need it then too   Please schedule a follow up office visit in 6 weeks, call sooner if needed

## 2018-05-29 NOTE — Progress Notes (Signed)
Subjective:     Patient ID: Monica Mora, female   DOB: Sep 06, 1935,    MRN: [id number]    Brief patient profile:  41 yobf  quit smoking 1980 with new onset doe x spring 2017 with neg cards w/u 06/10/17 (neg lexiscan) so referred to pulmonary clinic 11/21/2017 by Dr   Jenny Reichmann with h/o PAF on coumadin and also s/p remote pericardial window 2002 in   [place]  for "constrictive" pericarditis s specific dx   and never saw rheumatologist as was just in [place] visiting and the window solved the problem but has had persistent non-specific  changes on CT chest in [place] since 11/06/2004 first detected here     History of Present Illness  11/21/2017 1st [facility] office visit/ Monica Mora   Chief Complaint  Patient presents with  . Pulmonary Consult    Referred by Dr. Cathlean Cower.  Pt c/o SOB for the past year. She gets winded walking "a distance" and walking up stairs. She uses her albuterol inhaler 1-2 x per day on average. She has occ PND and non prod cough.     1st noted spring 2017 trip to [place] more doe and then steps at home more difficult x  6 months and variable has to stop at top  Also Has to walk slower when shopping  Notes sometime discomfort episgastric with exertion only not related to eating x one year Sleep 2 pillows  Started needing albuterol one month prior to OV  Cough started  One year prior to [employer]   "it's due to drainage" but note it is dry and only daytime Treated for "pna" in Dec 2018 but never had fever/ purulent sputum or any acute illness to correlate with "pna on cxr" by radiology and no better p rx as cap Hoarseness, sore throat= scratchy/pnds, dysphagia, dental problems, itching, sneezing,  nasal congestion o  rec  Pantoprazole (protonix) 40 mg   Take  30-60 min before first meal of the day and Pepcid (famotidine)  20 mg one @  bedtime until return to office - this is the best way to tell whether stomach acid is contributing to your problem.   GERD  stop lisinopril  And start  telmisartan 80  mg one daily   Please schedule a follow up office visit in 4 weeks, sooner if needed  with all medications /inhalers/ solutions in hand so we can verify exactly what you are taking. This includes all medications from all doctors and over the counters - add 6 m walk on return    12/17/2017  f/u ov/Monica Mora re: chronic doe/ cough ? acei  Chief Complaint  Patient presents with  . Follow-up    6MW test done today. Breathing is unchanged. Her cough is gone. She uses albuterol inhaler once per wk on average.   Dyspnea:  Sometimes on steps / if goes slow does fine =  MMRC1 = can walk nl pace, flat grade, can't hurry or go uphills or steps s sob   Cough: denies  Sleep: 2 pillows  SABA use:  Rare rec No change in meds HRCT chest  12/31/16> c/w lung ca    01/01/2018  f/u ov/Monica Mora re:  Lung mass/ doe  Chief Complaint  Patient presents with  . Follow-up    Per pt, follow up after CT scan. Breathing has been about the same since last visit.   Dyspnea:  MMRC2 = can't walk a nl pace on a flat grade s sob but  does fine slow and flat    Cough: gone since stopped lisinopril  Sleep: 2 pillows  SABA use:  Not using now  rec PET c/w ca > neg fob 01/21/18   Repeat FOB 02/11/18 Dr Lamonte Sakai  >  Neg ebus/ neg TBBx     02/13/2018  f/u ov/Monica Mora re: doe/ pulmonary infiltrates  Chief Complaint  Patient presents with  . Follow-up    Breathing progressively worse. She is using her proair at least 4 x per day. She also c/o non prod cough.   Dyspnea:  50 ft  Cough: more since procedure but dry   Sleep: ok  SABA use: reports does help for up to 2 hours   rec Prednisone 3m  X 4 each am with bfast until feeling better then reduce to 3 daily for a week then 2 daily   Only use your albuterol as a rescue medication  Work on inhaler technique:   - add  Lasix 20 mg daily / repeat echo  02/18/18 >>> C/w PH  - ANA pos 1:640 homogenous pattern    03/05/2018  f/u ov/Monica Mora re:? Lupus pneumonitis on   prednisone 10 x 2  Chief Complaint  Patient presents with  . Follow-up    Her breathing is doing well. She had to park a long distance and walk up stairs for her visit today, and was able to do this with no problems. She is using her proair 3 x per wk on average.    Dyspnea:  MMRC1 = can walk nl pace, flat grade, can't hurry or go uphills or steps s sob  - all over home [employer] sev days prior to OV  s need  saba or getting sob  Cough: none Sleep: one pillow  SABA use:  3 x weekly  rec No change rx    04/16/2018  f/u ov/Monica Mora re:  Inflammatory as dz  with pos ana but neg dna on pred 20 mg daily  Chief Complaint  Patient presents with  . Follow-up    Breathing seems to be doing well. She notices some wheezing at night when she lies down. She states she does not wish to begin on nocturnal o2.   Dyspnea:  MMRC1 = can walk nl pace, flat grade, can't hurry or go uphills or steps s sob   Cough:  Gone but some noct wheeze  Assoc with sensation of  pnds   SABA use: alb once a day  02: does not have it  / flatly refuses to consider using it  [facility]  For drainage / throat tickle try take CHLORPHENIRAMINE  4 mg - take one every 4 hours as needed   04/29/2018 acute extended ov/Monica Mora re: hemoptysis on coumadin for afib/ on pred 10 mg daily  Chief Complaint  Patient presents with  . Acute Visit    Reports pink tinged/bloody sputum when coughing, increased wheezing, and hoarse x 4 days.   continued [place] be sob [place] 04/[place]/18 started coughing / bloody mucus no purulence maybe 2 tbsp total No change in sob / no epistaxis or other bleeding rec Stop coumadin until no bleeding at all for 3 straight days then resume previous dose Please remember to go to the lab and x-ray department downstairs in the basement  for your tests -  we will call you with the  results when they are available. Please schedule a follow up office visit in 4 weeks, sooner if needed  - add ESR back up on 10 mg pred so rec increased to 20 mg daily and refer to Rheum > refused to go "feel too good, I don't have joint problems"    05/29/2018  f/u ov/Monica Mora re: prednisone 20 mg per day  Chief Complaint  Patient presents with  . Follow-up    Pt states she feels great today and she denies any co's.   Dyspnea:  MMRC3 = can't walk 100 yards even at a slow pace at a flat grade s stopping due to sob   Cough: no  SABA use: x 2 -3 daily seems to help 02: none  No further coughing up blood    No obvious day to day or daytime variability or assoc excess/ purulent sputum or mucus plugs or hemoptysis or cp or chest tightness, subjective wheeze or overt sinus or hb symptoms.   Sleeping: 2 pillows  without nocturnal  or early am exacerbation  of respiratory  c/o's or need for noct saba. Also denies any obvious fluctuation of symptoms with weather or environmental changes or other aggravating or alleviating factors except as outlined above   No unusual exposure hx or h/o childhood pna/ asthma or knowledge of premature birth.  Current Allergies, Complete Past Medical History, Past Surgical History, Family History, and Social History were reviewed in [employer] record.  ROS  The following are not active complaints unless bolded Hoarseness, sore throat, dysphagia, dental problems, itching, sneezing,  nasal congestion or discharge of excess mucus or purulent secretions, ear ache,   fever, chills, sweats, unintended wt loss or wt gain, classically pleuritic or exertional cp,  orthopnea pnd or arm/hand swelling  or leg swelling, presyncope, palpitations, abdominal pain, anorexia, nausea, vomiting, diarrhea  or change in bowel habits or change in bladder habits, change in stools or change in urine, dysuria, hematuria,  rash, arthralgias, visual complaints, headache,  numbness, weakness or ataxia or problems with walking or coordination,  change in mood or  memory.        Current Meds  Medication Sig  . albuterol (PROAIR HFA) 108 (90 Base) MCG/ACT inhaler Inhale 2 puffs into the lungs every 6 (six) hours as needed for wheezing or shortness of breath.  . brimonidine (ALPHAGAN) 0.2 % ophthalmic solution Place 1 drop into the left eye 3 (three) times daily.  . chlorpheniramine (CHLOR-TRIMETON) 4 MG tablet Take 4 mg by mouth daily.  . Cholecalciferol (VITAMIN D) 1000 UNITS capsule Take 1,000 Units by mouth daily.    . dorzolamide-timolol (COSOPT) 22.3-6.8 MG/ML ophthalmic solution Place 1 drop into both eyes 2 (two) times daily.   . famotidine (PEPCID) 20 MG tablet TAKE 1 TABLET BY MOUTH EVERYDAY AT BEDTIME  . furosemide (LASIX) 20 MG tablet Take 1 tablet (20 mg total) by mouth daily.  . hydrochlorothiazide (MICROZIDE) 12.5 MG capsule TAKE ONE CAPSULE BY MOUTH EVERY DAY (Patient taking differently: TAKE 12.5 MG BY MOUTH EVERY DAY)  . levothyroxine (SYNTHROID, LEVOTHROID) 75 MCG tablet Take 1 tablet (75 mcg total) by mouth daily.  . pantoprazole (PROTONIX) 40 MG tablet TAKE 1 TABLET (40 MG TOTAL) BY MOUTH DAILY. TAKE 30-60 MIN BEFORE FIRST MEAL OF THE DAY  . pilocarpine (PILOCAR) 1 % ophthalmic solution Place 1 drop into the left eye 3 (three) times daily.  . predniSONE (DELTASONE) 10 MG tablet Take  2 tablets (20 mg total) by mouth daily. 81m daily until next OV, then 110mdaily  . PROAIR HFA 108 (90 Base) MCG/ACT inhaler TAKE 2 PUFFS BY MOUTH EVERY 6 HOURS AS NEEDED FOR WHEEZE OR SHORTNESS OF BREATH  . telmisartan (MICARDIS) 80 MG tablet Take 1 tablet (80 mg total) by mouth daily.  . TOPROL XL 50 MG 24 hr tablet TAKE 1 TABLET BY MOUTH EVERY DAY WITH OR IMMEDIATELY FOLLOWING A MEAL  . Travoprost, BAK Free, (TRAVATAN) 0.004 % SOLN ophthalmic solution Place 1 drop into both eyes at bedtime.  . vitamin B-12 (CYANOCOBALAMIN) 500 MCG tablet Take 500 mcg by mouth  daily.  . Marland Kitchenarfarin (COUMADIN) 5 MG tablet Take as directed by coumadin clinic (Patient taking differently: Take 2.5-5 mg by mouth See admin instructions. Take 5 mg by mouth daily on _0 9   11/21/17 207 lb (93.9 kg)  11/20/17 207 lb 1.9 oz (93.9 kg)  11/19/17 210 lb (95.3 kg)   Vital signs reviewed - Note on arrival 02 sats  90% on RA        HEENT: nl turbinates bilaterally, and oropharynx. Nl external ear canals without cough reflex - top dentures/ lower partials    NECK :  without JVD/Nodes/TM/ nl carotid upstrokes bilaterally   LUNGS: no acc muscle use,  Nl contour chest which is clear to A and P bilaterally without cough on insp or exp maneuvers   CV:  RRR  no s3 or murmur or increase in P2, and no edema   ABD:  soft and nontender with nl inspiratory excursion in the supine position. No bruits or organomegaly appreciated, bowel sounds nl  MS:  Nl gait/ ext warm without deformities, calf tenderness, cyanosis or clubbing No obvious joint restrictions   SKIN: warm and dry without lesions    NEURO:  alert, approp, nl sensorium with  no motor or cerebellar deficits apparent.      Lab Results  Component Value Date   ESRSEDRATE 52 (H) 04/29/2018   ESRSEDRATE 18 03/05/2018   ESRSEDRATE 30 02/13/2018                         Assessment:

## 2018-05-30 ENCOUNTER — Encounter: Payer: Self-pay | Admitting: Internal Medicine

## 2018-05-30 DIAGNOSIS — J9611 Chronic respiratory failure with hypoxia: Secondary | ICD-10-CM | POA: Insufficient documentation

## 2018-05-30 NOTE — Assessment & Plan Note (Signed)
12/31/17 HRCT Chest 1. Masslike consolidation in the right lower lobe measuring 7.3 x 6.5 cm with associated volume loss and distortion of the right major fissure. There has been persistent consolidation in this location on multiple recent chest radiographs back to 05/22/2017. In addition, there was a morphologically suspicious irregular 2.7 cm pulmonary nodule in the anterior right lower lobe on the 11/08/2008 chest CT. While nonspecific, these findings are highly concerning for right lower lobe lung adenocarcinoma. 2. Numerous subsolid and solid pulmonary nodules in both lungs, worrisome for pulmonary metastases. 3. Patchy interlobular septal thickening in both lungs, right greater than left, worrisome for lymphangitic spread of tumor. 4. New nonspecific right paratracheal, subcarinal and bilateral hilar lymphadenopathy. Nodal metastases not excluded. - FOB  01/21/18  > atypical cells lining bronchi c/w  - Repeat FOB 02/11/18 Dr Byrum  >  Neg ebus/ neg TBBx  - Quant TB 02/13/18 neg  - Collagen vasc screen 02/13/18 :  ESR 30/ ANA pos  1:640 homogeneous  - HSP 02/13/18 neg  - Rx prednisone 02/13/18 with borderline hypoxemia > all symptoms much better  03/05/2018 with improved aeration R base and anti dna neg  - assoc hemoptysis on coumadin with INR 3.3 and esr up to 52 on pred 10 >  Resume pred 20 mg daily and refer to rheum/ hold coumadin until no blood x 3 days then resume with target INR 2.0 no higher     She clearly feels better on pred 20 mg daily but the question remains whether there is underlying collagen vasc process vs malignancy with assoc inflammatory systemic effects > best way to know short of open lung bx = repeat ct chest which she did agree to after much discussion.  In meantime, The goal with a chronic steroid dependent illness is always arriving at the lowest effective dose that controls the disease/symptoms and not accepting a set "formula" which is based on statistics or  guidelines that don't always take into account patient  variability or the natural hx of the dz in every individual patient, which may well vary over time.  For now therefore I recommend the patient maintain  20 a/w 10 mg per day for now       

## 2018-05-30 NOTE — Assessment & Plan Note (Addendum)
05/29/2018 :Patient Saturations on Room Air at Rest = 93%>>  while Ambulating = 88%>>>  2 Liters of oxygen while Ambulating = 95%   rec 05/29/2018 2lpm walking/ check ono RA for noc 02

## 2018-05-30 NOTE — Assessment & Plan Note (Signed)
See echo 02/18/18  - Normal LV size with EF 55-60%. The RV was moderately dilated with   mildly decreased systolic function. D-shaped interventricular   septum was suggestive of RV pressure/volume overload. Severe   pulmonary hypertension. ONO RA 03/08/2018 >>> desats but pt declined 02  - 04/16/2018   Walked RA  2 laps @ 185 ft each stopped due to  desats to 85 nl pace/ no symptoms > declined amb 02   - 05/29/2018 agreed to amb 02 2lpm and check ono RA > see chronic resp failure     I had an extended discussion with the patient reviewing all relevant studies completed to date and  lasting 15 to 20 minutes of a 25 minute visit    Each maintenance medication was reviewed in detail including most importantly the difference between maintenance and prns and under what circumstances the prns are to be triggered using an action plan format that is not reflected in the computer generated alphabetically organized AVS.    Please see AVS for specific instructions unique to this visit that I personally wrote and verbalized to the the pt in detail and then reviewed with pt  by my nurse highlighting any  changes in therapy recommended at today's visit to their plan of care.

## 2018-06-08 ENCOUNTER — Encounter: Payer: Self-pay | Admitting: Internal Medicine

## 2018-06-08 DIAGNOSIS — R0902 Hypoxemia: Secondary | ICD-10-CM | POA: Diagnosis not present

## 2018-06-08 DIAGNOSIS — J449 Chronic obstructive pulmonary disease, unspecified: Secondary | ICD-10-CM | POA: Diagnosis not present

## 2018-06-09 ENCOUNTER — Ambulatory Visit: Payer: Medicare Other

## 2018-06-09 ENCOUNTER — Inpatient Hospital Stay: Admission: RE | Admit: 2018-06-09 | Payer: Medicare Other | Source: Ambulatory Visit

## 2018-06-09 DIAGNOSIS — Z7901 Long term (current) use of anticoagulants: Secondary | ICD-10-CM | POA: Diagnosis not present

## 2018-06-09 DIAGNOSIS — I4891 Unspecified atrial fibrillation: Secondary | ICD-10-CM | POA: Diagnosis not present

## 2018-06-09 LAB — POCT INR: INR: 4.2 — AB (ref 2.0–3.0)

## 2018-06-09 NOTE — Patient Instructions (Signed)
Do not take your coumadin today or tomorrow, then resume continue taking 1/2 tablet daily except 1 tablet on Tuesdays, Thursdays, and Saturdays. Recheck INR in 2 weeks given pt on Prednisone 20mg  QD maintenance dosage and lower INR range no higher than 2.0 per Dr Melvyn Novas. Call us any medication changes or concerns to [phone]

## 2018-06-16 ENCOUNTER — Ambulatory Visit (INDEPENDENT_AMBULATORY_CARE_PROVIDER_SITE_OTHER)
Admission: RE | Admit: 2018-06-16 | Discharge: 2018-06-16 | Disposition: A | Discharge status: Home / Self Care | Payer: Medicare Other | Source: Ambulatory Visit | Attending: Internal Medicine | Admitting: Internal Medicine

## 2018-06-16 DIAGNOSIS — R911 Solitary pulmonary nodule: Secondary | ICD-10-CM

## 2018-06-16 DIAGNOSIS — R918 Other nonspecific abnormal finding of lung field: Secondary | ICD-10-CM

## 2018-06-17 ENCOUNTER — Other Ambulatory Visit: Payer: Self-pay | Admitting: Internal Medicine

## 2018-06-17 ENCOUNTER — Telehealth: Payer: Self-pay | Admitting: Internal Medicine

## 2018-06-17 DIAGNOSIS — J9611 Chronic respiratory failure with hypoxia: Secondary | ICD-10-CM | POA: Diagnosis not present

## 2018-06-17 DIAGNOSIS — R768 Other specified abnormal immunological findings in serum: Secondary | ICD-10-CM

## 2018-06-17 NOTE — Progress Notes (Signed)
Spoke with pt and notified of results per Dr. Wert. Pt verbalized understanding and denied any questions. 

## 2018-06-17 NOTE — Telephone Encounter (Signed)
Discovered has no 02 yet and rheum unable to contact her for eval   rec Re do rheum consult again  I spoke to [employer] to get them to deliver 02 today and  Call me on my cell to explain why it's taken so long to provide 02

## 2018-06-17 NOTE — Telephone Encounter (Signed)
Spoke with the pt to give results of ct chest  She states wants to speak with MW

## 2018-06-17 NOTE — Telephone Encounter (Signed)
Order was sent for rheum referral again I am going to hold and call her tomorrow to make sure o2 was delivered

## 2018-06-18 ENCOUNTER — Telehealth: Payer: Self-pay | Admitting: Internal Medicine

## 2018-06-18 NOTE — Telephone Encounter (Signed)
Try again today with ambulation

## 2018-06-18 NOTE — Telephone Encounter (Signed)
Spoke with pt. She is aware of MW's recommendation. Nothing further was needed. 

## 2018-06-18 NOTE — Telephone Encounter (Signed)
Spoke with pt. States that she started on oxygen yesterday. Reports that after having the oxygen on for 10 minutes she started to have severe acid reflux. States, "It started backing up in my throat." She removed the nasal cannula and states that as soon as she did this, her acid reflux started subsiding. Pt was nervous to put the oxygen back on last night after this happened. She would like MW's recommendations.  MW - please advise. Thanks.

## 2018-06-18 NOTE — Telephone Encounter (Signed)
Received letter from [facility]- they are asking that the pt call to schedule the appt since they reached out to her many times before. I called and spoke with the pt and notified her she needs to call them to make appt at [phone].  She verbalized understanding. She states that she does have her o2 now. Nothing further needed.

## 2018-06-24 ENCOUNTER — Ambulatory Visit: Payer: Medicare Other | Admitting: *Deleted

## 2018-06-24 DIAGNOSIS — Z7901 Long term (current) use of anticoagulants: Secondary | ICD-10-CM

## 2018-06-24 DIAGNOSIS — I4891 Unspecified atrial fibrillation: Secondary | ICD-10-CM | POA: Diagnosis not present

## 2018-06-24 LAB — POCT INR: INR: 2.9 (ref 2.0–3.0)

## 2018-06-24 NOTE — Patient Instructions (Addendum)
  Description   Do not take your coumadin today then change dose to 1/2 tablet daily except 1 tablet on Tuesdays and Thursdays. Recheck INR in 2 weeks given pt on Prednisone 20mg  QD maintenance dosage and lower INR range no higher than 2.0 per Dr Melvyn Novas. Call us any medication changes or concerns to [phone]

## 2018-06-26 ENCOUNTER — Telehealth: Payer: Self-pay | Admitting: *Deleted

## 2018-06-26 NOTE — Telephone Encounter (Signed)
Pt called to inform us that after blowing her nose she had a blood streak on her tissue, pt denies that the nose is dripping or pouring blood and that it happened once just a few minutes ago and it is not happening right now. Advised pt that we don't have access to look in the nose and if any blood from the nose persists to have it checked by her PCP or Urgent Care or ER; and to keep an eye on this and not to manipulate the nose and call back with any other issues/questions.

## 2018-07-06 ENCOUNTER — Telehealth: Payer: Self-pay | Admitting: Internal Medicine

## 2018-07-06 NOTE — Telephone Encounter (Signed)
Nothing needed at this time.  

## 2018-07-07 DIAGNOSIS — R042 Hemoptysis: Secondary | ICD-10-CM | POA: Diagnosis not present

## 2018-07-07 DIAGNOSIS — R590 Localized enlarged lymph nodes: Secondary | ICD-10-CM | POA: Diagnosis not present

## 2018-07-07 DIAGNOSIS — R768 Other specified abnormal immunological findings in serum: Secondary | ICD-10-CM | POA: Diagnosis not present

## 2018-07-07 DIAGNOSIS — R9389 Abnormal findings on diagnostic imaging of other specified body structures: Secondary | ICD-10-CM | POA: Diagnosis not present

## 2018-07-08 ENCOUNTER — Ambulatory Visit: Payer: Medicare Other | Admitting: Internal Medicine

## 2018-07-09 ENCOUNTER — Ambulatory Visit: Payer: Medicare Other | Admitting: Pharmacist

## 2018-07-09 DIAGNOSIS — Z7901 Long term (current) use of anticoagulants: Secondary | ICD-10-CM | POA: Diagnosis not present

## 2018-07-09 DIAGNOSIS — I4891 Unspecified atrial fibrillation: Secondary | ICD-10-CM

## 2018-07-09 LAB — POCT INR: INR: 2.4 (ref 2.0–3.0)

## 2018-07-09 NOTE — Patient Instructions (Signed)
Do not take your coumadin today then continue dose of 1/2 tablet daily except 1 tablet on Tuesdays and Thursdays. Recheck INR in 2 weeks given pt on Prednisone 20mg  QD maintenance dosage and lower INR range no higher than 2.0 per Dr Melvyn Novas. Call us any medication changes or concerns to [phone]

## 2018-07-14 ENCOUNTER — Telehealth: Payer: Self-pay | Admitting: Internal Medicine

## 2018-07-14 ENCOUNTER — Ambulatory Visit (INDEPENDENT_AMBULATORY_CARE_PROVIDER_SITE_OTHER): Payer: Medicare Other | Admitting: Internal Medicine

## 2018-07-14 ENCOUNTER — Encounter: Payer: Self-pay | Admitting: Internal Medicine

## 2018-07-14 VITALS — BP 138/80 | HR 69 | Ht 66.0 in | Wt 216.8 lb

## 2018-07-14 DIAGNOSIS — R0609 Other forms of dyspnea: Secondary | ICD-10-CM | POA: Diagnosis not present

## 2018-07-14 DIAGNOSIS — R918 Other nonspecific abnormal finding of lung field: Secondary | ICD-10-CM

## 2018-07-14 DIAGNOSIS — J9611 Chronic respiratory failure with hypoxia: Secondary | ICD-10-CM | POA: Diagnosis not present

## 2018-07-14 MED ORDER — HYDROCHLOROTHIAZIDE 12.5 MG PO CAPS: 12.5000 mg | ORAL_CAPSULE | Freq: Every day | ORAL | 0 refills | Status: DC

## 2018-07-14 NOTE — Progress Notes (Signed)
Subjective:     Patient ID: Monica Mora, female   DOB: Sep 06, 1935,    MRN: [id number]    Brief patient profile:  41 yobf  quit smoking 1980 with new onset doe x spring 2017 with neg cards w/u 06/10/17 (neg lexiscan) so referred to pulmonary clinic 11/21/2017 by Dr   Jenny Reichmann with h/o PAF on coumadin and also s/p remote pericardial window 2002 in   [place]  for "constrictive" pericarditis s specific dx   and never saw rheumatologist as was just in [place] visiting and the window solved the problem but has had persistent non-specific  changes on CT chest in [place] since 11/06/2004 first detected here     History of Present Illness  11/21/2017 1st [facility] office visit/ Monica Mora   Chief Complaint  Patient presents with  . Pulmonary Consult    Referred by Dr. Cathlean Cower.  Pt c/o SOB for the past year. She gets winded walking "a distance" and walking up stairs. She uses her albuterol inhaler 1-2 x per day on average. She has occ PND and non prod cough.     1st noted spring 2017 trip to [place] more doe and then steps at home more difficult x  6 months and variable has to stop at top  Also Has to walk slower when shopping  Notes sometime discomfort episgastric with exertion only not related to eating x one year Sleep 2 pillows  Started needing albuterol one month prior to OV  Cough started  One year prior to [place]   "it's due to drainage" but note it is dry and only daytime Treated for "pna" in Dec 2018 but never had fever/ purulent sputum or any acute illness to correlate with "pna on cxr" by radiology and no better p rx as cap Hoarseness, sore throat= scratchy/pnds, dysphagia, dental problems, itching, sneezing,  nasal congestion o  rec  Pantoprazole (protonix) 40 mg   Take  30-60 min before first meal of the day and Pepcid (famotidine)  20 mg one @  bedtime until return to office - this is the best way to tell whether stomach acid is contributing to your problem.   GERD  stop lisinopril  And start  telmisartan 80  mg one daily   Please schedule a follow up office visit in 4 weeks, sooner if needed  with all medications /inhalers/ solutions in hand so we can verify exactly what you are taking. This includes all medications from all doctors and over the counters - add 6 m walk on return    12/17/2017  f/u ov/Monica Mora re: chronic doe/ cough ? acei  Chief Complaint  Patient presents with  . Follow-up    6MW test done today. Breathing is unchanged. Her cough is gone. She uses albuterol inhaler once per wk on average.   Dyspnea:  Sometimes on steps / if goes slow does fine =  MMRC1 = can walk nl pace, flat grade, can't hurry or go uphills or steps s sob   Cough: denies  Sleep: 2 pillows  SABA use:  Rare rec No change in meds HRCT chest  12/31/16> c/w lung ca    01/01/2018  f/u ov/Monica Mora re:  Lung mass/ doe  Chief Complaint  Patient presents with  . Follow-up    Per pt, follow up after CT scan. Breathing has been about the same since last visit.   Dyspnea:  MMRC2 = can't walk a nl pace on a flat grade s sob but  does fine slow and flat    Cough: gone since stopped lisinopril  Sleep: 2 pillows  SABA use:  Not using now  rec PET c/w ca > neg fob 01/21/18   Repeat FOB 02/11/18 Dr Lamonte Sakai  >  Neg ebus/ neg TBBx     02/13/2018  f/u ov/Monica Mora re: doe/ pulmonary infiltrates  Chief Complaint  Patient presents with  . Follow-up    Breathing progressively worse. She is using her proair at least 4 x per day. She also c/o non prod cough.   Dyspnea:  50 ft  Cough: more since procedure but dry   Sleep: ok  SABA use: reports does help for up to 2 hours   rec Prednisone 3m  X 4 each am with bfast until feeling better then reduce to 3 daily for a week then 2 daily   Only use your albuterol as a rescue medication  Work on inhaler technique:   - add  Lasix 20 mg daily / repeat echo  02/18/18 >>> C/w PH  - ANA pos 1:640 homogenous pattern    03/05/2018  f/u ov/Monica Mora re:? Lupus pneumonitis on   prednisone 10 x 2  Chief Complaint  Patient presents with  . Follow-up    Her breathing is doing well. She had to park a long distance and walk up stairs for her visit today, and was able to do this with no problems. She is using her proair 3 x per wk on average.    Dyspnea:  MMRC1 = can walk nl pace, flat grade, can't hurry or go uphills or steps s sob  - all over home [employer] sev days prior to OV  s need  saba or getting sob  Cough: none Sleep: one pillow  SABA use:  3 x weekly  rec No change rx    04/16/2018  f/u ov/Monica Mora re:  Inflammatory as dz  with pos ana but neg dna on pred 20 mg daily  Chief Complaint  Patient presents with  . Follow-up    Breathing seems to be doing well. She notices some wheezing at night when she lies down. She states she does not wish to begin on nocturnal o2.   Dyspnea:  MMRC1 = can walk nl pace, flat grade, can't hurry or go uphills or steps s sob   Cough:  Gone but some noct wheeze  Assoc with sensation of  pnds   SABA use: alb once a day  02: does not have it  / flatly refuses to consider using it  [facility]  For drainage / throat tickle try take CHLORPHENIRAMINE  4 mg - take one every 4 hours as needed   04/29/2018 acute extended ov/Vahan Wadsworth re: hemoptysis on coumadin for afib/ on pred 10 mg daily  Chief Complaint  Patient presents with  . Acute Visit    Reports pink tinged/bloody sputum when coughing, increased wheezing, and hoarse x 4 days.   continued to be sob [place] then 04/[place]/18 started coughing / bloody mucus no purulence maybe 2 tbsp total No change in sob / no epistaxis or other bleeding rec Stop coumadin until no bleeding at all for 3 straight days then resume previous dose Please remember to go to the lab and x-ray department downstairs in the basement  for your tests -  we will call you with the  results when they are available. Please schedule a follow up office visit in 4 weeks, sooner if needed  - add ESR back up on 10 mg pred so rec increased to 20 mg daily and refer to Rheum > refused to go "feel too good, I don't have joint problems"      05/29/2018  f/u ov/Ryan Ogborn re: prednisone 20 mg per day  Chief Complaint  Patient presents with  . Follow-up    Pt states she feels great today and she denies any co's.   Dyspnea:  MMRC3 = can't walk 100 yards even at a slow pace at a flat grade s stopping due to sob   Cough: no SABA use: x 2 -3 daily seems to help 02: none  No further coughing up blood  rec  Please see patient coordinator before you leave today  to schedule CT chest asap and I will call you with results 02 2lpm walking no need at rest  - we will order you ONO RA to see if need it then too  Please schedule a follow up office visit in 6 weeks, call sooner if needed     07/14/2018  f/u ov/Monica Mora re: PF/ prednisone 20 mg daily / did not bring meds as req  Chief Complaint  Patient presents with  . Follow-up    Breathing is about the same. She is only using her o2 while she is at home. She is using her proair inhaler 3 x daily on average.   Dyspnea:  Can do slow [employer] walking if does after albuterol last used w/in a hour of ov s 02  Cough: minimal  Sleeping: propped up 2 pillows no 02  SABA use: 3 x daily  02: has concentrator not using / has port 02 rarely uses    No obvious day to day or daytime variability or assoc excess/ purulent sputum or mucus plugs or hemoptysis or cp or chest tightness, subjective wheeze or overt sinus or hb symptoms.   Sleeping as above  without nocturnal  or early am exacerbation  of respiratory  c/o's or need for noct saba. Also denies any obvious fluctuation of symptoms with weather or environmental changes or other aggravating or alleviating factors except as outlined above   No unusual exposure hx or h/o childhood pna/ asthma or knowledge of  premature birth.  Current Allergies, Complete Past Medical History, Past Surgical History, Family History, and Social History were reviewed in [employer] record.  ROS  The following are not active complaints unless bolded Hoarseness, sore throat, dysphagia, dental problems, itching, sneezing,  nasal congestion or discharge of excess mucus or purulent secretions, ear ache,   fever, chills, sweats, unintended wt loss or wt gain, classically pleuritic or exertional cp,  orthopnea pnd or arm/hand swelling  or leg swelling, presyncope, palpitations, abdominal pain, anorexia, nausea, vomiting, diarrhea  or change in bowel habits or change in bladder habits, change in stools or change in urine, dysuria, hematuria,  rash, arthralgias, visual complaints, headache, numbness, weakness or ataxia or problems with walking or coordination,  change in mood or  memory.        Current Meds - not able to verify accuracy of this list   Medication Sig  . albuterol (PROAIR HFA) 108 (90 Base) MCG/ACT inhaler Inhale 2 puffs into the lungs every 6 (six) hours as needed for wheezing or shortness of breath.  . brimonidine (ALPHAGAN) 0.2 % ophthalmic  solution Place 1 drop into the left eye 3 (three) times daily.  . chlorpheniramine (CHLOR-TRIMETON) 4 MG tablet Take 4 mg by mouth daily.  . Cholecalciferol (VITAMIN D) 1000 UNITS capsule Take 1,000 Units by mouth daily.    . dorzolamide-timolol (COSOPT) 22.3-6.8 MG/ML ophthalmic solution Place 1 drop into both eyes 2 (two) times daily.   . famotidine (PEPCID) 20 MG tablet TAKE 1 TABLET BY MOUTH EVERYDAY AT BEDTIME  . furosemide (LASIX) 20 MG tablet Take 1 tablet (20 mg total) by mouth daily.  Marland Kitchen levothyroxine (SYNTHROID, LEVOTHROID) 75 MCG tablet Take 1 tablet (75 mcg total) by mouth daily.  . pantoprazole (PROTONIX) 40 MG tablet TAKE 1 TABLET (40 MG TOTAL) BY MOUTH DAILY. TAKE 30-60 MIN BEFORE FIRST MEAL OF THE DAY  . pilocarpine (PILOCAR) 1 % ophthalmic  solution Place 1 drop into the left eye 3 (three) times daily.  . predniSONE (DELTASONE) 10 MG tablet Take 2 tablets (20 mg total) by mouth daily. '20mg'$  daily until next OV, then '10mg'$  daily (Patient taking differently: Take 20 mg by mouth daily. )  . PROAIR HFA 108 (90 Base) MCG/ACT inhaler TAKE 2 PUFFS BY MOUTH EVERY 6 HOURS AS NEEDED FOR WHEEZE OR SHORTNESS OF BREATH  . telmisartan (MICARDIS) 80 MG tablet Take 1 tablet (80 mg total) by mouth daily.  . TOPROL XL 50 MG 24 hr tablet TAKE 1 TABLET BY MOUTH EVERY DAY WITH OR IMMEDIATELY FOLLOWING A MEAL  . Travoprost, BAK Free, (TRAVATAN) 0.004 % SOLN ophthalmic solution Place 1 drop into both eyes at bedtime.  . vitamin B-12 (CYANOCOBALAMIN) 500 MCG tablet Take 500 mcg by mouth daily.  Marland Kitchen warfarin (COUMADIN) 5 MG tablet Take as directed by coumadin clinic (Patient taking differently: Take 2.5-5 mg by mouth See admin instructions. Take 5 mg by mouth daily on 'Sunday, Tuesday, Thursday and Saturday. Take 2.5 mg by mouth daily on all other days.)                           Objective:   Physical Exam  Pleasant bf slt cushingnoid   07/14/2018     216  05/29/2018        214  04/29/2018      211 04/16/2018       212 03/05/2018       205  02/13/2018       214  01/01/2018         207  12/17/2017       20'$ 9   11/21/17 207 lb (93.9 kg)  11/20/17 207 lb 1.9 oz (93.9 kg)  11/19/17 210 lb (95.3 kg)   Vital signs reviewed - Note on arrival 02 sats  91% on RA        HEENT: nl turbinates bilaterally, and oropharynx. Nl external ear canals without cough reflex - top dentures/ lower partials    HEENT: nl   turbinates bilaterally, and oropharynx. Nl external ear canals without cough reflex- top dentures/ lower partials    NECK :  without JVD/Nodes/TM/ nl carotid upstrokes bilaterally   LUNGS: no acc muscle use,  Nl contour chest with minimal insp crackles R > L bilaterally without cough on insp or exp maneuvers   CV:  RRR  no s3 or murmur or  increase in P2, and no edema   ABD:  soft and nontender with nl inspiratory excursion in the supine position. No bruits or organomegaly appreciated, bowel sounds nl  MS:  Nl gait/ ext warm without deformities, calf tenderness, cyanosis or clubbing No obvious joint restrictions   SKIN: warm and dry without lesions    NEURO:  alert, approp, nl sensorium with  no motor or cerebellar deficits apparent.         I personally reviewed images and agree with radiology impression as follows:   Chest CT w/o contrast 06/16/18 1. No new consolidations. 2. Stable appearance of RIGHT LOWER lobe mass and multiple pulmonary nodules. Mediastinal/hilar lymph nodes are stable to slightly smaller. 3. Coronary artery calcification. 4. Focal pericardial calcifications.       Assessment:

## 2018-07-14 NOTE — Patient Instructions (Signed)
0xygenation is 2lpm via concentrator when are sleeping and when walking goal is to keep your 02 levels above 90%   Prednisone dose should be 3 pills instead of 4    Please schedule a follow up office visit in 2 weeks, sooner if needed  with all medications /inhalers/ solutions in hand so we can verify exactly what you are taking. This includes all medications from all doctors and over the counters

## 2018-07-14 NOTE — Telephone Encounter (Signed)
Noted. Med list updated.

## 2018-07-14 NOTE — Telephone Encounter (Signed)
Called patient and told her that we would not know if her particular drug has been recalled, that comes from the pharmacy, has to be a specific LOT# and manufacturer. She spoke to her pharmacy and they told her her drug had not been recalled,   The patient had an appointment with Pulmonary this morning and she told them she had not taken [place] this AM, they took it off her med list,  Can you please put [place] back on the patients med list. She still has some meds left and will continue to take them.

## 2018-07-14 NOTE — Telephone Encounter (Signed)
Copied from [place] [phone]. Topic: General - Other >> Jul 14, 2018 12:26 PM Lennox Solders wrote: Reason for CRM: pt is calling and she is taking hctz 12.5 mg. Pt said hctz is being recall. Please confirm. Pt talk with her pharm and they are not showing the med has been recall. Please call patient

## 2018-07-15 ENCOUNTER — Encounter: Payer: Self-pay | Admitting: Internal Medicine

## 2018-07-15 NOTE — Assessment & Plan Note (Addendum)
12/31/17 HRCT Chest 1. Masslike consolidation in the right lower lobe measuring 7.3 x 6.5 cm with associated volume loss and distortion of the right major fissure. There has been persistent consolidation in this location on multiple recent chest radiographs back to 05/22/2017. In addition, there was a morphologically suspicious irregular 2.7 cm pulmonary nodule in the anterior right lower lobe on the 11/08/2008 chest CT. While nonspecific, these findings are highly concerning for right lower lobe lung adenocarcinoma. 2. Numerous subsolid and solid pulmonary nodules in both lungs, worrisome for pulmonary metastases. 3. Patchy interlobular septal thickening in both lungs, right greater than left, worrisome for lymphangitic spread of tumor. 4. New nonspecific right paratracheal, subcarinal and bilateral hilar lymphadenopathy. Nodal metastases not excluded. - FOB  01/21/18  > atypical cells lining bronchi c/w  - Repeat FOB 02/11/18 Dr Lamonte Sakai  >  Neg ebus/ neg TBBx  - Quant TB 02/13/18 neg  - Collagen vasc screen 02/13/18 :  ESR 30/ ANA pos  1:640 homogeneous  - HSP 02/13/18 neg  - Rx prednisone 02/13/18 with borderline hypoxemia > all symptoms much better  03/05/2018 with improved aeration R base and anti dna neg > referred to rheumatology 04/29/18 - assoc hemoptysis on coumadin with INR 3.3 and esr up to 52 on pred 10 >  Resume pred 20 mg daily and refer to rheum/ hold coumadin until no blood x 3 days then resume with target INR 2.0 no higher  - CT chest 06/16/2018 1. No new consolidations. 2. Stable appearance of RIGHT LOWER lobe mass and multiple pulmonary nodules. Mediastinal/hilar lymph nodes are stable to slightly Smaller (not c/w ca as no rx rendered x prednisone so could still be lymphoma)    She clearly responds to prednisone clinically and also suspect there is response radiographically when looking at all of her studies serially.  The goal with a chronic steroid dependent illness is always  arriving at the lowest effective dose that controls the disease/symptoms and not accepting a set "formula" which is based on statistics or guidelines that don't always take into account patient  variability or the natural hx of the dz in every individual patient, which may well vary over time.  For now therefore I recommend the patient maintain  "3 pills a day " since doing generally better on 4 but must return with all meds in hand using a trust but verify approach to confirm accurate Medication  Reconciliation The principal here is that until we are certain that the  patients are doing what we've asked, it makes no sense to ask them to do more.    Also need rheum eval to review> requested.

## 2018-07-15 NOTE — Assessment & Plan Note (Signed)
05/29/2018 :Patient Saturations on Room Air at Rest = 93%>>  while Ambulating = 88%>>>  2 Liters of oxygen while Ambulating = 95% - ONO RA 06/08/18 desat < 89% x 6h 85m :   rec 2lpm hs and repeat ono on 2lpm 06/13/2018 >>>  Not done   Advised using 02 for her heart, not based on "how she feels" - have had no success at all re approp use of 02 in setting of probable cor pulmole but tried again today:  No need for 02 at rest, wear 2lpm at hs and with ambulation with goal of keeping > 90% at all times

## 2018-07-15 NOTE — Assessment & Plan Note (Signed)
-   Lexiscan neg 06/01/17  Neg  - PFT's  11/21/2017  FEV1 1.99 (108 % ) ratio 91  p no % improvement from saba p no prior to study with DLCO  29/28 % corrects to 45  % for alv volume   - 12/17/17 6 mw  312 m moderate pace, lowest sat was 90%  - Spirometry 01/01/2018  FEV1 1.76 (96%)  Ratio 89   Reports better p saba despite maint on prednisone and no evidence of any airflow obst on exam or by pfts but ok to continue for now to use prn / prefer she use/ monitor 02 sats with activity rather than rely on saba for this purpose.

## 2018-07-18 DIAGNOSIS — J9611 Chronic respiratory failure with hypoxia: Secondary | ICD-10-CM | POA: Diagnosis not present

## 2018-07-21 ENCOUNTER — Telehealth: Payer: Self-pay | Admitting: Internal Medicine

## 2018-07-21 DIAGNOSIS — J9611 Chronic respiratory failure with hypoxia: Secondary | ICD-10-CM | POA: Diagnosis not present

## 2018-07-21 NOTE — Telephone Encounter (Signed)
Called and spoke with patient and she states the construction workers found mold this morning in her bedroom. Potentially from water damage 3-4 years ago. Patient denied any worsening symptoms.  Routing to Dr. Melvyn Novas as a Juluis Rainier

## 2018-07-22 ENCOUNTER — Ambulatory Visit: Payer: Medicare Other | Admitting: *Deleted

## 2018-07-22 DIAGNOSIS — Z7901 Long term (current) use of anticoagulants: Secondary | ICD-10-CM

## 2018-07-22 DIAGNOSIS — I4891 Unspecified atrial fibrillation: Secondary | ICD-10-CM

## 2018-07-22 LAB — POCT INR: INR: 2.5 (ref 2.0–3.0)

## 2018-07-22 NOTE — Patient Instructions (Signed)
Description   Do not take your coumadin today then change your dose to 1/2 tablet daily  except 1 tablet on Tuesdays.  Recheck INR in 2 week. Continue eating 1 servings of leafy green vegetable each week. Prednisone reduced on 07/14/18 to 15mg  QD maintenance dosage and lower INR range no higher than 2.0 per Dr Melvyn Novas. Call us any medication changes or concerns to [phone]

## 2018-07-27 ENCOUNTER — Telehealth: Payer: Self-pay | Admitting: Internal Medicine

## 2018-07-27 ENCOUNTER — Other Ambulatory Visit: Payer: Self-pay | Admitting: Internal Medicine

## 2018-07-27 MED ORDER — PREDNISONE 5 MG PO TABS: 15.0000 mg | ORAL_TABLET | Freq: Every day | ORAL | 1 refills | Status: DC

## 2018-07-27 NOTE — Telephone Encounter (Signed)
Very unlikely it's causing any respiratory symptoms but we can check for mold allergy at next visit in meantime avoidance is only way to deal with it.

## 2018-07-27 NOTE — Telephone Encounter (Signed)
Spoke with pt. States that she called last week to let MW know that mold was found in her bedroom from possible water damage from 3-4 years ago per the construction workers who found it. Pt is requesting MW's recommendations on what she should do about this. Is there testing she should do?  MW - please advise. Thanks.

## 2018-07-27 NOTE — Telephone Encounter (Signed)
Spoke with pt. She is aware of Dr. Gustavus Bryant response. Nothing further was needed.

## 2018-07-31 ENCOUNTER — Ambulatory Visit: Payer: Medicare Other | Admitting: Internal Medicine

## 2018-07-31 ENCOUNTER — Encounter: Payer: Self-pay | Admitting: Internal Medicine

## 2018-07-31 ENCOUNTER — Other Ambulatory Visit (INDEPENDENT_AMBULATORY_CARE_PROVIDER_SITE_OTHER): Payer: Medicare Other

## 2018-07-31 VITALS — BP 122/82 | HR 79 | Ht 66.0 in | Wt 217.0 lb

## 2018-07-31 DIAGNOSIS — R918 Other nonspecific abnormal finding of lung field: Secondary | ICD-10-CM | POA: Diagnosis not present

## 2018-07-31 DIAGNOSIS — J9611 Chronic respiratory failure with hypoxia: Secondary | ICD-10-CM | POA: Diagnosis not present

## 2018-07-31 DIAGNOSIS — I2729 Other secondary pulmonary hypertension: Secondary | ICD-10-CM

## 2018-07-31 DIAGNOSIS — Z23 Encounter for immunization: Secondary | ICD-10-CM | POA: Diagnosis not present

## 2018-07-31 LAB — BASIC METABOLIC PANEL
BUN: 16 mg/dL (ref 6–23)
CALCIUM: 9.9 mg/dL (ref 8.4–10.5)
CO2: 30 mEq/L (ref 19–32)
Chloride: 96 mEq/L (ref 96–112)
Creatinine, Ser: 1.09 mg/dL (ref 0.40–1.20)
GFR: 61.96 mL/min (ref >60.00)
Glucose, Bld: 91 mg/dL (ref 70–99)
POTASSIUM: 3.8 meq/L (ref 3.5–5.1)
Sodium: 132 mEq/L — ABNORMAL LOW (ref 135–145)

## 2018-07-31 LAB — CBC WITH DIFFERENTIAL/PLATELET
BASOS ABS: 0.1 10*3/uL (ref 0.0–0.1)
Basophils Relative: 0.5 % (ref 0.0–3.0)
Eosinophils Absolute: 0.1 10*3/uL (ref 0.0–0.7)
Eosinophils Relative: 0.6 % (ref 0.0–5.0)
HCT: 45.9 % (ref 36.0–46.0)
Hemoglobin: 15.3 g/dL — ABNORMAL HIGH (ref 12.0–15.0)
LYMPHS PCT: 20.4 % (ref 12.0–46.0)
Lymphs Abs: 3.3 10*3/uL (ref 0.7–4.0)
MCHC: 33.4 g/dL (ref 30.0–36.0)
MCV: 102.2 fl — ABNORMAL HIGH (ref 78.0–100.0)
MONOS PCT: 7.5 % (ref 3.0–12.0)
Monocytes Absolute: 1.2 10*3/uL — ABNORMAL HIGH (ref 0.1–1.0)
NEUTROS ABS: 11.5 10*3/uL — AB (ref 1.4–7.7)
NEUTROS PCT: 71 % (ref 43.0–77.0)
Platelets: 247 10*3/uL (ref 150.0–400.0)
RBC: 4.49 Mil/uL (ref 3.87–5.11)
RDW: 14.3 % (ref 11.5–15.5)
WBC: 16.2 10*3/uL — ABNORMAL HIGH (ref 4.0–10.5)

## 2018-07-31 LAB — SEDIMENTATION RATE: Sed Rate: 27 mm/hr (ref 0–30)

## 2018-07-31 NOTE — Progress Notes (Signed)
Subjective:     Patient ID: Monica Mora, female   DOB: Sep 06, 1935,    MRN: [id number]    Brief patient profile:  41 yobf  quit smoking 1980 with new onset doe x spring 2017 with neg cards w/u 06/10/17 (neg lexiscan) so referred to pulmonary clinic 11/21/2017 by Dr   Jenny Reichmann with h/o PAF on coumadin and also s/p remote pericardial window 2002 in   [place]  for "constrictive" pericarditis s specific dx   and never saw rheumatologist as was just in [place] visiting and the window solved the problem but has had persistent non-specific  changes on CT chest in [place] since 11/06/2004 first detected here     History of Present Illness  11/21/2017 1st [facility] office visit/ Shulamis Wenberg   Chief Complaint  Patient presents with  . Pulmonary Consult    Referred by Dr. Cathlean Cower.  Pt c/o SOB for the past year. She gets winded walking "a distance" and walking up stairs. She uses her albuterol inhaler 1-2 x per day on average. She has occ PND and non prod cough.     1st noted spring 2017 trip to [place] more doe and then steps at home more difficult x  6 months and variable has to stop at top  Also Has to walk slower when shopping  Notes sometime discomfort episgastric with exertion only not related to eating x one year Sleep 2 pillows  Started needing albuterol one month prior to OV  Cough started  One year prior to [place]   "it's due to drainage" but note it is dry and only daytime Treated for "pna" in Dec 2018 but never had fever/ purulent sputum or any acute illness to correlate with "pna on cxr" by radiology and no better p rx as cap Hoarseness, sore throat= scratchy/pnds, dysphagia, dental problems, itching, sneezing,  nasal congestion o  rec  Pantoprazole (protonix) 40 mg   Take  30-60 min before first meal of the day and Pepcid (famotidine)  20 mg one @  bedtime until return to office - this is the best way to tell whether stomach acid is contributing to your problem.   GERD  stop lisinopril  And start  telmisartan 80  mg one daily   Please schedule a follow up office visit in 4 weeks, sooner if needed  with all medications /inhalers/ solutions in hand so we can verify exactly what you are taking. This includes all medications from all doctors and over the counters - add 6 m walk on return    12/17/2017  f/u ov/Zanna Hawn re: chronic doe/ cough ? acei  Chief Complaint  Patient presents with  . Follow-up    6MW test done today. Breathing is unchanged. Her cough is gone. She uses albuterol inhaler once per wk on average.   Dyspnea:  Sometimes on steps / if goes slow does fine =  MMRC1 = can walk nl pace, flat grade, can't hurry or go uphills or steps s sob   Cough: denies  Sleep: 2 pillows  SABA use:  Rare rec No change in meds HRCT chest  12/31/16> c/w lung ca    01/01/2018  f/u ov/Angelmarie Ponzo re:  Lung mass/ doe  Chief Complaint  Patient presents with  . Follow-up    Per pt, follow up after CT scan. Breathing has been about the same since last visit.   Dyspnea:  MMRC2 = can't walk a nl pace on a flat grade s sob but  does fine slow and flat    Cough: gone since stopped lisinopril  Sleep: 2 pillows  SABA use:  Not using now  rec PET c/w ca > neg fob 01/21/18   Repeat FOB 02/11/18 Dr Lamonte Sakai  >  Neg ebus/ neg TBBx     02/13/2018  f/u ov/Trinette Vera re: doe/ pulmonary infiltrates  Chief Complaint  Patient presents with  . Follow-up    Breathing progressively worse. She is using her proair at least 4 x per day. She also c/o non prod cough.   Dyspnea:  50 ft  Cough: more since procedure but dry   Sleep: ok  SABA use: reports does help for up to 2 hours   rec Prednisone 3m  X 4 each am with bfast until feeling better then reduce to 3 daily for a week then 2 daily   Only use your albuterol as a rescue medication  Work on inhaler technique:   - add  Lasix 20 mg daily / repeat echo  02/18/18 >>> C/w PH  - ANA pos 1:640 homogenous pattern    03/05/2018  f/u ov/Addalie Calles re:? Lupus pneumonitis on   prednisone 10 x 2  Chief Complaint  Patient presents with  . Follow-up    Her breathing is doing well. She had to park a long distance and walk up stairs for her visit today, and was able to do this with no problems. She is using her proair 3 x per wk on average.    Dyspnea:  MMRC1 = can walk nl pace, flat grade, can't hurry or go uphills or steps s sob  - all over home [employer] sev days prior to OV  s need  saba or getting sob  Cough: none Sleep: one pillow  SABA use:  3 x weekly  rec No change rx    04/16/2018  f/u ov/Thorin Starner re:  Inflammatory as dz  with pos ana but neg dna on pred 20 mg daily  Chief Complaint  Patient presents with  . Follow-up    Breathing seems to be doing well. She notices some wheezing at night when she lies down. She states she does not wish to begin on nocturnal o2.   Dyspnea:  MMRC1 = can walk nl pace, flat grade, can't hurry or go uphills or steps s sob   Cough:  Gone but some noct wheeze  Assoc with sensation of  pnds   SABA use: alb once a day  02: does not have it  / flatly refuses to consider using it  [facility]  For drainage / throat tickle try take CHLORPHENIRAMINE  4 mg - take one every 4 hours as needed   04/29/2018 acute extended ov/Almendra Loria re: hemoptysis on coumadin for afib/ on pred 10 mg daily  Chief Complaint  Patient presents with  . Acute Visit    Reports pink tinged/bloody sputum when coughing, increased wheezing, and hoarse x 4 days.   continued to be sob [place] 04/[place]/18 started coughing / bloody mucus no purulence maybe 2 tbsp total No change in sob / no epistaxis or other bleeding rec Stop coumadin until no bleeding at all for 3 straight days then resume previous dose Please remember to go to the lab and x-ray department downstairs in the basement  for your tests -  we will call you with the  results when they are available. Please schedule a follow up office visit in 4 weeks, sooner if needed  - add ESR back up on 10 mg pred so rec increased to 20 mg daily and refer to Rheum > refused to go "feel too good, I don't have joint problems"      05/29/2018  f/u ov/Rosanne Wohlfarth re: prednisone 20 mg per day  Chief Complaint  Patient presents with  . Follow-up    Pt states she feels great today and she denies any co's.   Dyspnea:  MMRC3 = can't walk 100 yards even at a slow pace at a flat grade s stopping due to sob   Cough: no SABA use: x 2 -3 daily seems to help 02: none  No further coughing up blood  rec  Please see patient coordinator before you leave today  to schedule CT chest asap and I will call you with results 02 2lpm walking no need at rest  - we will order you ONO RA to see if need it then too      07/14/2018  f/u ov/Josemiguel Gries re: PF/ prednisone '15mg'$  daily / did not bring meds as req  Chief Complaint  Patient presents with  . Follow-up    Breathing is about the same. She is only using her o2 while she is at home. She is using her proair inhaler 3 x daily on average.   Dyspnea:  Can do slow [employer] walking if does after albuterol last used w/in a hour of ov s 02  Cough: minimal  Sleeping: propped up 2 pillows no 02  SABA use: 3 x daily  02: has concentrator not using / has port 02 rarely uses  rec 0xygenation is 2lpm via concentrator when are sleeping and when walking goal is to keep your 02 levels above 90%  Prednisone dose should be 3 pills instead of 4   Please schedule a follow up office visit in 2 weeks, sooner if needed  with all medications /inhalers/ solutions in hand so we can verify exactly what you are taking. This includes all medications from all doctors and over the counters    07/31/2018  f/u ov/Briannia Laba re: pf has been on 3 x 5 mg = 15 mg daily  Chief Complaint  Patient presents with  . Follow-up    Breathing has improved since she has been avoiding mold that  the recently discovered in her home. She is using her albuterol inhaler 3-5 x per day.   Dyspnea:  Very sedentary even with 02 not doing much and still not using it consistently with activity  Cough: dry Sleeping: flat bed/ 3 pillows SABA use: as above mostly using saba p activities that make her sob like housework  02: 02 just sometimes with activity / none at hs     No obvious day to day or daytime variability or assoc excess/ purulent sputum or mucus plugs or hemoptysis or cp or chest tightness, subjective wheeze or overt sinus or hb symptoms.   Sleeping as above without nocturnal  or early am exacerbation  of respiratory  c/o's or need for noct saba and denies am ha or AMS/ wooziness mentally.  Also denies any obvious fluctuation of symptoms with weather or environmental changes or other aggravating or alleviating factors except as outlined above   No unusual exposure hx or h/o childhood pna/ asthma or knowledge of premature birth.  Current Allergies, Complete Past Medical History,  Past Surgical History, Family History, and Social History were reviewed in [employer] record.  ROS  The following are not active complaints unless bolded Hoarseness, sore throat, dysphagia, dental problems, itching, sneezing,  nasal congestion or discharge of excess mucus or purulent secretions, ear ache,   fever, chills, sweats, unintended wt loss or wt gain, classically pleuritic or exertional cp,  orthopnea pnd or arm/hand swelling  or leg swelling, presyncope, palpitations, abdominal pain, anorexia, nausea, vomiting, diarrhea  or change in bowel habits or change in bladder habits, change in stools or change in urine, dysuria, hematuria,  rash, arthralgias, visual complaints, headache, numbness, weakness or ataxia or problems with walking or coordination,  change in mood or  memory.        Current Meds  Medication Sig  . albuterol (PROAIR HFA) 108 (90 Base) MCG/ACT inhaler Inhale 2  puffs into the lungs every 6 (six) hours as needed for wheezing or shortness of breath.  . brimonidine (ALPHAGAN) 0.2 % ophthalmic solution Place 1 drop into the left eye 3 (three) times daily.  . chlorpheniramine (CHLOR-TRIMETON) 4 MG tablet Take 4 mg by mouth daily.  . Cholecalciferol (VITAMIN D) 1000 UNITS capsule Take 1,000 Units by mouth daily.    . dorzolamide-timolol (COSOPT) 22.3-6.8 MG/ML ophthalmic solution Place 1 drop into both eyes 2 (two) times daily.   . famotidine (PEPCID) 20 MG tablet TAKE 1 TABLET BY MOUTH EVERYDAY AT BEDTIME  . furosemide (LASIX) 20 MG tablet Take 1 tablet (20 mg total) by mouth daily.  . hydrochlorothiazide (MICROZIDE) 12.5 MG capsule Take 1 capsule (12.5 mg total) by mouth daily.  Marland Kitchen levothyroxine (SYNTHROID, LEVOTHROID) 75 MCG tablet Take 1 tablet (75 mcg total) by mouth daily.  . pantoprazole (PROTONIX) 40 MG tablet TAKE 1 TABLET (40 MG TOTAL) BY MOUTH DAILY. TAKE 30-60 MIN BEFORE FIRST MEAL OF THE DAY  . pilocarpine (PILOCAR) 1 % ophthalmic solution Place 1 drop into the left eye 3 (three) times daily.  . predniSONE (DELTASONE) 5 MG tablet Take 3 tablets (15 mg total) by mouth daily with breakfast.  . PROAIR HFA 108 (90 Base) MCG/ACT inhaler TAKE 2 PUFFS BY MOUTH EVERY 6 HOURS AS NEEDED FOR WHEEZE OR SHORTNESS OF BREATH  . telmisartan (MICARDIS) 80 MG tablet Take 1 tablet (80 mg total) by mouth daily.  . TOPROL XL 50 MG 24 hr tablet TAKE 1 TABLET BY MOUTH EVERY DAY WITH OR IMMEDIATELY FOLLOWING A MEAL  . Travoprost, BAK Free, (TRAVATAN) 0.004 % SOLN ophthalmic solution Place 1 drop into both eyes at bedtime.  . vitamin B-12 (CYANOCOBALAMIN) 500 MCG tablet Take 500 mcg by mouth daily.  Marland Kitchen warfarin (COUMADIN) 5 MG tablet TAKE AS DIRECTED BY COUMADIN CLINIC                  Objective:   Physical Exam  Pleasant slt cushingnoid bf nad   07/31/2018   217   07/14/2018     216  05/29/2018        214  04/29/2018      211 04/16/2018       212 03/05/2018        205  02/13/2018       214  01/01/2018         207  12/17/2017       209   11/21/17 207 lb (93.9 kg)  11/20/17 207 lb 1.9 oz (93.9 kg)  11/19/17 210 lb (95.3 kg)     Vital  signs reviewed - Note on arrival 02 sats  90% on 3lpm pulsed   But p resting up to 94% RA     HEENT: nl dentition, turbinates bilaterally, and oropharynx. Nl external ear canals without cough reflex   NECK :  without JVD/Nodes/TM/ nl carotid upstrokes bilaterally   LUNGS: no acc muscle use,  Nl contour chest with minimal insp crackles R > L  without cough on insp or exp maneuvers   CV:  RRR  no s3 or murmur or increase in P2, and no edema   ABD:  soft and nontender with nl inspiratory excursion in the supine position. No bruits or organomegaly appreciated, bowel sounds nl  MS:  Nl gait/ ext warm without deformities, calf tenderness, cyanosis or clubbing No obvious joint restrictions   SKIN: warm and dry without lesions    NEURO:  alert, approp, nl sensorium with  no motor or cerebellar deficits apparent.          Labs ordered/ reviewed:      Chemistry      Component Value Date/Time   NA 132 (L) 07/31/2018 1445   NA 135 03/28/2017 1448   K 3.8 07/31/2018 1445   CL 96 07/31/2018 1445   CO2 30 07/31/2018 1445   BUN 16 07/31/2018 1445   BUN 7 (L) 03/28/2017 1448   CREATININE 1.09 07/31/2018 1445   CREATININE 0.71 05/16/2016 1616      Component Value Date/Time   CALCIUM 9.9 07/31/2018 1445                             Lab Results  Component Value Date   WBC 16.2 (H) 07/31/2018   HGB 15.3 (H) 07/31/2018   HCT 45.9 07/31/2018   MCV 102.2 (H) 07/31/2018   PLT 247.0 07/31/2018       EOS                                                               0.1                                    07/31/2018       Lab Results  Component Value Date   ESRSEDRATE 27 07/31/2018   ESRSEDRATE 52 (H) 04/29/2018   ESRSEDRATE 18 03/05/2018             Assessment:

## 2018-07-31 NOTE — Patient Instructions (Addendum)
Goal is to keep your 02 levels above 90% at all times  Please remember to go to the lab department downstairs in the basement  for your tests - we will call you with the results when they are available and advise you on the right dose of prednisone going forward      Please schedule a follow up office visit in 6 weeks, call sooner if needed with cxr on return  - add :  Try taper pred to 10 mg per day by Aug 21 2018

## 2018-07-31 NOTE — Progress Notes (Signed)
LMTCB

## 2018-08-01 ENCOUNTER — Encounter: Payer: Self-pay | Admitting: Internal Medicine

## 2018-08-01 NOTE — Assessment & Plan Note (Signed)
05/29/2018 :Patient Saturations on Room Air at Rest = 93%>>  while Ambulating = 88%>>>  2 Liters of oxygen while Ambulating = 95% - ONO RA 06/08/18 desat < 89% x 6h 57m :   rec 2lpm hs and repeat ono on 2lpm 06/13/2018 >>>  Not done  - sats 94% at rest 07/31/2018   As of 07/31/2018 rec is 2lpm with sleep and adjust to keep > 90% with activity

## 2018-08-01 NOTE — Assessment & Plan Note (Addendum)
12/31/17 HRCT Chest 1. Masslike consolidation in the right lower lobe measuring 7.3 x 6.5 cm with associated volume loss and distortion of the right major fissure. There has been persistent consolidation in this location on multiple recent chest radiographs back to 05/22/2017. In addition, there was a morphologically suspicious irregular 2.7 cm pulmonary nodule in the anterior right lower lobe on the 11/08/2008 chest CT. While nonspecific, these findings are highly concerning for right lower lobe lung adenocarcinoma. 2. Numerous subsolid and solid pulmonary nodules in both lungs, worrisome for pulmonary metastases. 3. Patchy interlobular septal thickening in both lungs, right greater than left, worrisome for lymphangitic spread of tumor. 4. New nonspecific right paratracheal, subcarinal and bilateral hilar lymphadenopathy. Nodal metastases not excluded. 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 No new consolidations. 2. Stable appearance of RIGHT LOWER lobe mass and multiple pulmonary nodules. Mediastinal/hilar lymph nodes are stable to slightly Smaller (not c/w ca as no rx rendered x prednisone so could still be lymphoma)    Again discussed recs from rheumatology >  Discussed in detail all the  indications, usual  risks and alternatives  relative to the benefits with patient who declined  bx   In meantime since there does appear to be a steroid responsive component here:  The goal with a chronic steroid dependent illness is always arriving at the lowest effective dose that controls the disease/symptoms and not accepting a set "formula" which is based on statistics or guidelines that don't always take into account patient  variability or the natural hx of the dz in every individual patient, which may well vary over time.  With ESR now down to 27 >>>  I recommend the patient to taper gradually from 15 mb to 10 mg per day by Nov 1 but resume prev dose for any flares of cough or so. 

## 2018-08-01 NOTE — Assessment & Plan Note (Signed)
See echo 02/18/18  - Normal LV size with EF 55-60%. The RV was moderately dilated with   mildly decreased systolic function. D-shaped interventricular   septum was suggestive of RV pressure/volume overload. Severe   pulmonary hypertension. ONO RA 03/08/2018 >>> desats but pt declined 02  - 04/16/2018   Walked RA  2 laps @ 185 ft each stopped due to  desats to 85 nl pace/ no symptoms > declined amb 02  - 05/29/2018 agreed to amb 02 2lpm and check ono RA > see chronic resp failure   Again emphasized that the most important  rx here is 02 > see resp failure   I had an extended discussion with the patient reviewing all relevant studies completed to date and  lasting 15 to 20 minutes of a 25 minute visit    Each maintenance medication was reviewed in detail including most importantly the difference between maintenance and prns and under what circumstances the prns are to be triggered using an action plan format that is not reflected in the computer generated alphabetically organized AVS.     Please see AVS for specific instructions unique to this visit that I personally wrote and verbalized to the the pt in detail and then reviewed with pt  by my nurse highlighting any  changes in therapy recommended at today's visit to their plan of care.

## 2018-08-03 ENCOUNTER — Telehealth: Payer: Self-pay | Admitting: Internal Medicine

## 2018-08-03 NOTE — Progress Notes (Signed)
Was able to talk to patient regarding patient's results for their lab results.  They verbalized an understanding of what was discussed.  And read back instructions to me No further questions at this time.

## 2018-08-03 NOTE — Telephone Encounter (Signed)
Called spoke with patient. She understands how to take her prednisone. She had already taken it today so I told her to start tomorrow with 15mg  and then Wednesday 10mg , Thursday 15mg , Friday 10mg  an so on until November 1. She will take 15mg  and Nov 2 take 10mg  every day. She read these instructions back and verbalized understanding. She knows to call if she has any questions and to go back to the 15mg  if she starts to feel worse on the 10mg  daily.   Nothing further needed at this time.

## 2018-08-05 ENCOUNTER — Ambulatory Visit (INDEPENDENT_AMBULATORY_CARE_PROVIDER_SITE_OTHER): Payer: Medicare Other | Admitting: Endocrinology

## 2018-08-05 ENCOUNTER — Encounter: Payer: Self-pay | Admitting: Endocrinology

## 2018-08-05 VITALS — BP 148/78 | HR 82 | Ht 66.0 in | Wt 216.6 lb

## 2018-08-05 DIAGNOSIS — E890 Postprocedural hypothyroidism: Secondary | ICD-10-CM

## 2018-08-05 LAB — T4, FREE: Free T4: 0.99 ng/dL (ref 0.60–1.60)

## 2018-08-05 LAB — TSH: TSH: 1.02 u[IU]/mL (ref 0.35–4.50)

## 2018-08-05 NOTE — Patient Instructions (Addendum)
blood tests are requested for you today.  We'll let you know about the results.   Please come back for a follow-up appointment in 1 year.

## 2018-08-05 NOTE — Progress Notes (Signed)
Subjective:    Patient ID: Monica Mora, female    DOB: Sep 01, 1935, 83 y.o.   MRN: [id number]  HPI Pt returns for f/u of post-RAI hypothyroidism (she had RAI rx for hyperthyroidism, due to a goiter with just one small nodule, in 2008; she has been on synthroid since soon thereafter; she has been on the same dosage (75 mcg/day) for several years; she sees Dr Harrington Challenger for AF; last [place] in 2014 was unchanged).  pt states she feels well in general.  She takes synthroid as rx'ed.  Past Medical History:  Diagnosis Date  . Abnormal chest x-ray   . Atrial fibrillation ([place])   . CHF (congestive heart failure) ([place])   . Constrictive pericarditis    s/p pericardial window in 2002 in [place]  . Glaucoma   . H/O: hysterectomy 1992  . Heart murmur   . Hyperlipidemia   . Nontoxic multinodular goiter   . Other voice and resonance disorders   . TB (tuberculosis) 2002   positive skin test, negative chest x-ray  . Unspecified essential hypertension   . Unspecified hypothyroidism     Past Surgical History:  Procedure Laterality Date  . ABDOMINAL HYSTERECTOMY  1992  . PERICARDIAL WINDOW  2002  . Radioactive Iodine for hyperthyroidism/Multinodular goiter     s/p  . VIDEO BRONCHOSCOPY Bilateral 01/21/2018   Procedure: VIDEO BRONCHOSCOPY WITH FLUORO;  Surgeon: Tanda Rockers, MD;  Location: [facility];  Service: Cardiopulmonary;  Laterality: Bilateral;  . VIDEO BRONCHOSCOPY WITH ENDOBRONCHIAL NAVIGATION Right 02/11/2018   Procedure: VIDEO BRONCHOSCOPY WITH ENDOBRONCHIAL NAVIGATION;  Surgeon: Collene Gobble, MD;  Location: [facility];  Service: Thoracic;  Laterality: Right;  Marland Kitchen VIDEO BRONCHOSCOPY WITH ENDOBRONCHIAL ULTRASOUND Right 02/11/2018   Procedure: VIDEO BRONCHOSCOPY WITH ENDOBRONCHIAL ULTRASOUND;  Surgeon: Collene Gobble, MD;  Location: [facility];  Service: Thoracic;  Laterality: Right;    Social History   Socioeconomic History  . Marital status: Single    Spouse name: Not on file  . Number of  children: 0  . Years of education: Not on file  . Highest education level: Not on file  Occupational History  . Occupation: Retired    Comment: office work    Fish farm manager: RETIRED  Social Needs  . Financial resource strain: Not on file  . Food insecurity:    Worry: Not on file    Inability: Not on file  . Transportation needs:    Medical: Not on file    Non-medical: Not on file  Tobacco Use  . Smoking status: Former Smoker    Packs/day: 0.50    Years: 21.00    Pack years: 10.50    Types: Cigarettes    Last attempt to quit: 10/21/1978    Years since quitting: 39.8  . Smokeless tobacco: Never Used  Substance and Sexual Activity  . Alcohol use: No  . Drug use: No  . Sexual activity: Not Currently  Lifestyle  . Physical activity:    Days per week: Not on file    Minutes per session: Not on file  . Stress: Not on file  Relationships  . Social connections:    Talks on phone: Not on file    Gets together: Not on file    Attends religious service: Not on file    Active member of club or organization: Not on file    Attends meetings of clubs or organizations: Not on file    Relationship status: Not on file  . Intimate  partner violence:    Fear of current or ex partner: Not on file    Emotionally abused: Not on file    Physically abused: Not on file    Forced sexual activity: Not on file  Other Topics Concern  . Not on file  Social History Narrative   Daily Caffeine Use: 1 daily    Current Outpatient Medications on File Prior to Visit  Medication Sig Dispense Refill  . albuterol (PROAIR HFA) 108 (90 Base) MCG/ACT inhaler Inhale 2 puffs into the lungs every 6 (six) hours as needed for wheezing or shortness of breath. 1 Inhaler 3  . brimonidine (ALPHAGAN) 0.2 % ophthalmic solution Place 1 drop into the left eye 3 (three) times daily.    . chlorpheniramine (CHLOR-TRIMETON) 4 MG tablet Take 4 mg by mouth daily.    . Cholecalciferol (VITAMIN D) 1000 UNITS capsule Take 1,000 Units  by mouth daily.      . dorzolamide-timolol (COSOPT) 22.3-6.8 MG/ML ophthalmic solution Place 1 drop into both eyes 2 (two) times daily.     . famotidine (PEPCID) 20 MG tablet TAKE 1 TABLET BY MOUTH EVERYDAY AT BEDTIME 90 tablet 0  . furosemide (LASIX) 20 MG tablet Take 1 tablet (20 mg total) by mouth daily. 30 tablet 6  . hydrochlorothiazide (MICROZIDE) 12.5 MG capsule Take 1 capsule (12.5 mg total) by mouth daily. 90 capsule 0  . levothyroxine (SYNTHROID, LEVOTHROID) 75 MCG tablet Take 1 tablet (75 mcg total) by mouth daily. 90 tablet 2  . pantoprazole (PROTONIX) 40 MG tablet TAKE 1 TABLET (40 MG TOTAL) BY MOUTH DAILY. TAKE 30-60 MIN BEFORE FIRST MEAL OF THE DAY 90 tablet 1  . pilocarpine (PILOCAR) 1 % ophthalmic solution Place 1 drop into the left eye 3 (three) times daily.    . predniSONE (DELTASONE) 5 MG tablet Take 3 tablets (15 mg total) by mouth daily with breakfast. 90 tablet 1  . PROAIR HFA 108 (90 Base) MCG/ACT inhaler TAKE 2 PUFFS BY MOUTH EVERY 6 HOURS AS NEEDED FOR WHEEZE OR SHORTNESS OF BREATH 8.5 Inhaler 3  . telmisartan (MICARDIS) 80 MG tablet Take 1 tablet (80 mg total) by mouth daily. 30 tablet 11  . TOPROL XL 50 MG 24 hr tablet TAKE 1 TABLET BY MOUTH EVERY DAY WITH OR IMMEDIATELY FOLLOWING A MEAL 90 tablet 2  . Travoprost, BAK Free, (TRAVATAN) 0.004 % SOLN ophthalmic solution Place 1 drop into both eyes at bedtime.    . vitamin B-12 (CYANOCOBALAMIN) 500 MCG tablet Take 500 mcg by mouth daily.    Marland Kitchen warfarin (COUMADIN) 5 MG tablet TAKE AS DIRECTED BY COUMADIN CLINIC 80 tablet 1   No current facility-administered medications on file prior to visit.     No Known Allergies  Family History  Problem Relation Age of Onset  . Asthma Brother   . Arthritis Mother   . Arthritis Father   . Cervical cancer Sister   . Cancer Sister        Cervical Cancer    BP (!) 148/78 (BP Location: Right Arm, Patient Position: Sitting, Cuff Size: Normal)   Pulse 82   Ht 5\' 6"  (1.676 m)   Wt  216 lb 9.6 oz (98.2 kg)   SpO2 93% Comment: with O2 @ 3L  BMI 34.96 kg/m    Review of Systems Denies palpiations    Objective:   Physical Exam VITAL SIGNS:  See vs page GENERAL: no distress NECK: There is no palpable thyroid enlargement.  No thyroid nodule  is palpable.  No palpable lymphadenopathy at the anterior neck.  Lab Results  Component Value Date   TSH 1.02 08/05/2018      Assessment & Plan:  Hypothyroidism: well-replaced AF: in this setting, she needs to maintain euthyroidism  Patient Instructions  blood tests are requested for you today.  We'll let you know about the results.   Please come back for a follow-up appointment in 1 year.

## 2018-08-06 ENCOUNTER — Ambulatory Visit: Payer: Medicare Other | Admitting: *Deleted

## 2018-08-06 DIAGNOSIS — I4891 Unspecified atrial fibrillation: Secondary | ICD-10-CM | POA: Diagnosis not present

## 2018-08-06 DIAGNOSIS — Z7901 Long term (current) use of anticoagulants: Secondary | ICD-10-CM | POA: Diagnosis not present

## 2018-08-06 LAB — POCT INR: INR: 1.7 — AB (ref 2.0–3.0)

## 2018-08-06 NOTE — Patient Instructions (Signed)
Description   Today take 1 tablet then continue taking 1/2 tablet daily except 1 tablet on Tuesdays.  Recheck INR in 2 weeks. Continue eating 1 servings of leafy green vegetable each week. Prednisone is alternating 15mg  on odd days and 10mg  on even days and lower INR range no higher than 2.0 per Dr Melvyn Novas. Call us any medication changes or concerns to [phone]

## 2018-08-13 DIAGNOSIS — H401112 Primary open-angle glaucoma, right eye, moderate stage: Secondary | ICD-10-CM | POA: Diagnosis not present

## 2018-08-13 DIAGNOSIS — H401123 Primary open-angle glaucoma, left eye, severe stage: Secondary | ICD-10-CM | POA: Diagnosis not present

## 2018-08-17 DIAGNOSIS — J9611 Chronic respiratory failure with hypoxia: Secondary | ICD-10-CM | POA: Diagnosis not present

## 2018-08-18 ENCOUNTER — Telehealth: Payer: Self-pay | Admitting: Internal Medicine

## 2018-08-18 DIAGNOSIS — J309 Allergic rhinitis, unspecified: Secondary | ICD-10-CM

## 2018-08-18 NOTE — Telephone Encounter (Signed)
Needs allergy profile  Dx allergic rhinitis

## 2018-08-18 NOTE — Telephone Encounter (Signed)
Spoke with pt, advised her that MW would like to do a blood test to check for mold. Pt agreed. Order placed and she will come by tomorrow to get her blood drawn. Nothing further is needed.

## 2018-08-18 NOTE — Telephone Encounter (Signed)
Copied from [place] [id number]. Topic: Quick Communication - See Telephone Encounter >> Aug 18, 2018  1:20 PM Antonieta Iba C wrote: CRM for notification. See Telephone encounter for: 08/18/18.  Pt called in to be advised. Pt says that she came in contact with some mold. Pt would like to know if there is a way that she can be tested to make sure that it has not effected her system.   Please asssist [phone] - CB

## 2018-08-18 NOTE — Telephone Encounter (Signed)
Pt has been informed and will check with pulmonary.

## 2018-08-18 NOTE — Telephone Encounter (Signed)
Not that we could do here; she can check with her pulmonologist for other options if there are any.

## 2018-08-18 NOTE — Telephone Encounter (Signed)
Called and spoke with pt who stated she had been exposed to mold a few weeks ago and pt wants to know if a referral could be made to see if she still has any mold in her system.  Pt stated she discussed this with MW at last OV 07/31/18.  Dr. Melvyn Novas, please advise on this for pt. Thanks!

## 2018-08-19 ENCOUNTER — Other Ambulatory Visit: Payer: Medicare Other

## 2018-08-19 DIAGNOSIS — J309 Allergic rhinitis, unspecified: Secondary | ICD-10-CM

## 2018-08-20 ENCOUNTER — Ambulatory Visit: Payer: Medicare Other | Admitting: *Deleted

## 2018-08-20 DIAGNOSIS — Z7901 Long term (current) use of anticoagulants: Secondary | ICD-10-CM | POA: Diagnosis not present

## 2018-08-20 DIAGNOSIS — I4891 Unspecified atrial fibrillation: Secondary | ICD-10-CM | POA: Diagnosis not present

## 2018-08-20 LAB — RESPIRATORY ALLERGY PROFILE REGION II ~~LOC~~
Allergen, A. alternata, m6: <0.1 kU/L
Allergen, Cedar tree, t12: <0.1 kU/L
Allergen, Cottonwood, t14: <0.1 kU/L
Allergen, Mulberry, t76: <0.1 kU/L
Allergen, Oak,t7: <0.1 kU/L
Allergen, P. notatum, m1: <0.1 kU/L
Aspergillus fumigatus, m3: <0.1 kU/L
CLADOSPORIUM HERBARUM (M2) IGE: <0.1 kU/L
CLASS: 0
CLASS: 0
CLASS: 0
CLASS: 0
CLASS: 0
CLASS: 0
CLASS: 0
CLASS: 0
CLASS: 0
CLASS: 0
CLASS: 0
CLASS: 0
COMMON RAGWEED (SHORT) (W1) IGE: <0.1 kU/L
Cat Dander: <0.1 kU/L
Class: 0
Class: 0
Class: 0
Class: 0
Class: 0
Class: 0
Class: 0
Class: 0
Class: 0
Class: 0
Class: 0
Class: 0
D. farinae: <0.1 kU/L
Dog Dander: <0.1 kU/L
IgE (Immunoglobulin E), Serum: 15 kU/L (ref <=114)
Rough Pigweed  IgE: <0.1 kU/L
Timothy Grass: <0.1 kU/L

## 2018-08-20 LAB — POCT INR: INR: 1.6 — AB (ref 2.0–3.0)

## 2018-08-20 LAB — INTERPRETATION:

## 2018-08-20 NOTE — Patient Instructions (Signed)
Description   Change your dose to 1/2 tablet daily except 1 tablet on Tuesdays and Thursdays. Recheck in 2 weeks.  Continue eating 1 servings of leafy green vegetable each week. Prednisone is alternating 15mg  on odd days and 10mg  on even days, but tomorrow dosage is due to decrease to 5mg  daily. Lower INR, range no higher than 2.0 per Dr Melvyn Novas. Call us any medication changes or concerns to [phone]

## 2018-08-21 DIAGNOSIS — J9611 Chronic respiratory failure with hypoxia: Secondary | ICD-10-CM | POA: Diagnosis not present

## 2018-09-02 DIAGNOSIS — H401112 Primary open-angle glaucoma, right eye, moderate stage: Secondary | ICD-10-CM | POA: Diagnosis not present

## 2018-09-02 DIAGNOSIS — H2513 Age-related nuclear cataract, bilateral: Secondary | ICD-10-CM | POA: Diagnosis not present

## 2018-09-02 DIAGNOSIS — H02403 Unspecified ptosis of bilateral eyelids: Secondary | ICD-10-CM | POA: Diagnosis not present

## 2018-09-02 DIAGNOSIS — H401123 Primary open-angle glaucoma, left eye, severe stage: Secondary | ICD-10-CM | POA: Diagnosis not present

## 2018-09-03 ENCOUNTER — Ambulatory Visit: Payer: Medicare Other | Admitting: Pharmacist

## 2018-09-03 DIAGNOSIS — I4891 Unspecified atrial fibrillation: Secondary | ICD-10-CM

## 2018-09-03 DIAGNOSIS — Z7901 Long term (current) use of anticoagulants: Secondary | ICD-10-CM | POA: Diagnosis not present

## 2018-09-03 LAB — POCT INR: INR: 1.7 — AB (ref 2.0–3.0)

## 2018-09-03 NOTE — Patient Instructions (Signed)
Description   Change dose to 1/2 tablet daily except 1 tablet on Tuesdays, Thursdays, and Saturdays. Recheck in 2 weeks.  Continue eating 1 servings of leafy green vegetable each week. Prednisone is alternating 15mg  on odd days and 10mg  on even days, but tomorrow dosage is due to decrease to 5mg  daily. Lower INR, range no higher than 2.0 per Dr Melvyn Novas. Call us any medication changes or concerns to [phone]

## 2018-09-10 DIAGNOSIS — Z9981 Dependence on supplemental oxygen: Secondary | ICD-10-CM | POA: Diagnosis not present

## 2018-09-10 DIAGNOSIS — Z66 Do not resuscitate: Secondary | ICD-10-CM | POA: Diagnosis not present

## 2018-09-10 DIAGNOSIS — K5900 Constipation, unspecified: Secondary | ICD-10-CM | POA: Diagnosis not present

## 2018-09-10 DIAGNOSIS — Z87891 Personal history of nicotine dependence: Secondary | ICD-10-CM | POA: Diagnosis not present

## 2018-09-10 DIAGNOSIS — J9611 Chronic respiratory failure with hypoxia: Secondary | ICD-10-CM | POA: Diagnosis not present

## 2018-09-10 DIAGNOSIS — R768 Other specified abnormal immunological findings in serum: Secondary | ICD-10-CM | POA: Diagnosis not present

## 2018-09-10 DIAGNOSIS — Z8249 Family history of ischemic heart disease and other diseases of the circulatory system: Secondary | ICD-10-CM | POA: Diagnosis not present

## 2018-09-10 DIAGNOSIS — E785 Hyperlipidemia, unspecified: Secondary | ICD-10-CM | POA: Diagnosis not present

## 2018-09-10 DIAGNOSIS — J189 Pneumonia, unspecified organism: Secondary | ICD-10-CM | POA: Diagnosis not present

## 2018-09-10 DIAGNOSIS — I10 Essential (primary) hypertension: Secondary | ICD-10-CM | POA: Diagnosis not present

## 2018-09-10 DIAGNOSIS — I272 Pulmonary hypertension, unspecified: Secondary | ICD-10-CM | POA: Diagnosis not present

## 2018-09-10 DIAGNOSIS — I4811 Longstanding persistent atrial fibrillation: Secondary | ICD-10-CM | POA: Diagnosis not present

## 2018-09-10 DIAGNOSIS — Z79899 Other long term (current) drug therapy: Secondary | ICD-10-CM | POA: Diagnosis not present

## 2018-09-10 DIAGNOSIS — E890 Postprocedural hypothyroidism: Secondary | ICD-10-CM | POA: Diagnosis not present

## 2018-09-10 DIAGNOSIS — J9621 Acute and chronic respiratory failure with hypoxia: Secondary | ICD-10-CM | POA: Diagnosis not present

## 2018-09-10 DIAGNOSIS — I480 Paroxysmal atrial fibrillation: Secondary | ICD-10-CM | POA: Diagnosis not present

## 2018-09-10 DIAGNOSIS — R0602 Shortness of breath: Secondary | ICD-10-CM | POA: Diagnosis not present

## 2018-09-10 DIAGNOSIS — K219 Gastro-esophageal reflux disease without esophagitis: Secondary | ICD-10-CM | POA: Diagnosis not present

## 2018-09-10 DIAGNOSIS — Z7901 Long term (current) use of anticoagulants: Secondary | ICD-10-CM | POA: Diagnosis not present

## 2018-09-10 DIAGNOSIS — J440 Chronic obstructive pulmonary disease with acute lower respiratory infection: Secondary | ICD-10-CM | POA: Diagnosis not present

## 2018-09-11 DIAGNOSIS — R0602 Shortness of breath: Secondary | ICD-10-CM | POA: Diagnosis not present

## 2018-09-11 DIAGNOSIS — I361 Nonrheumatic tricuspid (valve) insufficiency: Secondary | ICD-10-CM | POA: Diagnosis not present

## 2018-09-11 DIAGNOSIS — R0609 Other forms of dyspnea: Secondary | ICD-10-CM | POA: Diagnosis not present

## 2018-09-11 DIAGNOSIS — I2729 Other secondary pulmonary hypertension: Secondary | ICD-10-CM | POA: Diagnosis not present

## 2018-09-12 DIAGNOSIS — R911 Solitary pulmonary nodule: Secondary | ICD-10-CM | POA: Diagnosis not present

## 2018-09-12 DIAGNOSIS — R0602 Shortness of breath: Secondary | ICD-10-CM | POA: Diagnosis not present

## 2018-09-12 DIAGNOSIS — I272 Pulmonary hypertension, unspecified: Secondary | ICD-10-CM | POA: Diagnosis not present

## 2018-09-12 DIAGNOSIS — I311 Chronic constrictive pericarditis: Secondary | ICD-10-CM | POA: Diagnosis not present

## 2018-09-13 DIAGNOSIS — R911 Solitary pulmonary nodule: Secondary | ICD-10-CM | POA: Diagnosis not present

## 2018-09-13 DIAGNOSIS — I311 Chronic constrictive pericarditis: Secondary | ICD-10-CM | POA: Diagnosis not present

## 2018-09-13 DIAGNOSIS — R0602 Shortness of breath: Secondary | ICD-10-CM | POA: Diagnosis not present

## 2018-09-14 ENCOUNTER — Telehealth: Payer: Self-pay

## 2018-09-14 ENCOUNTER — Telehealth: Payer: Self-pay | Admitting: *Deleted

## 2018-09-14 MED ORDER — ALBUTEROL SULFATE (2.5 MG/3ML) 0.083% IN NEBU
2.50 | INHALATION_SOLUTION | RESPIRATORY_TRACT | Status: DC
Start: 2018-09-13 — End: 2018-09-14

## 2018-09-14 MED ORDER — BISACODYL 5 MG PO TBEC
10.00 | DELAYED_RELEASE_TABLET | ORAL | Status: DC
Start: 2018-09-15 — End: 2018-09-14

## 2018-09-14 MED ORDER — CHOLECALCIFEROL 25 MCG (1000 UT) PO TABS
1000.00 | ORAL_TABLET | ORAL | Status: DC
Start: 2018-09-14 — End: 2018-09-14

## 2018-09-14 MED ORDER — METOPROLOL SUCCINATE ER 50 MG PO TB24
50.00 | ORAL_TABLET | ORAL | Status: DC
Start: 2018-09-14 — End: 2018-09-14

## 2018-09-14 MED ORDER — MAGNESIUM HYDROXIDE 400 MG/5ML PO SUSP
30.00 | ORAL | Status: DC
Start: ? — End: 2018-09-14

## 2018-09-14 MED ORDER — TELMISARTAN 40 MG PO TABS
80.00 | ORAL_TABLET | ORAL | Status: DC
Start: 2018-09-14 — End: 2018-09-14

## 2018-09-14 MED ORDER — BRIMONIDINE TARTRATE 0.2 % OP SOLN
1.00 | OPHTHALMIC | Status: DC
Start: 2018-09-14 — End: 2018-09-14

## 2018-09-14 MED ORDER — DOCUSATE SODIUM 100 MG PO CAPS
100.00 | ORAL_CAPSULE | ORAL | Status: DC
Start: 2018-09-13 — End: 2018-09-14

## 2018-09-14 MED ORDER — PANTOPRAZOLE SODIUM 40 MG PO TBEC
40.00 | DELAYED_RELEASE_TABLET | ORAL | Status: DC
Start: 2018-09-14 — End: 2018-09-14

## 2018-09-14 MED ORDER — LACTULOSE 10 GM/15ML PO SOLN
20.00 | ORAL | Status: DC
Start: ? — End: 2018-09-14

## 2018-09-14 MED ORDER — FUROSEMIDE 20 MG PO TABS
20.00 | ORAL_TABLET | ORAL | Status: DC
Start: 2018-09-14 — End: 2018-09-14

## 2018-09-14 MED ORDER — ENEMA 7-19 GM/118ML RE ENEM
1.00 | ENEMA | RECTAL | Status: DC
Start: ? — End: 2018-09-14

## 2018-09-14 MED ORDER — HEPARIN SOD (PORCINE) IN D5W 25000-5 UT/500ML-% IV SOLN
0.00 | INTRAVENOUS | Status: DC
Start: ? — End: 2018-09-14

## 2018-09-14 MED ORDER — PILOCARPINE HCL 1 % OP SOLN
1.00 | OPHTHALMIC | Status: DC
Start: 2018-09-14 — End: 2018-09-14

## 2018-09-14 MED ORDER — TRAVOPROST (BAK FREE) 0.004 % OP SOLN
1.00 | OPHTHALMIC | Status: DC
Start: 2018-09-13 — End: 2018-09-14

## 2018-09-14 MED ORDER — GENERIC EXTERNAL MEDICATION
30.00 | Status: DC
Start: ? — End: 2018-09-14

## 2018-09-14 MED ORDER — LEVOTHYROXINE SODIUM 75 MCG PO TABS
75.00 | ORAL_TABLET | ORAL | Status: DC
Start: 2018-09-14 — End: 2018-09-14

## 2018-09-14 MED ORDER — ACETAMINOPHEN 325 MG PO TABS
650.00 | ORAL_TABLET | ORAL | Status: DC
Start: ? — End: 2018-09-14

## 2018-09-14 MED ORDER — WARFARIN SODIUM 1 MG PO TABS
1.00 | ORAL_TABLET | ORAL | Status: DC
Start: ? — End: 2018-09-14

## 2018-09-14 MED ORDER — HEPARIN SODIUM (PORCINE) 1000 UNIT/ML IJ SOLN
4000.00 | INTRAMUSCULAR | Status: DC
Start: ? — End: 2018-09-14

## 2018-09-14 MED ORDER — DOXYCYCLINE MONOHYDRATE 100 MG PO CAPS
100.00 | ORAL_CAPSULE | ORAL | Status: DC
Start: 2018-09-13 — End: 2018-09-14

## 2018-09-14 MED ORDER — OXYCODONE HCL 5 MG PO TABS
5.00 | ORAL_TABLET | ORAL | Status: DC
Start: ? — End: 2018-09-14

## 2018-09-14 MED ORDER — DORZOLAMIDE HCL-TIMOLOL MAL 22.3-6.8 MG/ML OP SOLN
1.00 | OPHTHALMIC | Status: DC
Start: 2018-09-13 — End: 2018-09-14

## 2018-09-14 MED ORDER — FAMOTIDINE 20 MG PO TABS
20.00 | ORAL_TABLET | ORAL | Status: DC
Start: 2018-09-13 — End: 2018-09-14

## 2018-09-14 MED ORDER — ONDANSETRON HCL 4 MG/2ML IJ SOLN
4.00 | INTRAMUSCULAR | Status: DC
Start: ? — End: 2018-09-14

## 2018-09-14 NOTE — Telephone Encounter (Signed)
Pt requested to cancel coumadin visit on 11/26 due to being out of state and wanted an office where she was to check inr. Advised pt to fax or call the office with results. Pt stated that they would make a new appt with the [facility] on [address] when they are back in [place].

## 2018-09-14 NOTE — Telephone Encounter (Signed)
Monica Mora spoke with pt after I placed pt on hold and assisted with the needs of the pt.

## 2018-09-14 NOTE — Telephone Encounter (Signed)
Patient called and stated she needed to have an INR drawn while she is out of town in [place], [place]. Pt stated she has been in the hospital at [facility] and her [facility] wants her to have the INR drawn. She stated they are not following her INR but they wanted her to follow up with it. She stated she will be in [place] for another week or two weeks. Advised pt that I will need to know where to send the lab order and that I need the location and fax number if possible so it can be faxed over. Pt states she will speak with her niece and call us back to give [place] the number.

## 2018-09-15 ENCOUNTER — Ambulatory Visit: Payer: Medicare Other | Admitting: Internal Medicine

## 2018-09-15 NOTE — Telephone Encounter (Signed)
Pt returned call and will go to [facility] tomorrow to have INR checked. She needs order faxed to [phone].   Order faxed   Advised patient to call if we do not contact her shortly after having drawn as want to make sure receive result before the holiday since we are not here this Thursday or Friday due to the holiday. She states understanding.

## 2018-09-16 ENCOUNTER — Other Ambulatory Visit: Payer: Self-pay | Admitting: Internal Medicine

## 2018-09-16 DIAGNOSIS — Z5181 Encounter for therapeutic drug level monitoring: Secondary | ICD-10-CM | POA: Diagnosis not present

## 2018-09-16 DIAGNOSIS — I272 Pulmonary hypertension, unspecified: Secondary | ICD-10-CM | POA: Diagnosis not present

## 2018-09-16 DIAGNOSIS — R0602 Shortness of breath: Secondary | ICD-10-CM | POA: Diagnosis not present

## 2018-09-16 DIAGNOSIS — R768 Other specified abnormal immunological findings in serum: Secondary | ICD-10-CM | POA: Diagnosis not present

## 2018-09-16 DIAGNOSIS — R918 Other nonspecific abnormal finding of lung field: Secondary | ICD-10-CM | POA: Diagnosis not present

## 2018-09-16 NOTE — Telephone Encounter (Signed)
Spoke with patient who states that she had lab drawn about an hour ago. Pt's niece states that number to [facility] is [phone].   Called [facility] to obtain result. They do not have result as of yet since drawn about an hour ago. Usual turn around is 4 hours.  It is also uncovered that patient was prescribed Doxy 100mg  BIDx5 days and Prednisone taper.   Called pt back and advised to take 1/2 tablet daily through the weekend (usual dose is 1 tablet on T, Th, S, 1/2 tablet all other days). Advised that we will need to check INR on Monday. She states that she is in [place] and "has not been released from the pulmonologist's care." She is unable to come to clinic. Will send another order for her to have INR checked on Monday. She is aware that she will need to go early so that we can receive result before 5pm.   Order faxed.

## 2018-09-17 DIAGNOSIS — J9611 Chronic respiratory failure with hypoxia: Secondary | ICD-10-CM | POA: Diagnosis not present

## 2018-09-17 LAB — PROTIME-INR
INR: 1.3 — AB (ref 0.8–1.2)
Prothrombin Time: 13 s — ABNORMAL HIGH (ref 9.1–12.0)

## 2018-09-18 DIAGNOSIS — R768 Other specified abnormal immunological findings in serum: Secondary | ICD-10-CM | POA: Diagnosis not present

## 2018-09-18 DIAGNOSIS — R0602 Shortness of breath: Secondary | ICD-10-CM | POA: Diagnosis not present

## 2018-09-20 DIAGNOSIS — J9611 Chronic respiratory failure with hypoxia: Secondary | ICD-10-CM | POA: Diagnosis not present

## 2018-09-22 ENCOUNTER — Telehealth: Payer: Self-pay | Admitting: *Deleted

## 2018-09-22 ENCOUNTER — Encounter: Payer: Self-pay | Admitting: *Deleted

## 2018-09-22 NOTE — Telephone Encounter (Signed)
Called the pt since she was supposed to have an INR completed on Monday,09/21/18, pt did not have the INR done per her reporting. Pt States she finished the doxycycline and she kept her normal Prednisone dose at 5mg  daily and did not start the tapered dose but will start on Friday 09/25/18, also, pt stated she kept her Coumadin/Warfarin dose the same and did not reduce her dose as instructed by Eino Farber.  Advised pt she needs to be seen in the Clinic to see where her INR is so we can manage her Coumadin/Warfarin dose. Also, states she is may be switching her doctors to [facility].

## 2018-09-22 NOTE — Telephone Encounter (Signed)
This encounter was created in error - please disregard.

## 2018-09-23 NOTE — Telephone Encounter (Signed)
Called pt and confirmed her appt for tomorrow, 09/24/18 at 2pm as she will have someone to bring her to the pt.

## 2018-09-24 ENCOUNTER — Ambulatory Visit: Payer: Medicare Other | Admitting: *Deleted

## 2018-09-24 DIAGNOSIS — Z7901 Long term (current) use of anticoagulants: Secondary | ICD-10-CM | POA: Diagnosis not present

## 2018-09-24 DIAGNOSIS — I4891 Unspecified atrial fibrillation: Secondary | ICD-10-CM | POA: Diagnosis not present

## 2018-09-24 LAB — POCT INR: INR: 2.1 (ref 2.0–3.0)

## 2018-09-24 NOTE — Patient Instructions (Signed)
Description   Tomorrow, Saturday and Sunday take 1/2 tablet, Recheck on Monday.  Continue eating 1 servings of leafy green vegetable each week. Prednisone tapering dose started today.  Lower INR, range no higher than 2.0 per Dr Melvyn Novas. Call us any medication changes or concerns to [phone]

## 2018-09-30 ENCOUNTER — Other Ambulatory Visit: Payer: Self-pay | Admitting: Internal Medicine

## 2018-10-01 ENCOUNTER — Ambulatory Visit: Payer: Medicare Other | Admitting: *Deleted

## 2018-10-01 DIAGNOSIS — Z7901 Long term (current) use of anticoagulants: Secondary | ICD-10-CM | POA: Diagnosis not present

## 2018-10-01 DIAGNOSIS — I4891 Unspecified atrial fibrillation: Secondary | ICD-10-CM

## 2018-10-01 LAB — POCT INR: INR: 2.5 (ref 2.0–3.0)

## 2018-10-01 NOTE — Patient Instructions (Signed)
Description   Take 1/2 tablet until next visit on Monday.  Continue eating 1 servings of leafy green vegetable each week. Prednisone tapering dose.  Lower INR, range no higher than 2.0 per Dr Melvyn Novas. Call us any medication changes or concerns to [phone]

## 2018-10-05 ENCOUNTER — Ambulatory Visit: Payer: Medicare Other | Admitting: Pharmacist

## 2018-10-05 DIAGNOSIS — H401112 Primary open-angle glaucoma, right eye, moderate stage: Secondary | ICD-10-CM | POA: Diagnosis not present

## 2018-10-05 DIAGNOSIS — Z7901 Long term (current) use of anticoagulants: Secondary | ICD-10-CM

## 2018-10-05 DIAGNOSIS — I4891 Unspecified atrial fibrillation: Secondary | ICD-10-CM

## 2018-10-05 DIAGNOSIS — H401123 Primary open-angle glaucoma, left eye, severe stage: Secondary | ICD-10-CM | POA: Diagnosis not present

## 2018-10-05 LAB — POCT INR: INR: 1.5 — AB (ref 2.0–3.0)

## 2018-10-05 NOTE — Patient Instructions (Signed)
Description   Take 1 tablet today, then resume normal dose of 1/2 tablet daily except 1 tablet on Tuesdays, Thursdays, and Saturdays. Lower INR range, no higher than 2.0 per Dr Melvyn Novas. Call us with any medication changes or concerns to [phone]

## 2018-10-08 ENCOUNTER — Telehealth: Payer: Self-pay | Admitting: Internal Medicine

## 2018-10-08 MED ORDER — ALBUTEROL SULFATE HFA 108 (90 BASE) MCG/ACT IN AERS: 2.0000 | INHALATION_SPRAY | Freq: Four times a day (QID) | RESPIRATORY_TRACT | 3 refills | Status: AC | PRN

## 2018-10-08 NOTE — Telephone Encounter (Signed)
Called and spoke with pt letting her know that I would refill her proair inhaler for her. I verified pt's preferred pharmacy and sent Rx in. Stated to pt when she wanted to come by office to drop off form for handicap placard, we would give it to Dr. Melvyn Novas for him to fill out and sign for her and then we would call and let her know when it was completed if she wanted to drop it off.  Pt expressed understanding. Nothing further needed.

## 2018-10-08 NOTE — Telephone Encounter (Signed)
Pharm is [facility] on [address].

## 2018-10-16 ENCOUNTER — Ambulatory Visit: Payer: Medicare Other | Admitting: Pharmacist

## 2018-10-16 DIAGNOSIS — Z7901 Long term (current) use of anticoagulants: Secondary | ICD-10-CM

## 2018-10-16 DIAGNOSIS — I4891 Unspecified atrial fibrillation: Secondary | ICD-10-CM

## 2018-10-16 LAB — POCT INR: INR: 2.9 (ref 2.0–3.0)

## 2018-10-16 NOTE — Patient Instructions (Signed)
Hold Coumadin today, take 1/2 tablet tomorrow, then resume normal dose of 1/2 tablet daily except 1 tablet on Tuesdays, Thursdays, and Saturdays. Lower INR range, no higher than 2.0 per Dr Melvyn Novas. Continue to eat 1 serving of greens per week. Call us with any medication changes or concerns to [phone]

## 2018-10-17 DIAGNOSIS — J9611 Chronic respiratory failure with hypoxia: Secondary | ICD-10-CM | POA: Diagnosis not present

## 2018-10-21 DIAGNOSIS — J9611 Chronic respiratory failure with hypoxia: Secondary | ICD-10-CM | POA: Diagnosis not present

## 2018-10-28 DIAGNOSIS — J849 Interstitial pulmonary disease, unspecified: Secondary | ICD-10-CM | POA: Diagnosis not present

## 2018-10-28 DIAGNOSIS — I10 Essential (primary) hypertension: Secondary | ICD-10-CM | POA: Diagnosis not present

## 2018-11-12 ENCOUNTER — Ambulatory Visit: Payer: Medicare Other

## 2018-11-12 DIAGNOSIS — I4891 Unspecified atrial fibrillation: Secondary | ICD-10-CM

## 2018-11-12 DIAGNOSIS — Z7901 Long term (current) use of anticoagulants: Secondary | ICD-10-CM

## 2018-11-12 LAB — POCT INR: INR: 2.5 (ref 2.0–3.0)

## 2018-11-12 NOTE — Patient Instructions (Signed)
Description    Continue on same dosage 1/2 tablet daily except 1 tablet on Tuesdays, Thursdays and Saturdays.  Continue to eat 1 serving of greens per week. Recheck in 4 weeks.  Call us with any medication changes or concerns to [phone]

## 2018-11-13 ENCOUNTER — Telehealth: Payer: Self-pay | Admitting: Internal Medicine

## 2018-11-13 DIAGNOSIS — J9611 Chronic respiratory failure with hypoxia: Secondary | ICD-10-CM

## 2018-11-13 NOTE — Telephone Encounter (Signed)
LMTCB for James 

## 2018-11-16 NOTE — Telephone Encounter (Signed)
Attempted to contact [facility]. They do not open until 8:30am.

## 2018-11-16 NOTE — Telephone Encounter (Signed)
Jeneen Rinks with [facility] states patient is telling them that she is not getting enough of air off her POC-they would like to evaluation of POC to titrate and see which O2 she truly needs and liter flow. Order placed and they will contact patient.

## 2018-11-17 DIAGNOSIS — J9611 Chronic respiratory failure with hypoxia: Secondary | ICD-10-CM | POA: Diagnosis not present

## 2018-11-21 DIAGNOSIS — J9611 Chronic respiratory failure with hypoxia: Secondary | ICD-10-CM | POA: Diagnosis not present

## 2018-12-03 ENCOUNTER — Other Ambulatory Visit: Payer: Self-pay | Admitting: Internal Medicine

## 2018-12-04 ENCOUNTER — Other Ambulatory Visit: Payer: Self-pay | Admitting: Internal Medicine

## 2018-12-06 ENCOUNTER — Other Ambulatory Visit: Payer: Self-pay | Admitting: Internal Medicine

## 2018-12-09 ENCOUNTER — Other Ambulatory Visit: Payer: Self-pay | Admitting: Internal Medicine

## 2018-12-10 ENCOUNTER — Ambulatory Visit: Payer: Medicare Other | Admitting: Internal Medicine

## 2018-12-10 ENCOUNTER — Other Ambulatory Visit: Payer: Self-pay | Admitting: Endocrinology

## 2018-12-16 ENCOUNTER — Ambulatory Visit: Payer: Medicare Other | Admitting: Internal Medicine

## 2018-12-17 DIAGNOSIS — I361 Nonrheumatic tricuspid (valve) insufficiency: Secondary | ICD-10-CM | POA: Diagnosis not present

## 2018-12-17 DIAGNOSIS — I5032 Chronic diastolic (congestive) heart failure: Secondary | ICD-10-CM | POA: Diagnosis not present

## 2018-12-17 DIAGNOSIS — J9611 Chronic respiratory failure with hypoxia: Secondary | ICD-10-CM | POA: Diagnosis not present

## 2018-12-17 DIAGNOSIS — I480 Paroxysmal atrial fibrillation: Secondary | ICD-10-CM | POA: Diagnosis not present

## 2018-12-17 DIAGNOSIS — I10 Essential (primary) hypertension: Secondary | ICD-10-CM | POA: Diagnosis not present

## 2018-12-17 DIAGNOSIS — J9621 Acute and chronic respiratory failure with hypoxia: Secondary | ICD-10-CM | POA: Diagnosis not present

## 2018-12-17 DIAGNOSIS — K219 Gastro-esophageal reflux disease without esophagitis: Secondary | ICD-10-CM | POA: Diagnosis not present

## 2018-12-17 DIAGNOSIS — J9851 Mediastinitis: Secondary | ICD-10-CM | POA: Diagnosis not present

## 2018-12-17 DIAGNOSIS — I351 Nonrheumatic aortic (valve) insufficiency: Secondary | ICD-10-CM | POA: Diagnosis not present

## 2018-12-17 DIAGNOSIS — I272 Pulmonary hypertension, unspecified: Secondary | ICD-10-CM | POA: Diagnosis not present

## 2018-12-17 DIAGNOSIS — Z7952 Long term (current) use of systemic steroids: Secondary | ICD-10-CM | POA: Diagnosis not present

## 2018-12-17 DIAGNOSIS — J9859 Other diseases of mediastinum, not elsewhere classified: Secondary | ICD-10-CM | POA: Diagnosis not present

## 2018-12-17 DIAGNOSIS — E860 Dehydration: Secondary | ICD-10-CM | POA: Diagnosis not present

## 2018-12-17 DIAGNOSIS — E785 Hyperlipidemia, unspecified: Secondary | ICD-10-CM | POA: Diagnosis not present

## 2018-12-17 DIAGNOSIS — I340 Nonrheumatic mitral (valve) insufficiency: Secondary | ICD-10-CM | POA: Diagnosis not present

## 2018-12-17 DIAGNOSIS — R0600 Dyspnea, unspecified: Secondary | ICD-10-CM | POA: Diagnosis not present

## 2018-12-17 DIAGNOSIS — E871 Hypo-osmolality and hyponatremia: Secondary | ICD-10-CM | POA: Diagnosis not present

## 2018-12-17 DIAGNOSIS — T45515A Adverse effect of anticoagulants, initial encounter: Secondary | ICD-10-CM | POA: Diagnosis not present

## 2018-12-17 DIAGNOSIS — T502X5A Adverse effect of carbonic-anhydrase inhibitors, benzothiadiazides and other diuretics, initial encounter: Secondary | ICD-10-CM | POA: Diagnosis not present

## 2018-12-17 DIAGNOSIS — R0602 Shortness of breath: Secondary | ICD-10-CM | POA: Diagnosis not present

## 2018-12-17 DIAGNOSIS — R9389 Abnormal findings on diagnostic imaging of other specified body structures: Secondary | ICD-10-CM | POA: Diagnosis not present

## 2018-12-17 DIAGNOSIS — I358 Other nonrheumatic aortic valve disorders: Secondary | ICD-10-CM | POA: Diagnosis not present

## 2018-12-17 DIAGNOSIS — I5033 Acute on chronic diastolic (congestive) heart failure: Secondary | ICD-10-CM | POA: Diagnosis not present

## 2018-12-17 DIAGNOSIS — H409 Unspecified glaucoma: Secondary | ICD-10-CM | POA: Diagnosis not present

## 2018-12-17 DIAGNOSIS — I959 Hypotension, unspecified: Secondary | ICD-10-CM | POA: Diagnosis not present

## 2018-12-17 DIAGNOSIS — E876 Hypokalemia: Secondary | ICD-10-CM | POA: Diagnosis not present

## 2018-12-17 DIAGNOSIS — R110 Nausea: Secondary | ICD-10-CM | POA: Diagnosis not present

## 2018-12-17 DIAGNOSIS — T501X6A Underdosing of loop [high-ceiling] diuretics, initial encounter: Secondary | ICD-10-CM | POA: Diagnosis not present

## 2018-12-17 DIAGNOSIS — R9431 Abnormal electrocardiogram [ECG] [EKG]: Secondary | ICD-10-CM | POA: Diagnosis not present

## 2018-12-17 DIAGNOSIS — J449 Chronic obstructive pulmonary disease, unspecified: Secondary | ICD-10-CM | POA: Diagnosis not present

## 2018-12-17 DIAGNOSIS — Z7901 Long term (current) use of anticoagulants: Secondary | ICD-10-CM | POA: Diagnosis not present

## 2018-12-17 DIAGNOSIS — Z9981 Dependence on supplemental oxygen: Secondary | ICD-10-CM | POA: Diagnosis not present

## 2018-12-17 DIAGNOSIS — E890 Postprocedural hypothyroidism: Secondary | ICD-10-CM | POA: Diagnosis not present

## 2018-12-17 DIAGNOSIS — I517 Cardiomegaly: Secondary | ICD-10-CM | POA: Diagnosis not present

## 2018-12-17 DIAGNOSIS — R109 Unspecified abdominal pain: Secondary | ICD-10-CM | POA: Diagnosis not present

## 2018-12-17 DIAGNOSIS — I2729 Other secondary pulmonary hypertension: Secondary | ICD-10-CM | POA: Diagnosis not present

## 2018-12-17 DIAGNOSIS — R791 Abnormal coagulation profile: Secondary | ICD-10-CM | POA: Diagnosis not present

## 2018-12-17 DIAGNOSIS — H547 Unspecified visual loss: Secondary | ICD-10-CM | POA: Diagnosis not present

## 2018-12-17 DIAGNOSIS — J811 Chronic pulmonary edema: Secondary | ICD-10-CM | POA: Diagnosis not present

## 2018-12-17 DIAGNOSIS — I110 Hypertensive heart disease with heart failure: Secondary | ICD-10-CM | POA: Diagnosis not present

## 2018-12-17 MED ORDER — POTASSIUM CHLORIDE CRYS ER 20 MEQ PO TBCR
20.00 | EXTENDED_RELEASE_TABLET | ORAL | Status: DC
Start: 2018-12-17 — End: 2018-12-17

## 2018-12-17 MED ORDER — FUROSEMIDE 10 MG/ML IJ SOLN
40.00 | INTRAMUSCULAR | Status: DC
Start: 2018-12-18 — End: 2018-12-17

## 2018-12-17 MED ORDER — PREDNISONE 20 MG PO TABS
40.00 | ORAL_TABLET | ORAL | Status: DC
Start: 2018-12-18 — End: 2018-12-17

## 2018-12-24 MED ORDER — DORZOLAMIDE HCL-TIMOLOL MAL 22.3-6.8 MG/ML OP SOLN
1.00 | OPHTHALMIC | Status: DC
Start: 2018-12-25 — End: 2018-12-24

## 2018-12-24 MED ORDER — WARFARIN SODIUM 5 MG PO TABS
5.00 | ORAL_TABLET | ORAL | Status: DC
Start: 2018-12-29 — End: 2018-12-24

## 2018-12-24 MED ORDER — ACETAMINOPHEN 325 MG PO TABS
650.00 | ORAL_TABLET | ORAL | Status: DC
Start: ? — End: 2018-12-24

## 2018-12-24 MED ORDER — IPRATROPIUM-ALBUTEROL 0.5-2.5 (3) MG/3ML IN SOLN
3.00 | RESPIRATORY_TRACT | Status: DC
Start: ? — End: 2018-12-24

## 2018-12-24 MED ORDER — HYDRALAZINE HCL 20 MG/ML IJ SOLN
10.00 | INTRAMUSCULAR | Status: DC
Start: ? — End: 2018-12-24

## 2018-12-24 MED ORDER — FUROSEMIDE 20 MG PO TABS
20.00 | ORAL_TABLET | ORAL | Status: DC
Start: 2018-12-25 — End: 2018-12-24

## 2018-12-24 MED ORDER — PREDNISONE 5 MG PO TABS
5.00 | ORAL_TABLET | ORAL | Status: DC
Start: 2018-12-25 — End: 2018-12-24

## 2018-12-24 MED ORDER — PANTOPRAZOLE SODIUM 40 MG PO TBEC
40.00 | DELAYED_RELEASE_TABLET | ORAL | Status: DC
Start: 2018-12-25 — End: 2018-12-24

## 2018-12-24 MED ORDER — TRAVOPROST (BAK FREE) 0.004 % OP SOLN
1.00 | OPHTHALMIC | Status: DC
Start: 2018-12-24 — End: 2018-12-24

## 2018-12-24 MED ORDER — PILOCARPINE HCL 1 % OP SOLN
1.00 | OPHTHALMIC | Status: DC
Start: 2018-12-25 — End: 2018-12-24

## 2018-12-24 MED ORDER — WARFARIN SODIUM 2.5 MG PO TABS
2.50 | ORAL_TABLET | ORAL | Status: DC
Start: ? — End: 2018-12-24

## 2018-12-24 MED ORDER — GUAIFENESIN 100 MG/5ML PO SYRP
200.00 | ORAL_SOLUTION | ORAL | Status: DC
Start: ? — End: 2018-12-24

## 2018-12-24 MED ORDER — METOPROLOL SUCCINATE ER 25 MG PO TB24
12.50 | ORAL_TABLET | ORAL | Status: DC
Start: 2018-12-25 — End: 2018-12-24

## 2018-12-24 MED ORDER — CHOLECALCIFEROL 25 MCG (1000 UT) PO TABS
1000.00 | ORAL_TABLET | ORAL | Status: DC
Start: 2018-12-25 — End: 2018-12-24

## 2018-12-24 MED ORDER — POLYETHYLENE GLYCOL 3350 17 G PO PACK
17.00 | PACK | ORAL | Status: DC
Start: ? — End: 2018-12-24

## 2018-12-24 MED ORDER — ONDANSETRON HCL 4 MG/2ML IJ SOLN
4.00 | INTRAMUSCULAR | Status: DC
Start: ? — End: 2018-12-24

## 2018-12-24 MED ORDER — BRIMONIDINE TARTRATE 0.2 % OP SOLN
1.00 | OPHTHALMIC | Status: DC
Start: 2018-12-25 — End: 2018-12-24

## 2018-12-24 MED ORDER — FAMOTIDINE 20 MG PO TABS
20.00 | ORAL_TABLET | ORAL | Status: DC
Start: ? — End: 2018-12-24

## 2018-12-24 MED ORDER — LEVOTHYROXINE SODIUM 75 MCG PO TABS
75.00 | ORAL_TABLET | ORAL | Status: DC
Start: 2018-12-25 — End: 2018-12-24

## 2018-12-25 ENCOUNTER — Telehealth: Payer: Self-pay | Admitting: *Deleted

## 2018-12-25 NOTE — Telephone Encounter (Signed)
Called pt no answer LMOM RTC.../lmb 

## 2018-12-25 NOTE — Telephone Encounter (Signed)
Pt was on the Patient Ping Portal on 12/17/18 @ 7:35 AM pt was admitted to [facility] inpatient Dx w/ Dyspnea, unspecified (R0600). On 12/24/18 @ 6:39 PM she was discharged from [facility] inpatient to Home. Will call pt to follow-up and make hosp f/u appt w/PCP if need.Marland KitchenJohny Chess

## 2018-12-28 NOTE — Telephone Encounter (Signed)
Called pt to verify D/C from [facility] in [place]. Pt states yes she went to ER due to not being able to breathe. She went to visit her daughter. She states she made an appt  W/dr Monica Mora for 3/19. She made appt once she came home. Completed TCM call below.Monica Mora  Transition Care Management Follow-up Telephone Call   Date discharged? 12/24/18   How have you been since you were released from the hospital? Pt states she seem to be doing ok   Do you understand why you were in the hospital? YES   Do you understand the discharge instructions? YES   Where were you discharged to? Home   Items Reviewed:  Medications reviewed: YES, per pt no changes  Allergies reviewed: YES  Dietary changes reviewed: YES, heart healthy  Referrals reviewed: No referral needed   Functional Questionnaire:   Activities of Daily Living (ADLs):   She states she are independent in the following: ambulation, bathing and hygiene, feeding, continence, grooming, toileting and dressing States she doesn't require assistance    Any transportation issues/concerns?: NO   Any patient concerns? NO   Confirmed importance and date/time of follow-up visits scheduled YES, 01/07/19  Provider Appointment booked with Dr. Jenny Mora  Confirmed with patient if condition begins to worsen call PCP or go to the ER.  Patient was given the office number and encouraged to call back with question or concerns.  : YES

## 2019-01-02 ENCOUNTER — Other Ambulatory Visit: Payer: Self-pay | Admitting: Internal Medicine

## 2019-01-05 ENCOUNTER — Telehealth: Payer: Self-pay | Admitting: *Deleted

## 2019-01-05 NOTE — Telephone Encounter (Signed)
Rec'd call from pt she is wanting to know should she keep her appt that is schedule for this Thursday 3/19. Pt was admitted to [facility] in [place] on 12/17/18. Dx w/Dyspnea. She states she had some fluid build up. Was given rx for Furosemide 20 mg have 2 weeks left. Due to the virus that going around she is wanting to know should she come in.Marland KitchenJohny Mora

## 2019-01-05 NOTE — Telephone Encounter (Signed)
Oh yes, that would be a good idea.  No need to worry about the coronavirus since we are sending those possible cases to be tested elsewhere

## 2019-01-06 NOTE — Telephone Encounter (Signed)
Called pt no answer LMOM w/MD response../lmb 

## 2019-01-07 ENCOUNTER — Encounter: Payer: Self-pay | Admitting: Internal Medicine

## 2019-01-07 ENCOUNTER — Other Ambulatory Visit: Payer: Self-pay

## 2019-01-07 ENCOUNTER — Telehealth: Payer: Self-pay | Admitting: *Deleted

## 2019-01-07 ENCOUNTER — Ambulatory Visit (INDEPENDENT_AMBULATORY_CARE_PROVIDER_SITE_OTHER): Payer: Medicare Other | Admitting: Internal Medicine

## 2019-01-07 ENCOUNTER — Ambulatory Visit: Payer: Medicare Other

## 2019-01-07 VITALS — BP 116/76 | HR 80 | Temp 97.8°F | Ht 66.0 in | Wt 203.0 lb

## 2019-01-07 DIAGNOSIS — I4891 Unspecified atrial fibrillation: Secondary | ICD-10-CM

## 2019-01-07 DIAGNOSIS — I10 Essential (primary) hypertension: Secondary | ICD-10-CM

## 2019-01-07 DIAGNOSIS — I2729 Other secondary pulmonary hypertension: Secondary | ICD-10-CM | POA: Diagnosis not present

## 2019-01-07 DIAGNOSIS — J9851 Mediastinitis: Secondary | ICD-10-CM | POA: Diagnosis not present

## 2019-01-07 DIAGNOSIS — J849 Interstitial pulmonary disease, unspecified: Secondary | ICD-10-CM | POA: Insufficient documentation

## 2019-01-07 DIAGNOSIS — Z7901 Long term (current) use of anticoagulants: Secondary | ICD-10-CM | POA: Diagnosis not present

## 2019-01-07 LAB — POCT INR: INR: 2.2 (ref 2.0–3.0)

## 2019-01-07 MED ORDER — FUROSEMIDE 20 MG PO TABS: 20.0000 mg | ORAL_TABLET | Freq: Every day | ORAL | 3 refills | Status: DC

## 2019-01-07 NOTE — Patient Instructions (Signed)
Description    Continue on same dosage 1/2 tablet daily except 1 tablet on Tuesdays, Thursdays and Saturdays.  Continue to eat 1 serving of greens per week. Recheck in 4 weeks.  Call us with any medication changes or concerns to [phone]

## 2019-01-07 NOTE — Progress Notes (Signed)
Subjective:    Patient ID: Monica Mora, female    DOB: 14-Apr-1935, 84 y.o.   MRN: [id number]  HPI   84 year old female who ran out of her diuretic for 1 mo who was  hospd 2/27 - 3/5 at [facility] with h/o chronic SOB likely due to ILD/pulmonary HTN on 4LO2, HTN, dCHF, pAF on warfarin, hypothyroidism, GERD presents with increased SOB. s/p [facility] 03/02.Admitted to hospitalist service with pulmonology consult. CT chest demonstrated bronchiectasis with persistent right lower lobe dense consolidation/mass, mild lymphadenopathy and scattered pulmonary nodules stable from prior studies. Echocardiogram demonstrated preserved EF was suspicious for severe pulmonary hypertension. She was diuresed which resulted in improvement in her hypoxic respiratory failure and cardiology was consulted for right heart catheterization which was performed on 12/21/2018 yielding findings of severe pulmonary hypertension. Her case was reviewed extensively with multiple [facility] pulmonologists as well as Dr. 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 Unfortunately treatment options are limited and maintaining euvolemia while supplementing with home oxygen and avoiding strenuous activity is recommended by her outpatient pulmonology group. She has follow-up with [facility] pulmonology on 02/10/2019. He may benefit from palliative care consultation as an outpatient. Patient also was seen for evaluation by inpatient OT/PT services to assess for changes in functional abilities from baseline, impact on ADL's, and to determine appropriate level of assistance needed for maximizing independence and safety for ADL's at discharge.  Pt performed seated and standing ADL tasks without LOB/SOB noted. Patient educated in energy conservation techniques to facilitate safety with ADL's at home. Anticipating patient staying with niece in [place] few days. Pt has good UB strength, ROM, coordination  and sensation. No acute OT needs.  Occupational Therapy Plan and Treatment: Recommendations: Discontinue OT Plan established with: patient, treatment team Patient/Family Plan Agreement: are in agreement.  Today states wt overall down from 217 down to 196 after diuresis, has gained a few lbs back, working on the low sodium.   Has f/u at [facility] apr 22 per pt.  Home nebs  Bid cont to seem to help, as well as the proair.  Pt denies chest pain, increased sob or doe, wheezing, orthopnea, PND, increased LE swelling, palpitations, dizziness or syncope. Needs refill of her diuretic. No longer taking micardis.  Needs lasix 20 qd refill. Past Medical History:  Diagnosis Date  . Abnormal chest x-ray   . Atrial fibrillation ([place])   . CHF (congestive heart failure) ([place])   . Constrictive pericarditis    s/p pericardial window in 2002 in [place]  . Glaucoma   . H/O: hysterectomy 1992  . Heart murmur   . Hyperlipidemia   . Nontoxic multinodular goiter   . Other voice and resonance disorders   . TB (tuberculosis) 2002   positive skin test, negative chest x-ray  . Unspecified essential hypertension   . Unspecified hypothyroidism    Past Surgical History:  Procedure Laterality Date  . ABDOMINAL HYSTERECTOMY  1992  . PERICARDIAL WINDOW  2002  . Radioactive Iodine for hyperthyroidism/Multinodular goiter     s/p  . VIDEO BRONCHOSCOPY Bilateral 01/21/2018   Procedure: VIDEO BRONCHOSCOPY WITH FLUORO;  Surgeon: Tanda Rockers, MD;  Location: [facility];  Service: Cardiopulmonary;  Laterality: Bilateral;  . VIDEO BRONCHOSCOPY WITH ENDOBRONCHIAL NAVIGATION Right 02/11/2018   Procedure: VIDEO BRONCHOSCOPY WITH ENDOBRONCHIAL NAVIGATION;  Surgeon: Collene Gobble, MD;  Location: [place];  Service: Thoracic;  Laterality: Right;  Marland Kitchen VIDEO BRONCHOSCOPY WITH ENDOBRONCHIAL  ULTRASOUND Right 02/11/2018   Procedure: VIDEO BRONCHOSCOPY WITH ENDOBRONCHIAL ULTRASOUND;  Surgeon: Collene Gobble, MD;  Location: [place];   Service: Thoracic;  Laterality: Right;    reports that she quit smoking about 40 years ago. Her smoking use included cigarettes. She has a 10.50 pack-year smoking history. She has never used smokeless tobacco. She reports that she does not drink alcohol or use drugs. family history includes Arthritis in her father and mother; Asthma in her brother; Cancer in her sister; Cervical cancer in her sister. No Known Allergies Current Outpatient Medications on File Prior to Visit  Medication Sig Dispense Refill  . albuterol (PROAIR HFA) 108 (90 Base) MCG/ACT inhaler Inhale 2 puffs into the lungs every 6 (six) hours as needed for wheezing or shortness of breath. 1 Inhaler 3  . brimonidine (ALPHAGAN) 0.2 % ophthalmic solution Place 1 drop into the left eye 3 (three) times daily.    . chlorpheniramine (CHLOR-TRIMETON) 4 MG tablet Take 4 mg by mouth daily.    . Cholecalciferol (VITAMIN D) 1000 UNITS capsule Take 1,000 Units by mouth daily.      . dorzolamide-timolol (COSOPT) 22.3-6.8 MG/ML ophthalmic solution Place 1 drop into both eyes 2 (two) times daily.     . famotidine (PEPCID) 20 MG tablet TAKE 1 TABLET BY MOUTH EVERYDAY AT BEDTIME 90 tablet 0  . furosemide (LASIX) 20 MG tablet Take 1 tablet (20 mg total) by mouth daily. 30 tablet 6  . hydrochlorothiazide (MICROZIDE) 12.5 MG capsule Take 1 capsule (12.5 mg total) by mouth daily. 90 capsule 0  . levothyroxine (SYNTHROID, LEVOTHROID) 75 MCG tablet TAKE 1 TABLET BY MOUTH EVERY DAY 90 tablet 1  . pantoprazole (PROTONIX) 40 MG tablet TAKE 1 TABLET (40 MG TOTAL) BY MOUTH DAILY. TAKE 30-60 MIN BEFORE FIRST MEAL OF THE DAY 90 tablet 1  . pilocarpine (PILOCAR) 1 % ophthalmic solution Place 1 drop into the left eye 3 (three) times daily.    . predniSONE (DELTASONE) 5 MG tablet Take 3 tablets (15 mg total) by mouth daily with breakfast. 90 tablet 1  . PROAIR HFA 108 (90 Base) MCG/ACT inhaler TAKE 2 PUFFS BY MOUTH EVERY 6 HOURS AS NEEDED FOR WHEEZE OR SHORTNESS OF  BREATH 8.5 Inhaler 3  . TOPROL XL 50 MG 24 hr tablet TAKE 1 TABLET BY MOUTH EVERY DAY WITH OR IMMEDIATELY FOLLOWING A MEAL 30 tablet 0  . Travoprost, BAK Free, (TRAVATAN) 0.004 % SOLN ophthalmic solution Place 1 drop into both eyes at bedtime.    . vitamin B-12 (CYANOCOBALAMIN) 500 MCG tablet Take 500 mcg by mouth daily.    Marland Kitchen warfarin (COUMADIN) 5 MG tablet TAKE AS DIRECTED BY COUMADIN CLINIC 80 tablet 1   No current facility-administered medications on file prior to visit.    Review of Systems  Constitutional: Negative for other unusual diaphoresis or sweats HENT: Negative for ear discharge or swelling Eyes: Negative for other worsening visual disturbances Respiratory: Negative for stridor or other swelling  Gastrointestinal: Negative for worsening distension or other blood Genitourinary: Negative for retention or other urinary change Musculoskeletal: Negative for other MSK pain or swelling Skin: Negative for color change or other new lesions Neurological: Negative for worsening tremors and other numbness  Psychiatric/Behavioral: Negative for worsening agitation or other fatigue All other system neg per pt    Objective:   Physical Exam BP 116/76   Pulse 80   Temp 97.8 F (36.6 C) (Oral)   Ht 5\' 6"  (1.676 m)   Wt  203 lb (92.1 kg)   SpO2 91%   BMI 32.77 kg/m  VS noted,  Constitutional: Pt appears in NAD HENT: Head: NCAT.  Right Ear: External ear normal.  Left Ear: External ear normal.  Eyes: . Pupils are equal, round, and reactive to light. Conjunctivae and EOM are normal Nose: without d/c or deformity Neck: Neck supple. Gross normal ROM Cardiovascular: Normal rate and regular rhythm.   Pulmonary/Chest: Effort normal and breath sounds without rales or wheezing.  Abd:  Soft, NT, ND, + BS, no organomegaly Neurological: Pt is alert. At baseline orientation, motor grossly intact Skin: Skin is warm. No rashes, other new lesions, trace left > right ankle edema Psychiatric: Pt  behavior is normal without agitation  No other exam findings Lab Results  Component Value Date   WBC 16.2 (H) 07/31/2018   HGB 15.3 (H) 07/31/2018   HCT 45.9 07/31/2018   PLT 247.0 07/31/2018   GLUCOSE 91 07/31/2018   CHOL 191 08/08/2017   TRIG 159.0 (H) 08/08/2017   HDL 35.50 (L) 08/08/2017   LDLDIRECT 149.7 07/17/2012   LDLCALC 123 (H) 08/08/2017   ALT 24 02/05/2018   AST 23 02/05/2018   NA 132 (L) 07/31/2018   K 3.8 07/31/2018   CL 96 07/31/2018   CREATININE 1.09 07/31/2018   BUN 16 07/31/2018   CO2 30 07/31/2018   TSH 1.02 08/05/2018   INR 2.5 11/12/2018        Assessment & Plan:

## 2019-01-07 NOTE — Patient Instructions (Signed)
Please continue all other medications as before, and refills have been done if requested. - the lasix  Please have the pharmacy call with any other refills you may need.  Please continue your efforts at being more active, low cholesterol diet, and weight control.  Please keep your appointments with your specialists as you may have planned  Please return in 6 months, or sooner if needed

## 2019-01-07 NOTE — Assessment & Plan Note (Signed)
Likely related to ILD cause, has + ANA, defer further evaluation for now, tx options limited

## 2019-01-07 NOTE — Assessment & Plan Note (Signed)
Ok to continue the lasix 20 qd

## 2019-01-07 NOTE — Assessment & Plan Note (Signed)
Has [facility] f/u soon

## 2019-01-07 NOTE — Telephone Encounter (Signed)
1. Have you recently travelled abroad or to [place], [place], or [place]? NO 2. Do you currently have a fever? NO 3. Have you been in contact with someone that is currently pending confirmation of COVID19 testing or has been confirmed to have the COVID19 virus? NO 4. Are you currently experiencing fatigue or cough? NO 5. Are you currently experiencing new or worsening shortness of breath at rest or with minimal activity? NO 6. Have you been in contact with someone that was recently sick with fever/cough/fatigue? NO   **A score of 4 or more should result in cancellation of the pts cardiology appt  **A score of 2 should be provided a mask prior to admission into the lobby  **TRAVEL to a high risk area or contact with a confirmed case should stay at home, away from confirmed patient, monitor symptoms, and reach out to PCP for evisit, additional testing.   **ALL PTS WITH FEVER SHOULD BE REFERRED TO PCP FOR EVISIT  Pt advised that we are restricting visitors at this time and request that only patients present for check-in prior to their appointment. All other visitors should remain in their car. If necessary, only one visitor may come with the patient into the building. For everyone's safety, all patients and visitors entering our practice area should expect to be screened again prior to entering our waiting area.   Rescheduled pt's appt to this afternoon. Prescreened the pt.

## 2019-01-07 NOTE — Assessment & Plan Note (Signed)
stable overall by history and exam, recent data reviewed with pt, and pt to continue medical treatment as before,  to f/u any worsening symptoms or concerns  

## 2019-01-15 ENCOUNTER — Encounter: Payer: Self-pay | Admitting: Internal Medicine

## 2019-01-16 DIAGNOSIS — J9611 Chronic respiratory failure with hypoxia: Secondary | ICD-10-CM | POA: Diagnosis not present

## 2019-01-20 DIAGNOSIS — J9611 Chronic respiratory failure with hypoxia: Secondary | ICD-10-CM | POA: Diagnosis not present

## 2019-01-25 ENCOUNTER — Encounter: Payer: Self-pay | Admitting: Internal Medicine

## 2019-01-25 ENCOUNTER — Ambulatory Visit: Payer: Self-pay

## 2019-01-25 ENCOUNTER — Ambulatory Visit (INDEPENDENT_AMBULATORY_CARE_PROVIDER_SITE_OTHER): Payer: Medicare Other | Admitting: Internal Medicine

## 2019-01-25 DIAGNOSIS — I10 Essential (primary) hypertension: Secondary | ICD-10-CM

## 2019-01-25 DIAGNOSIS — E890 Postprocedural hypothyroidism: Secondary | ICD-10-CM

## 2019-01-25 DIAGNOSIS — R609 Edema, unspecified: Secondary | ICD-10-CM | POA: Diagnosis not present

## 2019-01-25 NOTE — Telephone Encounter (Signed)
Incoming call from Patient with a complaint of her leg swelling from ankle to knee.   This stared on  Friday.  Not necessarily Pain,   reports discomfort. Pain in the instep.  Pulmonary issues  Hospitalized.  Retained fluid. Revieded protocal with patient Transferred Patient to [facility] to scheduled virtual visited.  Patient voiced understanding.    Reason for Disposition . [1] MODERATE pain (e.g., interferes with normal activities, limping) AND [2] present > 3 days  Answer Assessment - Initial Assessment Questions 1. LOCATION: "Where is the swelling located?"  (e.g., left, right, both knees)     Knee,  Ankle  2. SIZE and DESCRIPTION: "What does the swelling look like?"  (e.g., entire knee, localized)     Ankle to knee 3. ONSET: "When did the swelling start?" "Does it come and go, or is it there all the time?"    Friday 4. PAIN: "Is there any pain?" If so, ask: "How bad is it?" (Scale 1-10; or mild, moderate, severe)    Pain from  Instep to other.  tightineng 5. SETTING: "Has there been any recent work, exercise or other activity that involved that part of the body?"     denies 6. AGGRAVATING FACTORS: "What makes the knee swelling worse?" (e.g., walking, climbing stairs, running)     Denies  7. ASSOCIATED SYMPTOMS: "Is there any pain or redness?"     denies 8. OTHER SYMPTOMS: "Do you have any other symptoms?" (e.g., chest pain, difficulty breathing, fever, calf pain)     Pulmonary , hospitalized fluid retained.   9. PREGNANCY: "Is there any chance you are pregnant?" "When was your last menstrual period?"     Na  Protocols used: KNEE SWELLING-A-AH

## 2019-01-25 NOTE — Assessment & Plan Note (Signed)
stable overall by history and exam, recent data reviewed with pt, and pt to continue medical treatment as before,  to f/u any worsening symptoms or concerns  

## 2019-01-25 NOTE — Assessment & Plan Note (Signed)
Related to mediastinitis and ILD and pulm HTN - pt advised to cont low salt diet, keep legs elevated when sitting, wear compression stocking during daytime, avoiding long periods of standing, check daily wts, and decrease any extra fluids over daily need; also ok to increase the lasix to 40 mg po qd for 3 days, then back to 20 mg, check daily wts, and call on Friday if no change in edema or wt not decreased by 1/2 - 1 lb per day

## 2019-01-25 NOTE — Patient Instructions (Signed)
See above

## 2019-01-25 NOTE — Progress Notes (Signed)
Virtual Visit via Phone only Note  I connected with Monica Mora on 01/25/19 at  3:00 PM EDT by a video enabled telemedicine application and verified that I am speaking with the correct person using two identifiers. Pt is at home, I am in office, and no other persons on the line   I discussed the limitations of evaluation and management by telemedicine and the availability of in person appointments. The patient expressed understanding and agreed to proceed.  History of Present Illness: Pt states her bilateral leg edema as noted last visit is now mildly worsened, as the swelling now seems larger up to but not involving the knees.  Pt denies chest pain, increased sob or doe, wheezing, orthopnea, PND, palpitations, dizziness or syncope.  Pt denies polydipsia, polyuria, though admits to increased po intake on purpose to try to "flush everything out".   Pt denies fever, wt loss, night sweats, loss of appetite, or other constitutional symptoms, though has not been checking her daily wts.  Has not yet started to wear the compression stockings, but has been diligent about trying to avoid salt in diet.  BP at home < 140/90,  Denies hyper or hypo thyroid symptoms such as voice, skin or hair change. Past Medical History:  Diagnosis Date  . Abnormal chest x-ray   . Atrial fibrillation ([place])   . CHF (congestive heart failure) ([place])   . Constrictive pericarditis    s/p pericardial window in 2002 in [place]  . Glaucoma   . H/O: hysterectomy 1992  . Heart murmur   . Hyperlipidemia   . Nontoxic multinodular goiter   . Other voice and resonance disorders   . TB (tuberculosis) 2002   positive skin test, negative chest x-ray  . Unspecified essential hypertension   . Unspecified hypothyroidism    Past Surgical History:  Procedure Laterality Date  . ABDOMINAL HYSTERECTOMY  1992  . PERICARDIAL WINDOW  2002  . Radioactive Iodine for hyperthyroidism/Multinodular goiter     s/p  . VIDEO BRONCHOSCOPY  Bilateral 01/21/2018   Procedure: VIDEO BRONCHOSCOPY WITH FLUORO;  Surgeon: Tanda Rockers, MD;  Location: [facility];  Service: Cardiopulmonary;  Laterality: Bilateral;  . VIDEO BRONCHOSCOPY WITH ENDOBRONCHIAL NAVIGATION Right 02/11/2018   Procedure: VIDEO BRONCHOSCOPY WITH ENDOBRONCHIAL NAVIGATION;  Surgeon: Collene Gobble, MD;  Location: [place];  Service: Thoracic;  Laterality: Right;  Marland Kitchen VIDEO BRONCHOSCOPY WITH ENDOBRONCHIAL ULTRASOUND Right 02/11/2018   Procedure: VIDEO BRONCHOSCOPY WITH ENDOBRONCHIAL ULTRASOUND;  Surgeon: Collene Gobble, MD;  Location: [place];  Service: Thoracic;  Laterality: Right;    reports that she quit smoking about 40 years ago. Her smoking use included cigarettes. She has a 10.50 pack-year smoking history. She has never used smokeless tobacco. She reports that she does not drink alcohol or use drugs. family history includes Arthritis in her father and mother; Asthma in her brother; Cancer in her sister; Cervical cancer in her sister. No Known Allergies Current Outpatient Medications on File Prior to Visit  Medication Sig Dispense Refill  . albuterol (PROAIR HFA) 108 (90 Base) MCG/ACT inhaler Inhale 2 puffs into the lungs every 6 (six) hours as needed for wheezing or shortness of breath. 1 Inhaler 3  . brimonidine (ALPHAGAN) 0.2 % ophthalmic solution Place 1 drop into the left eye 3 (three) times daily.    . chlorpheniramine (CHLOR-TRIMETON) 4 MG tablet Take 4 mg by mouth daily.    . Cholecalciferol (VITAMIN D) 1000 UNITS capsule Take 1,000 Units by mouth daily.      Marland Kitchen  dorzolamide-timolol (COSOPT) 22.3-6.8 MG/ML ophthalmic solution Place 1 drop into both eyes 2 (two) times daily.     . famotidine (PEPCID) 20 MG tablet TAKE 1 TABLET BY MOUTH EVERYDAY AT BEDTIME 90 tablet 0  . furosemide (LASIX) 20 MG tablet Take 1 tablet (20 mg total) by mouth daily. 90 tablet 3  . levothyroxine (SYNTHROID, LEVOTHROID) 75 MCG tablet TAKE 1 TABLET BY MOUTH EVERY DAY 90 tablet 1  .  pantoprazole (PROTONIX) 40 MG tablet TAKE 1 TABLET (40 MG TOTAL) BY MOUTH DAILY. TAKE 30-60 MIN BEFORE FIRST MEAL OF THE DAY 90 tablet 1  . pilocarpine (PILOCAR) 1 % ophthalmic solution Place 1 drop into the left eye 3 (three) times daily.    . predniSONE (DELTASONE) 5 MG tablet Take 3 tablets (15 mg total) by mouth daily with breakfast. 90 tablet 1  . TOPROL XL 50 MG 24 hr tablet TAKE 1 TABLET BY MOUTH EVERY DAY WITH OR IMMEDIATELY FOLLOWING A MEAL 30 tablet 0  . Travoprost, BAK Free, (TRAVATAN) 0.004 % SOLN ophthalmic solution Place 1 drop into both eyes at bedtime.    . vitamin B-12 (CYANOCOBALAMIN) 500 MCG tablet Take 500 mcg by mouth daily.    Marland Kitchen warfarin (COUMADIN) 5 MG tablet TAKE AS DIRECTED BY COUMADIN CLINIC 80 tablet 1   No current facility-administered medications on file prior to visit.        Observations/Objective: Not able to observe over the phone Lab Results  Component Value Date   WBC 16.2 (H) 07/31/2018   HGB 15.3 (H) 07/31/2018   HCT 45.9 07/31/2018   PLT 247.0 07/31/2018   GLUCOSE 91 07/31/2018   CHOL 191 08/08/2017   TRIG 159.0 (H) 08/08/2017   HDL 35.50 (L) 08/08/2017   LDLDIRECT 149.7 07/17/2012   LDLCALC 123 (H) 08/08/2017   ALT 24 02/05/2018   AST 23 02/05/2018   NA 132 (L) 07/31/2018   K 3.8 07/31/2018   CL 96 07/31/2018   CREATININE 1.09 07/31/2018   BUN 16 07/31/2018   CO2 30 07/31/2018   TSH 1.02 08/05/2018   INR 2.2 01/07/2019   Assessment and Plan: See notes  Follow Up Instructions: See notes   I discussed the assessment and treatment plan with the patient. The patient was provided an opportunity to ask questions and all were answered. The patient agreed with the plan and demonstrated an understanding of the instructions.   The patient was advised to call back or seek an in-person evaluation if the symptoms worsen or if the condition fails to improve as anticipated.   Cathlean Cower, MD

## 2019-01-25 NOTE — Telephone Encounter (Signed)
Pt is scheduled for today  °

## 2019-01-29 ENCOUNTER — Telehealth: Payer: Self-pay | Admitting: Internal Medicine

## 2019-01-29 NOTE — Telephone Encounter (Signed)
Copied from [place] [id number]. Topic: Quick Communication - See Telephone Encounter >> Jan 29, 2019  2:27 PM Nils Flack wrote: CRM for notification. See Telephone encounter for: 01/29/19. Pt called back  Fluid pills were increased for tues, wed, thurs. He wanted to know how retention was doing,  Pt sees some improvement Pt weght tues 201                  wed  201                   thurs 200 Cb is [phone] or [phone]

## 2019-02-01 NOTE — Telephone Encounter (Signed)
Ok to continue the 20 mg lasix daily for now, but follow wt and swelling, and call for wt increase of 3 - 5 lbs or worsening swelling

## 2019-02-01 NOTE — Telephone Encounter (Signed)
Pt has been informed and expressed understanding.  

## 2019-02-01 NOTE — Telephone Encounter (Signed)
Please advise 

## 2019-02-02 ENCOUNTER — Telehealth: Payer: Self-pay

## 2019-02-02 NOTE — Telephone Encounter (Signed)
LMOM FOR RESCHEDULE AND PRESCREEN AND DRIVEUP AWARE

## 2019-02-08 DIAGNOSIS — H401112 Primary open-angle glaucoma, right eye, moderate stage: Secondary | ICD-10-CM | POA: Diagnosis not present

## 2019-02-08 DIAGNOSIS — H401123 Primary open-angle glaucoma, left eye, severe stage: Secondary | ICD-10-CM | POA: Diagnosis not present

## 2019-02-08 DIAGNOSIS — H25813 Combined forms of age-related cataract, bilateral: Secondary | ICD-10-CM | POA: Diagnosis not present

## 2019-02-09 ENCOUNTER — Telehealth: Payer: Self-pay

## 2019-02-09 ENCOUNTER — Telehealth: Payer: Self-pay | Admitting: *Deleted

## 2019-02-09 NOTE — Telephone Encounter (Signed)
lmom for prescreen  

## 2019-02-09 NOTE — Telephone Encounter (Signed)
1. Do you currently have a fever? No (yes = cancel and refer to pcp for e-visit) 2. Have you recently travelled on a cruise, internationally, or to [place], [place], [place], [place], [place], or [place], [place] [employer]) ? No (yes = cancel, stay home, monitor symptoms, and contact pcp or initiate e-visit if symptoms develop) 3. Have you been in contact with someone that is currently pending confirmation of Covid19 testing or has been confirmed to have the [place] virus? No (yes = cancel, stay home, away from tested individual, monitor symptoms, and contact pcp or initiate e-visit if symptoms develop) 4. Are you currently experiencing fatigue or cough? No (yes = pt should be prepared to have a mask placed at the time of their visit).  Pt. Advised that we are restricting visitors at this time and anyone present in the vehicle should meet the above criteria as well. Advised that visit will be at curbside for finger stick ONLY and will receive call with instructions. Pt also advised to please bring own pen for signature of arrival document. ,

## 2019-02-11 ENCOUNTER — Other Ambulatory Visit: Payer: Self-pay

## 2019-02-11 ENCOUNTER — Ambulatory Visit (INDEPENDENT_AMBULATORY_CARE_PROVIDER_SITE_OTHER): Payer: Medicare Other | Admitting: Pharmacist

## 2019-02-11 DIAGNOSIS — Z7901 Long term (current) use of anticoagulants: Secondary | ICD-10-CM

## 2019-02-11 DIAGNOSIS — I4891 Unspecified atrial fibrillation: Secondary | ICD-10-CM

## 2019-02-11 LAB — POCT INR: INR: 2.5 (ref 2.0–3.0)

## 2019-02-16 DIAGNOSIS — J9611 Chronic respiratory failure with hypoxia: Secondary | ICD-10-CM | POA: Diagnosis not present

## 2019-02-19 DIAGNOSIS — J9611 Chronic respiratory failure with hypoxia: Secondary | ICD-10-CM | POA: Diagnosis not present

## 2019-02-22 ENCOUNTER — Other Ambulatory Visit: Payer: Self-pay | Admitting: Internal Medicine

## 2019-03-08 ENCOUNTER — Other Ambulatory Visit: Payer: Self-pay | Admitting: Internal Medicine

## 2019-03-18 DIAGNOSIS — J9611 Chronic respiratory failure with hypoxia: Secondary | ICD-10-CM | POA: Diagnosis not present

## 2019-03-22 ENCOUNTER — Telehealth: Payer: Self-pay

## 2019-03-22 DIAGNOSIS — J9611 Chronic respiratory failure with hypoxia: Secondary | ICD-10-CM | POA: Diagnosis not present

## 2019-03-22 NOTE — Telephone Encounter (Signed)
lmom for prescreen  

## 2019-03-30 ENCOUNTER — Encounter: Payer: Self-pay | Admitting: Internal Medicine

## 2019-03-30 ENCOUNTER — Other Ambulatory Visit: Payer: Self-pay

## 2019-03-30 ENCOUNTER — Ambulatory Visit (INDEPENDENT_AMBULATORY_CARE_PROVIDER_SITE_OTHER): Payer: Medicare Other | Admitting: Internal Medicine

## 2019-03-30 ENCOUNTER — Other Ambulatory Visit (INDEPENDENT_AMBULATORY_CARE_PROVIDER_SITE_OTHER): Payer: Medicare Other

## 2019-03-30 VITALS — BP 120/78 | HR 63 | Temp 97.9°F | Ht 66.0 in

## 2019-03-30 DIAGNOSIS — E890 Postprocedural hypothyroidism: Secondary | ICD-10-CM

## 2019-03-30 DIAGNOSIS — Z0001 Encounter for general adult medical examination with abnormal findings: Secondary | ICD-10-CM

## 2019-03-30 DIAGNOSIS — Z23 Encounter for immunization: Secondary | ICD-10-CM

## 2019-03-30 DIAGNOSIS — E611 Iron deficiency: Secondary | ICD-10-CM

## 2019-03-30 DIAGNOSIS — E538 Deficiency of other specified B group vitamins: Secondary | ICD-10-CM | POA: Diagnosis not present

## 2019-03-30 DIAGNOSIS — R739 Hyperglycemia, unspecified: Secondary | ICD-10-CM | POA: Diagnosis not present

## 2019-03-30 DIAGNOSIS — E559 Vitamin D deficiency, unspecified: Secondary | ICD-10-CM | POA: Diagnosis not present

## 2019-03-30 DIAGNOSIS — E785 Hyperlipidemia, unspecified: Secondary | ICD-10-CM | POA: Diagnosis not present

## 2019-03-30 DIAGNOSIS — I10 Essential (primary) hypertension: Secondary | ICD-10-CM | POA: Diagnosis not present

## 2019-03-30 DIAGNOSIS — R609 Edema, unspecified: Secondary | ICD-10-CM | POA: Diagnosis not present

## 2019-03-30 LAB — CBC WITH DIFFERENTIAL/PLATELET
Basophils Absolute: 0.1 10*3/uL (ref 0.0–0.1)
Basophils Relative: 0.9 % (ref 0.0–3.0)
Eosinophils Absolute: 0.1 10*3/uL (ref 0.0–0.7)
Eosinophils Relative: 1.1 % (ref 0.0–5.0)
HCT: 43.8 % (ref 36.0–46.0)
Hemoglobin: 14.6 g/dL (ref 12.0–15.0)
Lymphocytes Relative: 18.2 % (ref 12.0–46.0)
Lymphs Abs: 1.2 10*3/uL (ref 0.7–4.0)
MCHC: 33.2 g/dL (ref 30.0–36.0)
MCV: 99.5 fl (ref 78.0–100.0)
Monocytes Absolute: 0.4 10*3/uL (ref 0.1–1.0)
Monocytes Relative: 6 % (ref 3.0–12.0)
Neutro Abs: 4.7 10*3/uL (ref 1.4–7.7)
Neutrophils Relative %: 73.8 % (ref 43.0–77.0)
Platelets: 137 10*3/uL — ABNORMAL LOW (ref 150.0–400.0)
RBC: 4.4 Mil/uL (ref 3.87–5.11)
RDW: 17.4 % — ABNORMAL HIGH (ref 11.5–15.5)
WBC: 6.4 10*3/uL (ref 4.0–10.5)

## 2019-03-30 MED ORDER — FUROSEMIDE 40 MG PO TABS: ORAL_TABLET | ORAL | 11 refills | Status: DC

## 2019-03-30 NOTE — Patient Instructions (Addendum)
You the Tdap tetanus shot today  We'll ask Shirron to check your weight today  OK to increase the furosemide (lasix) to a total of 40 mg in the AM, but then also 40 mg in the PM if have persistent swelling  Please continue all other medications as before, and refills have been done if requested.  Please have the pharmacy call with any other refills you may need.  Please continue your efforts at being more active, low cholesterol diet, and weight control.  You are otherwise up to date with prevention measures today.  Please keep your appointments with your specialists as you may have planned  Please go to the LAB in the Basement (turn left off the elevator) for the tests to be done today  You will be contacted by phone if any changes need to be made immediately.  Otherwise, you will receive a letter about your results with an explanation, but please check with MyChart first.  Please remember to sign up for MyChart if you have not done so, as this will be important to you in the future with finding out test results, communicating by private email, and scheduling acute appointments online when needed.

## 2019-03-30 NOTE — Progress Notes (Signed)
Subjective:    Patient ID: Monica Mora, female    DOB: 06/06/1935, 84 y.o.   MRN: [id number]  HPI  Here for wellness and f/u;  Overall doing ok.  Pt denies neurological change such as new headache, facial or extremity weakness.  Pt denies polydipsia, polyuria, or low sugar symptoms. Pt states overall good compliance with treatment and medications, good tolerability, and has been trying to follow appropriate diet.  Pt denies worsening depressive symptoms, suicidal ideation or panic. No fever, night sweats, wt loss, loss of appetite, or other constitutional symptoms.  Pt states good ability with ADL's, has low fall risk, home safety reviewed and adequate, no other significant changes in hearing or vision, and only occasionally active with exercise. Wt Readings from Last 3 Encounters:  01/07/19 203 lb (92.1 kg)  08/05/18 216 lb 9.6 oz (98.2 kg)  07/31/18 217 lb (98.4 kg)  O2 sat 92-93% at rest at home. Pt denies chest pain, increased sob or doe, wheezing, orthopnea, PND, palpitations, dizziness or syncope, but unfortuanately with worsening bilat LE edema. Past Medical History:  Diagnosis Date  . Abnormal chest x-ray   . Atrial fibrillation ([place])   . CHF (congestive heart failure) ([place])   . Constrictive pericarditis    s/p pericardial window in 2002 in [place]  . Glaucoma   . H/O: hysterectomy 1992  . Heart murmur   . Hyperlipidemia   . Nontoxic multinodular goiter   . Other voice and resonance disorders   . TB (tuberculosis) 2002   positive skin test, negative chest x-ray  . Unspecified essential hypertension   . Unspecified hypothyroidism    Past Surgical History:  Procedure Laterality Date  . ABDOMINAL HYSTERECTOMY  1992  . PERICARDIAL WINDOW  2002  . Radioactive Iodine for hyperthyroidism/Multinodular goiter     s/p  . VIDEO BRONCHOSCOPY Bilateral 01/21/2018   Procedure: VIDEO BRONCHOSCOPY WITH FLUORO;  Surgeon: Tanda Rockers, MD;  Location: [facility];  Service:  Cardiopulmonary;  Laterality: Bilateral;  . VIDEO BRONCHOSCOPY WITH ENDOBRONCHIAL NAVIGATION Right 02/11/2018   Procedure: VIDEO BRONCHOSCOPY WITH ENDOBRONCHIAL NAVIGATION;  Surgeon: Collene Gobble, MD;  Location: [place];  Service: Thoracic;  Laterality: Right;  Marland Kitchen VIDEO BRONCHOSCOPY WITH ENDOBRONCHIAL ULTRASOUND Right 02/11/2018   Procedure: VIDEO BRONCHOSCOPY WITH ENDOBRONCHIAL ULTRASOUND;  Surgeon: Collene Gobble, MD;  Location: [place];  Service: Thoracic;  Laterality: Right;    reports that she quit smoking about 40 years ago. Her smoking use included cigarettes. She has a 10.50 pack-year smoking history. She has never used smokeless tobacco. She reports that she does not drink alcohol or use drugs. family history includes Arthritis in her father and mother; Asthma in her brother; Cancer in her sister; Cervical cancer in her sister. No Known Allergies Current Outpatient Medications on File Prior to Visit  Medication Sig Dispense Refill  . albuterol (PROAIR HFA) 108 (90 Base) MCG/ACT inhaler Inhale 2 puffs into the lungs every 6 (six) hours as needed for wheezing or shortness of breath. 1 Inhaler 3  . brimonidine (ALPHAGAN) 0.2 % ophthalmic solution Place 1 drop into the left eye 3 (three) times daily.    . Cholecalciferol (VITAMIN D) 1000 UNITS capsule Take 1,000 Units by mouth daily.      . dorzolamide-timolol (COSOPT) 22.3-6.8 MG/ML ophthalmic solution Place 1 drop into both eyes 2 (two) times daily.     . famotidine (PEPCID) 20 MG tablet TAKE 1 TABLET BY MOUTH EVERYDAY AT BEDTIME 90 tablet 0  .  levothyroxine (SYNTHROID, LEVOTHROID) 75 MCG tablet TAKE 1 TABLET BY MOUTH EVERY DAY 90 tablet 1  . pantoprazole (PROTONIX) 40 MG tablet TAKE 1 TABLET (40 MG TOTAL) BY MOUTH DAILY. TAKE 30-60 MIN BEFORE FIRST MEAL OF THE DAY 90 tablet 1  . pilocarpine (PILOCAR) 1 % ophthalmic solution Place 1 drop into the left eye 3 (three) times daily.    . predniSONE (DELTASONE) 5 MG tablet TAKE 3 TABLETS (15 MG  TOTAL) BY MOUTH DAILY WITH BREAKFAST. 90 tablet 1  . TOPROL XL 50 MG 24 hr tablet TAKE 1 TABLET BY MOUTH EVERY DAY WITH OR IMMEDIATELY FOLLOWING A MEAL 90 tablet 0  . Travoprost, BAK Free, (TRAVATAN) 0.004 % SOLN ophthalmic solution Place 1 drop into both eyes at bedtime.    . vitamin B-12 (CYANOCOBALAMIN) 500 MCG tablet Take 500 mcg by mouth daily.    Marland Kitchen warfarin (COUMADIN) 5 MG tablet TAKE AS DIRECTED BY COUMADIN CLINIC 80 tablet 1   No current facility-administered medications on file prior to visit.    Review of Systems Constitutional: Negative for other unusual diaphoresis, sweats, appetite or weight changes HENT: Negative for other worsening hearing loss, ear pain, facial swelling, mouth sores or neck stiffness.   Eyes: Negative for other worsening pain, redness or other visual disturbance.  Respiratory: Negative for other stridor or swelling Cardiovascular: Negative for other palpitations or other chest pain  Gastrointestinal: Negative for worsening diarrhea or loose stools, blood in stool, distention or other pain Genitourinary: Negative for hematuria, flank pain or other change in urine volume.  Musculoskeletal: Negative for myalgias or other joint swelling.  Skin: Negative for other color change, or other wound or worsening drainage.  Neurological: Negative for other syncope or numbness. Hematological: Negative for other adenopathy or swelling Psychiatric/Behavioral: Negative for hallucinations, other worsening agitation, SI, self-injury, or new decreased concentration All other system neg per pt    Objective:   Physical Exam BP 120/78   Pulse 63   Temp 97.9 F (36.6 C) (Oral)   Ht 5\' 6"  (1.676 m)   SpO2 94%   BMI 32.77 kg/m  VS noted,  Constitutional: Pt is oriented to person, place, and time. Appears well-developed and well-nourished, in no significant distress and comfortable Head: Normocephalic and atraumatic  Eyes: Conjunctivae and EOM are normal. Pupils are equal,  round, and reactive to light Right Ear: External ear normal without discharge Left Ear: External ear normal without discharge Nose: Nose without discharge or deformity Mouth/Throat: Oropharynx is without other ulcerations and moist  Neck: Normal range of motion. Neck supple. No JVD present. No tracheal deviation present or significant neck LA or mass Cardiovascular: Normal rate, regular rhythm, normal heart sounds and intact distal pulses.   Pulmonary/Chest: WOB normal and breath sounds without rales or wheezing  Abdominal: Soft. Bowel sounds are normal. NT. No HSM  Musculoskeletal: Normal range of motion. Exhibits 2-3+ edema to above the knees Lymphadenopathy: Has no other cervical adenopathy.  Neurological: Pt is alert and oriented to person, place, and time. Pt has normal reflexes. No cranial nerve deficit. Motor grossly intact, Gait intact Skin: Skin is warm and dry. No rash noted or new ulcerations Psychiatric:  Has nervous mood and affect. Behavior is normal without agitation No other exam findings Lab Results  Component Value Date   WBC 6.4 03/30/2019   HGB 14.6 03/30/2019   HCT 43.8 03/30/2019   PLT 137.0 (L) 03/30/2019   GLUCOSE 86 03/30/2019   CHOL 120 03/30/2019   TRIG 83.0  03/30/2019   HDL 25.10 (L) 03/30/2019   LDLDIRECT 149.7 07/17/2012   LDLCALC 78 03/30/2019   ALT 24 03/30/2019   AST 34 03/30/2019   NA 134 (L) 03/30/2019   K 3.9 03/30/2019   CL 99 03/30/2019   CREATININE 1.31 (H) 03/30/2019   BUN 18 03/30/2019   CO2 22 03/30/2019   TSH 6.03 (H) 03/30/2019   INR 2.5 02/11/2019   HGBA1C 6.1 03/30/2019      Assessment & Plan:

## 2019-03-31 ENCOUNTER — Encounter: Payer: Self-pay | Admitting: Internal Medicine

## 2019-03-31 ENCOUNTER — Telehealth: Payer: Self-pay

## 2019-03-31 ENCOUNTER — Telehealth: Payer: Self-pay | Admitting: Pharmacist

## 2019-03-31 LAB — HEPATIC FUNCTION PANEL
ALT: 24 U/L (ref 0–35)
AST: 34 U/L (ref 0–37)
Albumin: 3.4 g/dL — ABNORMAL LOW (ref 3.5–5.2)
Alkaline Phosphatase: 89 U/L (ref 39–117)
Bilirubin, Direct: 1.1 mg/dL — ABNORMAL HIGH (ref 0.0–0.3)
Total Bilirubin: 2.3 mg/dL — ABNORMAL HIGH (ref 0.2–1.2)
Total Protein: 6.7 g/dL (ref 6.0–8.3)

## 2019-03-31 LAB — VITAMIN D 25 HYDROXY (VIT D DEFICIENCY, FRACTURES): VITD: 32.9 ng/mL (ref 30.00–100.00)

## 2019-03-31 LAB — LIPID PANEL
Cholesterol: 120 mg/dL (ref 0–200)
HDL: 25.1 mg/dL — ABNORMAL LOW (ref >39.00)
LDL Cholesterol: 78 mg/dL (ref 0–99)
NonHDL: 95.01
Total CHOL/HDL Ratio: 5
Triglycerides: 83 mg/dL (ref 0.0–149.0)
VLDL: 16.6 mg/dL (ref 0.0–40.0)

## 2019-03-31 LAB — BASIC METABOLIC PANEL
BUN: 18 mg/dL (ref 6–23)
CO2: 22 mEq/L (ref 19–32)
Calcium: 9.4 mg/dL (ref 8.4–10.5)
Chloride: 99 mEq/L (ref 96–112)
Creatinine, Ser: 1.31 mg/dL — ABNORMAL HIGH (ref 0.40–1.20)
GFR: 47.07 mL/min — ABNORMAL LOW (ref >60.00)
Glucose, Bld: 86 mg/dL (ref 70–99)
Potassium: 3.9 mEq/L (ref 3.5–5.1)
Sodium: 134 mEq/L — ABNORMAL LOW (ref 135–145)

## 2019-03-31 LAB — IBC PANEL
Iron: 67 ug/dL (ref 42–145)
Saturation Ratios: 19.3 % — ABNORMAL LOW (ref 20.0–50.0)
Transferrin: 248 mg/dL (ref 212.0–360.0)

## 2019-03-31 LAB — VITAMIN B12: Vitamin B-12: 929 pg/mL — ABNORMAL HIGH (ref 211–911)

## 2019-03-31 LAB — TSH: TSH: 6.03 u[IU]/mL — ABNORMAL HIGH (ref 0.35–4.50)

## 2019-03-31 LAB — HEMOGLOBIN A1C: Hgb A1c MFr Bld: 6.1 % (ref 4.6–6.5)

## 2019-03-31 NOTE — Assessment & Plan Note (Signed)
stable overall by history and exam, recent data reviewed with pt, and pt to continue medical treatment as before,  to f/u any worsening symptoms or concerns  

## 2019-03-31 NOTE — Telephone Encounter (Signed)
Pt would like to know if it is ok if she continues to drink soda, specifically Coke, she wonders if that is a part of her retaining fluid? Please advise.    Copied from [place] [id number]. Topic: General - Other >> Mar 31, 2019  1:46 PM Rainey Pines A wrote: Patient would like to speak with Dr .Jenny Reichmann nurse in regards to a diet decision concenring the swelling she was seen for yesterday and would like a callback.

## 2019-03-31 NOTE — Telephone Encounter (Signed)
No, Coke by itself is not related to fluid retention, but please make sure to only drink fluids if you are thirsty, as too much fluid intake per day overall can lead to swelling

## 2019-03-31 NOTE — Telephone Encounter (Signed)
Patient called stating she took a whole tablet of coumadin today instead of a half. Advised tomorrow she should take 1/2 tablet instead of the whole tablet she is supposed to take.

## 2019-03-31 NOTE — Assessment & Plan Note (Signed)
Likely related to worsening ILD and fibrosing mediastinitis, very uncomfortable today - for increase lasix to 40 qam, then 40 qpm prn persistent swelling, f/ui 3 wks

## 2019-03-31 NOTE — Assessment & Plan Note (Signed)

## 2019-04-01 NOTE — Telephone Encounter (Signed)
Pt has been informed and expressed understanding.  

## 2019-04-03 ENCOUNTER — Other Ambulatory Visit: Payer: Self-pay | Admitting: Internal Medicine

## 2019-04-07 ENCOUNTER — Other Ambulatory Visit: Payer: Self-pay | Admitting: Internal Medicine

## 2019-04-08 ENCOUNTER — Telehealth: Payer: Self-pay

## 2019-04-08 MED ORDER — FUROSEMIDE 80 MG PO TABS: 80.0000 mg | ORAL_TABLET | Freq: Two times a day (BID) | ORAL | 5 refills | Status: AC

## 2019-04-08 NOTE — Telephone Encounter (Signed)
Pt has been informed and expressed understanding. Will check schedule and call back to schedule 1 week OV.

## 2019-04-08 NOTE — Telephone Encounter (Signed)
Copied from [place] [id number]. Topic: General - Inquiry >> Apr 08, 2019 12:15 PM Richardo Priest, NT wrote: Reason for CRM: Patient calling in stating she is still retaining a lot a fluid, despite her lasix medication being increased. Please advise and call back is [phone].

## 2019-04-08 NOTE — Addendum Note (Signed)
Addended by: Biagio Borg on: 04/08/2019 01:30 PM   Modules accepted: Orders

## 2019-04-08 NOTE — Telephone Encounter (Signed)
Ok for incresaed lasix to 80 mg twice per day, and check daily wt in the AM    Needs ROV in 1 week with wts and will need f/u labs

## 2019-04-12 DIAGNOSIS — R0602 Shortness of breath: Secondary | ICD-10-CM | POA: Diagnosis not present

## 2019-04-12 DIAGNOSIS — R918 Other nonspecific abnormal finding of lung field: Secondary | ICD-10-CM | POA: Diagnosis not present

## 2019-04-12 DIAGNOSIS — J9611 Chronic respiratory failure with hypoxia: Secondary | ICD-10-CM | POA: Diagnosis not present

## 2019-04-12 DIAGNOSIS — R768 Other specified abnormal immunological findings in serum: Secondary | ICD-10-CM | POA: Diagnosis not present

## 2019-04-12 DIAGNOSIS — I480 Paroxysmal atrial fibrillation: Secondary | ICD-10-CM | POA: Diagnosis not present

## 2019-04-12 DIAGNOSIS — Z7901 Long term (current) use of anticoagulants: Secondary | ICD-10-CM | POA: Diagnosis not present

## 2019-04-12 DIAGNOSIS — R7982 Elevated C-reactive protein (CRP): Secondary | ICD-10-CM | POA: Diagnosis not present

## 2019-04-12 DIAGNOSIS — I50810 Right heart failure, unspecified: Secondary | ICD-10-CM | POA: Diagnosis not present

## 2019-04-12 DIAGNOSIS — R05 Cough: Secondary | ICD-10-CM | POA: Diagnosis not present

## 2019-04-12 DIAGNOSIS — Z87891 Personal history of nicotine dependence: Secondary | ICD-10-CM | POA: Diagnosis not present

## 2019-04-12 DIAGNOSIS — J849 Interstitial pulmonary disease, unspecified: Secondary | ICD-10-CM | POA: Diagnosis not present

## 2019-04-14 ENCOUNTER — Encounter: Payer: Self-pay | Admitting: Pharmacist

## 2019-04-14 ENCOUNTER — Other Ambulatory Visit: Payer: Self-pay | Admitting: Internal Medicine

## 2019-04-14 ENCOUNTER — Telehealth: Payer: Self-pay | Admitting: Pharmacist

## 2019-04-14 ENCOUNTER — Ambulatory Visit: Payer: Self-pay | Admitting: *Deleted

## 2019-04-14 DIAGNOSIS — I4891 Unspecified atrial fibrillation: Secondary | ICD-10-CM

## 2019-04-14 NOTE — Telephone Encounter (Signed)
Pt called clinic to make Coumadin appt, however states that she has been having trouble walking and cannot get out of her car with assistance. Previously discussed changing to a DOAC with pt as this would be much easier in the long run to avoid frequent office visits for INR checks. Pt states she would like Dr Harrington Challenger' input before changing therapy. She would qualify for Eliquis 5mg  BID for afib indication with weight > 60kg and SCr < 1.5. Copay is $47/month which pt reports is affordable.  Will route to Dr Harrington Challenger for input regarding changing from warfarin to Eliquis.

## 2019-04-14 NOTE — Telephone Encounter (Signed)
1. COVID-19 Pre-Screening Questions:  . In the past 7 to 10 days have you had a cough,  shortness of breath, headache, congestion, fever (100 or greater) body aches, chills, sore throat, or sudden loss of taste or sense of smell?  No . Have you been around anyone with known Covid 19.  No . Have you been around anyone who is awaiting Covid 19 test results in the past 7 to 10 days?  No . Have you been around anyone who has been exposed to Covid 19, or has mentioned symptoms of Covid 19 within the past 7 to 10 days?  No    2. Pt advised of visitor restrictions (no visitors allowed except if needed to conduct the visit). Also advised to arrive at appointment time and wear a mask.     This encounter was created in error - please disregard.

## 2019-04-14 NOTE — Telephone Encounter (Signed)
[place] for [employer]

## 2019-04-14 NOTE — Telephone Encounter (Signed)
Patient calls with bilateral lower extremity and ankle edema. Seen by Dr.John 2 weeks ago for this condition. Saw [facility] physician earlier this week for this. She was told to follow up with PCP again. She is on 02@4L  all the time. Takes lasix 80 mg twice daily, has good urine output she reports. Stays low with added salt products. Denies difficulty breathing at this time. States the swelling is about the same as her last OV on 6/9.Reviewed Care advice patient stated understanding.  Attempted warm transfer for appointment, unable to get a person. Routing to PCP for contact with the patient.  Reason for Disposition . [1] MODERATE leg swelling (e.g., swelling extends up to knees) AND [2] new onset or worsening  Answer Assessment - Initial Assessment Questions 1. ONSET: "When did the swelling start?" (e.g., minutes, hours, days)     *No Answer* 2. LOCATION: "What part of the leg is swollen?"  "Are both legs swollen or just one leg?"     Both legs at the knee 3. SEVERITY: "How bad is the swelling?" (e.g., localized; mild, moderate, severe)  - Localized - small area of swelling localized to one leg  - MILD pedal edema - swelling limited to foot and ankle, pitting edema < 1/4 inch (6 mm) deep, rest and elevation eliminate most or all swelling  - MODERATE edema - swelling of lower leg to knee, pitting edema > 1/4 inch (6 mm) deep, rest and elevation only partially reduce swelling  - SEVERE edema - swelling extends above knee, facial or hand swelling present      Goes just above the knee 4. REDNESS: "Does the swelling look red or infected?"     Does not look red or infected 5. PAIN: "Is the swelling painful to touch?" If so, ask: "How painful is it?"   (Scale 1-10; mild, moderate or severe)     No, not painful to touch 6. FEVER: "Do you have a fever?" If so, ask: "What is it, how was it measured, and when did it start?"      none 7. CAUSE: "What do you think is causing the leg swelling?"     Unsure,  seeing MD at [facility]. Last OV was 2 days ago.  8. MEDICAL HISTORY: "Do you have a history of heart failure, kidney disease, liver failure, or cancer?"     Reports heart failure in 2002 9. RECURRENT SYMPTOM: "Have you had leg swelling before?" If so, ask: "When was the last time?" "What happened that time?"     Yes. 10. OTHER SYMPTOMS: "Do you have any other symptoms?" (e.g., chest pain, difficulty breathing)      No CP, no changes in her normal SOB, is on 02@4L  all the time. 11. PREGNANCY: "Is there any chance you are pregnant?" "When was your last menstrual period?"       na  Protocols used: LEG SWELLING AND EDEMA-A-AH

## 2019-04-15 NOTE — Telephone Encounter (Signed)
Left pt vm to call back to move 7/1 appt up sooner with Dr. Jenny Reichmann.

## 2019-04-15 NOTE — Telephone Encounter (Signed)
Yes--  I would switch to Eliquis 5mg  bid  F/U BMET and CBC in September

## 2019-04-16 MED ORDER — APIXABAN 5 MG PO TABS: 5.0000 mg | ORAL_TABLET | Freq: Two times a day (BID) | ORAL | 6 refills | Status: AC

## 2019-04-16 NOTE — Telephone Encounter (Signed)
Spoke with pt and advised her to stop taking warfarin on Sunday, no anticoagulation on Monday, and start taking Eliquis 5mg  twice daily on Tuesday AM (she cannot get to the pharmacy before Monday).   I have activated a copay card so that her first month will be free, follow up rx will be $47/month - this is ok with pt.  Advised pt we need to recheck BMET and CBC in 3 months. She will need to coordinate a ride in to clinic for lab check. I have entered the orders and pt will call back with a date for labs - advised pt they should be drawn by the end of September.

## 2019-04-18 DIAGNOSIS — J9611 Chronic respiratory failure with hypoxia: Secondary | ICD-10-CM | POA: Diagnosis not present

## 2019-04-20 ENCOUNTER — Ambulatory Visit: Payer: Medicare Other | Admitting: Internal Medicine

## 2019-04-20 ENCOUNTER — Telehealth: Payer: Self-pay | Admitting: Internal Medicine

## 2019-04-20 NOTE — Telephone Encounter (Signed)
General/Other - Appointment  The patient was there about 10 minutes before her appointment and stayed until after they closed. No one ever came out to get her for the appointment. Please call the patient back tomorrow.

## 2019-04-21 ENCOUNTER — Ambulatory Visit: Payer: Medicare Other | Admitting: Internal Medicine

## 2019-04-21 DIAGNOSIS — J9611 Chronic respiratory failure with hypoxia: Secondary | ICD-10-CM | POA: Diagnosis not present

## 2019-04-21 NOTE — Telephone Encounter (Signed)
LVM for patient to call and see what happen with her appointment yesterday, & Also to help her reschedule.

## 2019-04-22 ENCOUNTER — Other Ambulatory Visit: Payer: Self-pay | Admitting: Internal Medicine

## 2019-04-22 ENCOUNTER — Encounter: Payer: Self-pay | Admitting: Internal Medicine

## 2019-04-22 ENCOUNTER — Other Ambulatory Visit (INDEPENDENT_AMBULATORY_CARE_PROVIDER_SITE_OTHER): Payer: Medicare Other

## 2019-04-22 ENCOUNTER — Telehealth: Payer: Self-pay

## 2019-04-22 ENCOUNTER — Ambulatory Visit (INDEPENDENT_AMBULATORY_CARE_PROVIDER_SITE_OTHER): Payer: Medicare Other | Admitting: Internal Medicine

## 2019-04-22 ENCOUNTER — Other Ambulatory Visit: Payer: Self-pay

## 2019-04-22 VITALS — BP 122/84 | HR 64 | Temp 97.9°F | Ht 66.0 in

## 2019-04-22 DIAGNOSIS — K219 Gastro-esophageal reflux disease without esophagitis: Secondary | ICD-10-CM

## 2019-04-22 DIAGNOSIS — R609 Edema, unspecified: Secondary | ICD-10-CM | POA: Diagnosis not present

## 2019-04-22 DIAGNOSIS — Z7901 Long term (current) use of anticoagulants: Secondary | ICD-10-CM

## 2019-04-22 DIAGNOSIS — T45511A Poisoning by anticoagulants, accidental (unintentional), initial encounter: Secondary | ICD-10-CM | POA: Insufficient documentation

## 2019-04-22 DIAGNOSIS — J9851 Mediastinitis: Secondary | ICD-10-CM

## 2019-04-22 LAB — BASIC METABOLIC PANEL
BUN: 25 mg/dL — ABNORMAL HIGH (ref 6–23)
CO2: 28 mEq/L (ref 19–32)
Calcium: 8.8 mg/dL (ref 8.4–10.5)
Chloride: 93 mEq/L — ABNORMAL LOW (ref 96–112)
Creatinine, Ser: 1.27 mg/dL — ABNORMAL HIGH (ref 0.40–1.20)
GFR: 48.78 mL/min — ABNORMAL LOW (ref >60.00)
Glucose, Bld: 86 mg/dL (ref 70–99)
Potassium: 3.3 mEq/L — ABNORMAL LOW (ref 3.5–5.1)
Sodium: 132 mEq/L — ABNORMAL LOW (ref 135–145)

## 2019-04-22 LAB — PROTIME-INR
INR: 12.2 ratio (ref 0.8–1.0)
Prothrombin Time: 136.4 s (ref 9.6–13.1)

## 2019-04-22 MED ORDER — FAMOTIDINE 20 MG PO TABS: ORAL_TABLET | ORAL | 3 refills | Status: AC

## 2019-04-22 MED ORDER — VITAMIN K 100 MCG PO TABS: 2000.0000 ug | ORAL_TABLET | Freq: Every day | ORAL | 0 refills | Status: AC

## 2019-04-22 MED ORDER — PANTOPRAZOLE SODIUM 40 MG PO TBEC: 40.0000 mg | DELAYED_RELEASE_TABLET | Freq: Every day | ORAL | 3 refills | Status: AC

## 2019-04-22 NOTE — Progress Notes (Signed)
Subjective:    Patient ID: Monica Mora, female    DOB: 1935/04/11, 84 y.o.   MRN: [id number]  HPI    Here to f/u recent increased lasix 40 bid with much improved LE edema and wt loss; Pt denies chest pain, increased sob or doe, wheezing, orthopnea, PND, increased LE swelling, palpitations, dizziness or syncope.   Wt Readings from Last 3 Encounters:  01/07/19 203 lb (92.1 kg)  08/05/18 216 lb 9.6 oz (98.2 kg)  07/31/18 217 lb (98.4 kg)  Has been following strict low sodium diet, drinking fluids < 1.5 L per day and better diet overall.  Has been taking the lasix 40 bid daily To start eliquis tomorrow after stopping coumadin last dosing today in addition to lovenox last dosing as well. Just seen at [facility] yesterday with mild elevated esr, crp, + ANA and BNP 2868.  Pt request f/u INR today.  Hasl also had mild worsening reflux after stopping her PPI and pepcid about 1 mo ago for some reason, but no abd pain, dysphagia, n/v, bowel change or blood.   Past Medical History:  Diagnosis Date  . Abnormal chest x-ray   . Atrial fibrillation ([place])   . CHF (congestive heart failure) ([place])   . Constrictive pericarditis    s/p pericardial window in 2002 in [place]  . Glaucoma   . H/O: hysterectomy 1992  . Heart murmur   . Hyperlipidemia   . Nontoxic multinodular goiter   . Other voice and resonance disorders   . TB (tuberculosis) 2002   positive skin test, negative chest x-ray  . Unspecified essential hypertension   . Unspecified hypothyroidism    Past Surgical History:  Procedure Laterality Date  . ABDOMINAL HYSTERECTOMY  1992  . PERICARDIAL WINDOW  2002  . Radioactive Iodine for hyperthyroidism/Multinodular goiter     s/p  . VIDEO BRONCHOSCOPY Bilateral 01/21/2018   Procedure: VIDEO BRONCHOSCOPY WITH FLUORO;  Surgeon: Tanda Rockers, MD;  Location: [facility];  Service: Cardiopulmonary;  Laterality: Bilateral;  . VIDEO BRONCHOSCOPY WITH ENDOBRONCHIAL NAVIGATION Right 02/11/2018   Procedure: VIDEO BRONCHOSCOPY WITH ENDOBRONCHIAL NAVIGATION;  Surgeon: Collene Gobble, MD;  Location: [place];  Service: Thoracic;  Laterality: Right;  Marland Kitchen VIDEO BRONCHOSCOPY WITH ENDOBRONCHIAL ULTRASOUND Right 02/11/2018   Procedure: VIDEO BRONCHOSCOPY WITH ENDOBRONCHIAL ULTRASOUND;  Surgeon: Collene Gobble, MD;  Location: [place];  Service: Thoracic;  Laterality: Right;    reports that she quit smoking about 40 years ago. Her smoking use included cigarettes. She has a 10.50 pack-year smoking history. She has never used smokeless tobacco. She reports that she does not drink alcohol or use drugs. family history includes Arthritis in her father and mother; Asthma in her brother; Cancer in her sister; Cervical cancer in her sister. No Known Allergies Current Outpatient Medications on File Prior to Visit  Medication Sig Dispense Refill  . albuterol (PROAIR HFA) 108 (90 Base) MCG/ACT inhaler Inhale 2 puffs into the lungs every 6 (six) hours as needed for wheezing or shortness of breath. 1 Inhaler 3  . apixaban (ELIQUIS) 5 MG TABS tablet Take 1 tablet (5 mg total) by mouth 2 (two) times daily. 60 tablet 6  . brimonidine (ALPHAGAN) 0.2 % ophthalmic solution Place 1 drop into the left eye 3 (three) times daily.    . Cholecalciferol (VITAMIN D) 1000 UNITS capsule Take 1,000 Units by mouth daily.      . dorzolamide-timolol (COSOPT) 22.3-6.8 MG/ML ophthalmic solution Place 1 drop into both eyes 2 (  two) times daily.     . furosemide (LASIX) 80 MG tablet Take 1 tablet (80 mg total) by mouth 2 (two) times daily. 60 tablet 5  . levothyroxine (SYNTHROID, LEVOTHROID) 75 MCG tablet TAKE 1 TABLET BY MOUTH EVERY DAY 90 tablet 1  . pilocarpine (PILOCAR) 1 % ophthalmic solution Place 1 drop into the left eye 3 (three) times daily.    . predniSONE (DELTASONE) 5 MG tablet TAKE 3 TABLETS (15 MG TOTAL) BY MOUTH DAILY WITH BREAKFAST. 90 tablet 1  . TOPROL XL 50 MG 24 hr tablet TAKE 1 TABLET BY MOUTH EVERY DAY WITH OR IMMEDIATELY  FOLLOWING A MEAL 90 tablet 0  . Travoprost, BAK Free, (TRAVATAN) 0.004 % SOLN ophthalmic solution Place 1 drop into both eyes at bedtime.    . vitamin B-12 (CYANOCOBALAMIN) 500 MCG tablet Take 500 mcg by mouth daily.     No current facility-administered medications on file prior to visit.    Review of Systems   Constitutional: Negative for other unusual diaphoresis or sweats HENT: Negative for ear discharge or swelling Eyes: Negative for other worsening visual disturbances Respiratory: Negative for stridor or other swelling  Gastrointestinal: Negative for worsening distension or other blood Genitourinary: Negative for retention or other urinary change Musculoskeletal: Negative for other MSK pain or swelling Skin: Negative for color change or other new lesions Neurological: Negative for worsening tremors and other numbness  Psychiatric/Behavioral: Negative for worsening agitation or other fatigue All other system neg per pt    Objective:   Physical Exam BP 122/84   Pulse 64   Temp 97.9 F (36.6 C) (Oral)   Ht '5\' 6"'$  (1.676 m)   SpO2 93%   BMI 32.77 kg/m  VS noted,  Constitutional: Pt appears in NAD HENT: Head: NCAT.  Right Ear: External ear normal.  Left Ear: External ear normal.  Eyes: . Pupils are equal, round, and reactive to light. Conjunctivae and EOM are normal Nose: without d/c or deformity Neck: Neck supple. Gross normal ROM Cardiovascular: Normal rate and regular rhythm.   Pulmonary/Chest: Effort normal and breath sounds without rales or wheezing.  Abd:  Soft, NT, ND, + BS, no organomegaly Neurological: Pt is alert. At baseline orientation, motor grossly intact Skin: Skin is warm. No rashes, other new lesions, trace to 1+ bilat LE edema (improved) Psychiatric: Pt behavior is normal without agitation  No other exam findings  Lab Results  Component Value Date   WBC 6.4 03/30/2019   HGB 14.6 03/30/2019   HCT 43.8 03/30/2019   PLT 137.0 (L) 03/30/2019   GLUCOSE  86 03/30/2019   CHOL 120 03/30/2019   TRIG 83.0 03/30/2019   HDL 25.10 (L) 03/30/2019   LDLDIRECT 149.7 07/17/2012   LDLCALC 78 03/30/2019   ALT 24 03/30/2019   AST 34 03/30/2019   NA 134 (L) 03/30/2019   K 3.9 03/30/2019   CL 99 03/30/2019   CREATININE 1.31 (H) 03/30/2019   BUN 18 03/30/2019   CO2 22 03/30/2019   TSH 6.03 (H) 03/30/2019   INR 2.5 02/11/2019   HGBA1C 6.1 03/30/2019       Assessment & Plan:

## 2019-04-22 NOTE — Assessment & Plan Note (Signed)
Con f/u with [facility]

## 2019-04-22 NOTE — Assessment & Plan Note (Signed)
To restart PPI and pepcid asd,  to f/u any worsening symptoms or concerns

## 2019-04-22 NOTE — Assessment & Plan Note (Addendum)
For f/u INR today  to f/u any worsening symptoms or concerns, plan to finish lovenox and warfarin today as planned, start eliquis tomorrow

## 2019-04-22 NOTE — Assessment & Plan Note (Addendum)
Improved, to cont lasix 40 bid, f/u bmp today  Note:  Total time for pt hx, exam, review of record with pt in the room, determination of diagnoses and plan for further eval and tx is > 40 min, with over 50% spent in coordination and counseling of patient including the differential dx, tx, further evaluation and other management of peripheral edema, fibrosing mediastinitis, GERD and chronic anticoagulation

## 2019-04-22 NOTE — Patient Instructions (Signed)
Please continue all other medications as before, and refills have been done if requested.  Please have the pharmacy call with any other refills you may need.  Please continue your efforts at being more active, low cholesterol diet, and weight control.  Please keep your appointments with your specialists as you may have planned  Please go to the LAB in the Basement (turn left off the elevator) for the tests to be done today  You will be contacted by phone if any changes need to be made immediately.  Otherwise, you will receive a letter about your results with an explanation, but please check with MyChart first.  Please remember to sign up for MyChart if you have not done so, as this will be important to you in the future with finding out test results, communicating by private email, and scheduling acute appointments online when needed.

## 2019-04-22 NOTE — Telephone Encounter (Signed)
CRITICAL VALUE STICKER  CRITICAL VALUE: PT: 136     INR: 12.2   RESPONSE: pt notified to stop eliquis over the weekend and we will call her Monday with any other instructions. Pt stated she was on warfarin and today was her first day of taking the eliquis but has been eating more vegetables but will also cut those out this weekend.

## 2019-04-22 NOTE — Telephone Encounter (Signed)
I was asked to address a critical lab this afternoon.  The patient was seen earlier today but there are no notes in her chart about the visit today.  We spoke to her and it sounds like she has been receiving Coumadin elsewhere and the decision today was made to transfer her from Coumadin to Eliquis.  Her INR returns at 12.2.  She told us she is not having any concerns with bleeding or bruising.  We asked her not to start the Eliquis yet.  We asked her to take 2 mg of vitamin K today.  We asked her to come back next Monday to have her INR rechecked before she starts taking Eliquis.  We informed her that if she has any bleeding or bruising or new or unusual symptoms she is to go directly to the emergency department.

## 2019-04-26 ENCOUNTER — Telehealth: Payer: Self-pay

## 2019-04-26 NOTE — Telephone Encounter (Signed)
Clio for [employer] to check with pt in car;  This is the pt probably last INR as she was high, but has been changed to eliquis and will not need cindy and coumadin clinic for long term

## 2019-04-26 NOTE — Telephone Encounter (Signed)
Patient would like to know if she makes arraignments to have labs draw tomorrow will someone be able to assist her from her car to the lab, please advise

## 2019-04-26 NOTE — Telephone Encounter (Signed)
Pt has been informed. Please review previous telephone encounter.

## 2019-04-26 NOTE — Telephone Encounter (Signed)
I have spoken to pt and asked her to come in today to have additional lab work done. She stated that she could come early this afternoon.

## 2019-04-26 NOTE — Telephone Encounter (Signed)
-----   Message from Biagio Borg, MD sent at 04/22/2019  7:18 PM EDT ----- Left message on MyChart, pt to cont same tx except  The test results show that your current treatment is OK, except the INR is very high.  You have already been contacted about this, and asked to HOLD on taking the eliquis start on July 3.  Please plan to return on Monday July 6 for a repeat test to determine if safe to start the Eliquis at that time.  I will also have the office call you.  Shirron to please inform pt, I will do order

## 2019-04-26 NOTE — Telephone Encounter (Signed)
Patient called into our office today after arriving in the parking lot--stated she would need someone assist her to wheelchair and take her down to lab for INR check---I did not have any available assistants that were CNA's or otherwise trained to assist with transporting patient from car to wheelchair safely--would a better option for INR checks be for the patient to see cindy boyd on coumadin clinic days and arrange for cindy to check INR from patient's car----routing to dr Jenny Reichmann, please advise if this would be ok, I will call patient back to see if she would be ok with checking INR's in the future with cindy/parking lot visits--thanks

## 2019-04-27 ENCOUNTER — Ambulatory Visit: Payer: Medicare Other

## 2019-04-27 ENCOUNTER — Telehealth: Payer: Self-pay | Admitting: General Practice

## 2019-04-27 NOTE — Telephone Encounter (Signed)
Patient has made 1:30 appt with cindy boyd today---(appt placed on nurse schedule)--patient given back line number to call in when she arrives, patient covid screened---cindy to check one time INR value at patient's car in parking lot---can talk with Iyad Deroo,RN if any further questions

## 2019-04-27 NOTE — Telephone Encounter (Signed)
Villa Herb, RN checked patient's INR today and it was 1.7.  Patient was given the OK to start Eliquis today.  Dr. Jenny Reichmann has been notified.  Patient verbalized understanding.

## 2019-05-03 ENCOUNTER — Other Ambulatory Visit: Payer: Self-pay | Admitting: Internal Medicine

## 2019-05-05 ENCOUNTER — Telehealth: Payer: Self-pay

## 2019-05-05 NOTE — Telephone Encounter (Signed)
Ok to wait to tomorrow for the fluid pill  Since Eliquis does not cause constipation, I would look for another cause, such as low activity level, diet change, other medications  Ok to try OTC dulcolox prn

## 2019-05-05 NOTE — Telephone Encounter (Signed)
Copied from [place] [id number]. Topic: General - Inquiry >> May 05, 2019  2:16 PM Richardo Priest, [place] wrote: Reason for CRM: Patient calling in stating she may have not taken her medication today and she is wondering if she should take the medicine or wait till next day (fluid pill). Patient is also stating eliquis has caused her bowels to stop moving. Call back is [phone].

## 2019-05-06 NOTE — Telephone Encounter (Signed)
Called pt, LVM.   

## 2019-05-10 ENCOUNTER — Telehealth: Payer: Self-pay | Admitting: Internal Medicine

## 2019-05-10 NOTE — Telephone Encounter (Signed)
Noted  

## 2019-05-10 NOTE — Telephone Encounter (Signed)
Fortunately the eliquis does not cause feeling of coldness or constipation, so I would not change at this time.    Thanks

## 2019-05-10 NOTE — Telephone Encounter (Signed)
Patient was put on Eliquist and is now experiencing constipation and feeling cold all the time. She would like to know if there is a different medication she can take. Would like a call back please. Her pharmacy is   [id number] [address], [place] [phone] (Phone) [phone] (Fax)

## 2019-05-10 NOTE — Telephone Encounter (Signed)
Patient called back and I have informed.

## 2019-05-10 NOTE — Telephone Encounter (Signed)
Called pt, LVM.   

## 2019-05-11 ENCOUNTER — Telehealth: Payer: Self-pay | Admitting: Internal Medicine

## 2019-05-11 NOTE — Telephone Encounter (Signed)
There is an off label indication to use the 110mg  BID dose in patients at higher risk of bleeding. In patients > or equal to 82 years old dabigatran 150mg  BID was associated with increased risk of major extracrainal bleeding. The risk of GI bleeding was increased for women > or equal to 75 or men >85. So there is some data to support the use of the 110 dose in patient greater than or equal to 80, but it is still technically off label.  The tradeoff however is that the efficacy (protection from stroke) of the 150 is better than the 110 dose. Since it does not appear the patient has a history of bleeding, I would opt for the 150mg  BID dosing.  I will stress to the patient that this change is unlikely to make any change in her cold status and will increase her risk of bleeding compared to Eliquis.

## 2019-05-11 NOTE — Telephone Encounter (Signed)
Spoke with patient- advised that both Dr. Harrington Challenger and myself would not recommend changing, but if she really wants she can try Pradaxa 150mg  twice a day. Patient would like to do some reasearch of her own and call me back tomorrow or the next day.  Also asking about a laxative. Has tried miralax and bisacodly without effect. Mag citrate effective but would like something else.Recommended she try senna/docusate. States it might take a day or two to start working. Could take daily if she needed.

## 2019-05-11 NOTE — Telephone Encounter (Signed)
Spoke with patient. Informed her that Eliquis does not cause the feeling of cold nor constipation.  Advised to try miralax, or senna/docusate to help with bowl movements. Patient states she tried miralax without results. States she was able to get a good movement with mag citrate.  Patient states that shes heard other people say they were cold with Eliquis and she wants to try another blood thinner. Advised that it could be from her thyroid or her afib, but patient insistent that she try another blood thinner. I reiterated that Eliquis was the safest blood thinner as far as bleeding was concerned. Will see if Dr. Harrington Challenger is ok with switching to xarelto or pradaxa

## 2019-05-11 NOTE — Telephone Encounter (Signed)
Pt c/o medication issue:  1. Name of Medication: apixaban (ELIQUIS) 5 MG TABS tablet  2. How are you currently taking this medication (dosage and times per day)?   3. Are you having a reaction (difficulty breathing--STAT)? no  4. What is your medication issue? Patient states that since she started medication, she is cold, and is having trouble with bowel movements.  She states she was regular before never had an issue with bowel movements. She is always cold no matter how hot it is.

## 2019-05-11 NOTE — Telephone Encounter (Signed)
I am not convinced Eliquis to blame for symtpoms but if set on switching I would recomm switching to pradaxa 150 bid  Will confirm with anticoag clinic  150 (not 110 dose)    Cr cl is right on border for Xarelto

## 2019-05-12 DIAGNOSIS — H401123 Primary open-angle glaucoma, left eye, severe stage: Secondary | ICD-10-CM | POA: Diagnosis not present

## 2019-05-12 DIAGNOSIS — H25813 Combined forms of age-related cataract, bilateral: Secondary | ICD-10-CM | POA: Diagnosis not present

## 2019-05-12 DIAGNOSIS — H401112 Primary open-angle glaucoma, right eye, moderate stage: Secondary | ICD-10-CM | POA: Diagnosis not present

## 2019-05-14 DIAGNOSIS — H2513 Age-related nuclear cataract, bilateral: Secondary | ICD-10-CM | POA: Diagnosis not present

## 2019-05-14 DIAGNOSIS — H401112 Primary open-angle glaucoma, right eye, moderate stage: Secondary | ICD-10-CM | POA: Diagnosis not present

## 2019-05-14 DIAGNOSIS — H401123 Primary open-angle glaucoma, left eye, severe stage: Secondary | ICD-10-CM | POA: Diagnosis not present

## 2019-05-18 ENCOUNTER — Ambulatory Visit (INDEPENDENT_AMBULATORY_CARE_PROVIDER_SITE_OTHER): Payer: Medicare Other | Admitting: Internal Medicine

## 2019-05-18 ENCOUNTER — Other Ambulatory Visit: Payer: Self-pay

## 2019-05-18 ENCOUNTER — Encounter: Payer: Self-pay | Admitting: Internal Medicine

## 2019-05-18 ENCOUNTER — Other Ambulatory Visit (INDEPENDENT_AMBULATORY_CARE_PROVIDER_SITE_OTHER): Payer: Medicare Other

## 2019-05-18 VITALS — BP 138/84 | HR 65 | Temp 97.9°F | Wt 181.2 lb

## 2019-05-18 DIAGNOSIS — R609 Edema, unspecified: Secondary | ICD-10-CM

## 2019-05-18 DIAGNOSIS — E876 Hypokalemia: Secondary | ICD-10-CM

## 2019-05-18 DIAGNOSIS — I10 Essential (primary) hypertension: Secondary | ICD-10-CM

## 2019-05-18 DIAGNOSIS — J9611 Chronic respiratory failure with hypoxia: Secondary | ICD-10-CM | POA: Diagnosis not present

## 2019-05-18 LAB — BASIC METABOLIC PANEL
BUN: 8 mg/dL (ref 6–23)
CO2: 32 mEq/L (ref 19–32)
Calcium: 9.8 mg/dL (ref 8.4–10.5)
Chloride: 96 mEq/L (ref 96–112)
Creatinine, Ser: 0.88 mg/dL (ref 0.40–1.20)
GFR: 74.48 mL/min (ref >60.00)
Glucose, Bld: 91 mg/dL (ref 70–99)
Potassium: 3.4 mEq/L — ABNORMAL LOW (ref 3.5–5.1)
Sodium: 135 mEq/L (ref 135–145)

## 2019-05-18 MED ORDER — POTASSIUM CHLORIDE ER 10 MEQ PO TBCR: 10.0000 meq | EXTENDED_RELEASE_TABLET | Freq: Every day | ORAL | 3 refills | Status: AC

## 2019-05-18 NOTE — Assessment & Plan Note (Signed)
stable overall by history and exam, recent data reviewed with pt, and pt to continue medical treatment as before,  to f/u any worsening symptoms or concerns  

## 2019-05-18 NOTE — Patient Instructions (Addendum)
If you have concerns about the office policies, please see Monica Mora  You have also been referred to [facility]) to see if they are able to assist with your care  Please take all new medication as prescribed - the potassium pill  Please continue all other medications as before, and refills have been done if requested.  Please have the pharmacy call with any other refills you may need.  Please continue your efforts at being more active, low cholesterol diet  Please keep your appointments with your specialists as you may have planned  Please go to the LAB in the Basement (turn left off the elevator) for the tests to be done today  You will be contacted by phone if any changes need to be made immediately.  Otherwise, you will receive a letter about your results with an explanation, but please check with MyChart first.  Please remember to sign up for MyChart if you have not done so, as this will be important to you in the future with finding out test results, communicating by private email, and scheduling acute appointments online when needed.  Please return in 3 months, or sooner if needed

## 2019-05-18 NOTE — Assessment & Plan Note (Signed)
Improved, and now tolerable symptomatically, will cont same dose lasix and f/u bmp today

## 2019-05-18 NOTE — Assessment & Plan Note (Signed)
To start potassium daily replacement,  to f/u any worsening symptoms or concerns

## 2019-05-18 NOTE — Progress Notes (Signed)
Subjective:    Patient ID: Monica Mora, female    DOB: Jan 28, 1935, 84 y.o.   MRN: [id number]  HPI    Here to f/u, overall edema significantly improved though mild to mod persists and she is quite pleased; Pt denies chest pain, increased sob or doe, wheezing, orthopnea, PND, increased LE swelling, palpitations, dizziness or syncope.  Has lost wt with better diet and diuretic, but wt not check last visit to compare.  Wt Readings from Last 3 Encounters:  05/18/19 181 lb 3.2 oz (82.2 kg)  01/07/19 203 lb (92.1 kg)  08/05/18 216 lb 9.6 oz (98.2 kg)  Has seen [facility], has appt next wk. Niece has been transporting. Having cataract out at [facility] next month.  Pt denies new neurological symptoms such as new headache, or facial or extremity weakness or numbness   Pt denies polydipsia, polyuria.  Is frustrated somewhat today as she can drive herself here, pharmacy and grocery, but has much difficulty getting from the car in the lot to inside the door as still mostly wheelchair bound, and does not want to use a walker.  No other new complaints Past Medical History:  Diagnosis Date  . Abnormal chest x-ray   . Atrial fibrillation ([place])   . CHF (congestive heart failure) ([place])   . Constrictive pericarditis    s/p pericardial window in 2002 in [place]  . Glaucoma   . H/O: hysterectomy 1992  . Heart murmur   . Hyperlipidemia   . Nontoxic multinodular goiter   . Other voice and resonance disorders   . TB (tuberculosis) 2002   positive skin test, negative chest x-ray  . Unspecified essential hypertension   . Unspecified hypothyroidism    Past Surgical History:  Procedure Laterality Date  . ABDOMINAL HYSTERECTOMY  1992  . PERICARDIAL WINDOW  2002  . Radioactive Iodine for hyperthyroidism/Multinodular goiter     s/p  . VIDEO BRONCHOSCOPY Bilateral 01/21/2018   Procedure: VIDEO BRONCHOSCOPY WITH FLUORO;  Surgeon: Tanda Rockers, MD;  Location: [facility];  Service: Cardiopulmonary;   Laterality: Bilateral;  . VIDEO BRONCHOSCOPY WITH ENDOBRONCHIAL NAVIGATION Right 02/11/2018   Procedure: VIDEO BRONCHOSCOPY WITH ENDOBRONCHIAL NAVIGATION;  Surgeon: Collene Gobble, MD;  Location: [facility];  Service: Thoracic;  Laterality: Right;  Marland Kitchen VIDEO BRONCHOSCOPY WITH ENDOBRONCHIAL ULTRASOUND Right 02/11/2018   Procedure: VIDEO BRONCHOSCOPY WITH ENDOBRONCHIAL ULTRASOUND;  Surgeon: Collene Gobble, MD;  Location: [facility];  Service: Thoracic;  Laterality: Right;    reports that she quit smoking about 40 years ago. Her smoking use included cigarettes. She has a 10.50 pack-year smoking history. She has never used smokeless tobacco. She reports that she does not drink alcohol or use drugs. family history includes Arthritis in her father and mother; Asthma in her brother; Cancer in her sister; Cervical cancer in her sister. No Known Allergies Current Outpatient Medications on File Prior to Visit  Medication Sig Dispense Refill  . albuterol (PROAIR HFA) 108 (90 Base) MCG/ACT inhaler Inhale 2 puffs into the lungs every 6 (six) hours as needed for wheezing or shortness of breath. 1 Inhaler 3  . apixaban (ELIQUIS) 5 MG TABS tablet Take 1 tablet (5 mg total) by mouth 2 (two) times daily. 60 tablet 6  . brimonidine (ALPHAGAN) 0.2 % ophthalmic solution Place 1 drop into the left eye 3 (three) times daily.    . Cholecalciferol (VITAMIN D) 1000 UNITS capsule Take 1,000 Units by mouth daily.      . dorzolamide-timolol (COSOPT)  22.3-6.8 MG/ML ophthalmic solution Place 1 drop into both eyes 2 (two) times daily.     . famotidine (PEPCID) 20 MG tablet TAKE 1 TABLET BY MOUTH EVERYDAY AT BEDTIME 90 tablet 3  . furosemide (LASIX) 80 MG tablet Take 1 tablet (80 mg total) by mouth 2 (two) times daily. 60 tablet 5  . levothyroxine (SYNTHROID, LEVOTHROID) 75 MCG tablet TAKE 1 TABLET BY MOUTH EVERY DAY 90 tablet 1  . pantoprazole (PROTONIX) 40 MG tablet Take 1 tablet (40 mg total) by mouth daily. Take 30-60 min before first  meal of the day 90 tablet 3  . pilocarpine (PILOCAR) 1 % ophthalmic solution Place 1 drop into the left eye 3 (three) times daily.    . predniSONE (DELTASONE) 5 MG tablet TAKE 3 TABLETS (15 MG TOTAL) BY MOUTH DAILY WITH BREAKFAST. 90 tablet 1  . TOPROL XL 50 MG 24 hr tablet TAKE 1 TABLET BY MOUTH EVERY DAY WITH OR IMMEDIATELY FOLLOWING A MEAL 90 tablet 0  . Travoprost, BAK Free, (TRAVATAN) 0.004 % SOLN ophthalmic solution Place 1 drop into both eyes at bedtime.    . vitamin B-12 (CYANOCOBALAMIN) 500 MCG tablet Take 500 mcg by mouth daily.    . vitamin k 100 MCG tablet Take 20 tablets (2,000 mcg total) by mouth daily. 20 tablet 0   No current facility-administered medications on file prior to visit.    Review of Systems  Constitutional: Negative for other unusual diaphoresis or sweats HENT: Negative for ear discharge or swelling Eyes: Negative for other worsening visual disturbances Respiratory: Negative for stridor or other swelling  Gastrointestinal: Negative for worsening distension or other blood Genitourinary: Negative for retention or other urinary change Musculoskeletal: Negative for other MSK pain or swelling Skin: Negative for color change or other new lesions Neurological: Negative for worsening tremors and other numbness  Psychiatric/Behavioral: Negative for worsening agitation or other fatigue All other system neg per pt    Objective:   Physical Exam BP 138/84 (BP Location: Left Arm)   Pulse 65   Temp 97.9 F (36.6 C) (Oral)   Wt 181 lb 3.2 oz (82.2 kg)   SpO2 93% Comment: w/4 liter oxygen  BMI 29.25 kg/m  VS noted, nontoxic Constitutional: Pt appears in NAD HENT: Head: NCAT.  Right Ear: External ear normal.  Left Ear: External ear normal.  Eyes: . Pupils are equal, round, and reactive to light. Conjunctivae and EOM are normal Nose: without d/c or deformity Neck: Neck supple. Gross normal ROM Cardiovascular: Normal rate and regular rhythm.   Pulmonary/Chest:  Effort normal and breath sounds without rales or wheezing.  Abd:  Soft, NT, ND, + BS, no organomegaly Neurological: Pt is alert. At baseline orientation, motor grossly intact Skin: Skin is warm. No rashes, other new lesions, has 1+ bilat LE edema to mid thighs Psychiatric: Pt behavior is normal without agitation  No other exam findings Lab Results  Component Value Date   WBC 6.4 03/30/2019   HGB 14.6 03/30/2019   HCT 43.8 03/30/2019   PLT 137.0 (L) 03/30/2019   GLUCOSE 91 05/18/2019   CHOL 120 03/30/2019   TRIG 83.0 03/30/2019   HDL 25.10 (L) 03/30/2019   LDLDIRECT 149.7 07/17/2012   LDLCALC 78 03/30/2019   ALT 24 03/30/2019   AST 34 03/30/2019   NA 135 05/18/2019   K 3.4 (L) 05/18/2019   CL 96 05/18/2019   CREATININE 0.88 05/18/2019   BUN 8 05/18/2019   CO2 32 05/18/2019   TSH 6.03 (  H) 03/30/2019   INR 12.2 (HH) 04/22/2019   HGBA1C 6.1 03/30/2019          Assessment & Plan:

## 2019-05-20 ENCOUNTER — Other Ambulatory Visit: Payer: Self-pay | Admitting: *Deleted

## 2019-05-20 NOTE — Patient Outreach (Addendum)
[facility]) Care Management  05/20/2019  [place] [birth year] [id number]   Telephone Screen  Referral Date: 05/19/19 Referral Source: MD referral Dr Cathlean Cower Referral Reason: chf persistent Insurance:united health care medicare     Outreach attempt # 1 Reached Ms Hoge She voiced difficulty in hearing RN CM. CM briefly explained the reason for the call She confirms being at Dr Gwynn Burly office this week. She informs CM she is on the phone with a niece as a family member has passed She requests a return call to her    Plan: [facility] RN CM sent an unsuccessful outreach letter and scheduled this patient for another call attempt within 4 business days  Jakiah Goree L. Lavina Hamman, RN, BSN, [place] Coordinator Office number [phone] Mobile number [phone]  Main [facility] number [phone] Fax number [phone]

## 2019-05-21 ENCOUNTER — Encounter: Payer: Self-pay | Admitting: *Deleted

## 2019-05-21 ENCOUNTER — Other Ambulatory Visit: Payer: Self-pay | Admitting: *Deleted

## 2019-05-21 DIAGNOSIS — C3431 Malignant neoplasm of lower lobe, right bronchus or lung: Secondary | ICD-10-CM | POA: Diagnosis not present

## 2019-05-21 NOTE — Patient Outreach (Signed)
[facility]) Care Management  05/21/2019  [place] 1934/12/04 [id number]   Telephone Screen  Referral Date: 05/19/19 Referral Source: MD referral Dr Cathlean Cower Referral Reason: CHF persistent Insurance:united health care medicare     Outreach attempt # 2 to the home number  She also gives permission to be called on her mobile number prn as she reports volume issues with her home phone at times  Patient is able to verify HIPAA Reviewed and addressed MD referral to [facility] with patient  Ms Onstott confirms her office visit to see Dr Jenny Reichmann on 05/18/19. She shared concern with not being able to be seen in her car (previously done during early 2020 visits r/t Covid Pandemic precautions) as she has difficulty walking up the incline to get to the MD office by herself. She was encouraged to have her nieces assist her to appointments but she states her nieces do not live locally and she would not like to bother them to drive from [place] area for her appointments. [facility] RN CM discussed possible [facility] SW services for personal care services staff. She wants to discuss at a later time  She reports her other primary providers are now with the [facility] or [facility]) system. CM to consult care everywhere.    [facility] RN CM assessed today for CHF s/s and she only confirms swelling She denies sob, chest pain Ms Class reports her interventions for her swelling are attempts to sit outside in the sun with legs elevated, use of an incentive spirometer (given to her by [facility] staff) with reps of 10 inhalations and use of her nebulizer prn   Ms Marchena states her primary concerns are respiratory and edema She recalls having "surgery" in 2002 in [place] for CHF to "remove fluid from around my heart" but states she does not recall having CHF concerns other than in [place]. [facility] RN CM dicussed the similar s/s of CHF, edema and sob/respiratory issues.    Ms Santilli explains she went to [facility] in March 2020 related to her respiratory concerns Ms Drabik reports she had been seen Dr Melvyn Novas related to respiratory issues without improvement in 2019 until she started to be seen by [facility] provider and had an admission for PNA. She reports various evaluations,  labs, walking tests, etc   Social: Ms Cornforth is single, retired (office work, worked at a prison) and lives alone but has assist from her Niece, Belenda Cruise (called Kathy-Brandon Whiteside) and great niece, Jasmine (RN at [employer])) She is independent/assist with her care needs. She reports having difficulty with seeing recently (blurred vision need of brighter lights to read, poor vision at night) and has her nieces to assist with reading related to her visual concerns. She reports having mobility concerns but presently is not using mobility DME. She reports having difficulty walking up inclines without assist. One of her nieces attends her medical appointments with her and transports her prn. She reports this is her preference as she does not want to forget some things her providers discusses with her. She states some concern with memory.  She is able to drive to medical appointments, to the bank, grocery store or pharmacy independently locally.   She reports her preference not to have any home visits and to stay at home as much as possible to decrease her risks during the Covid pandemic. She is able to dress, bath and feed herself. Ms Rampey reports she does not get to check  her mailbox frequently and generally drives to it in her car. She has access to e -mail but has not obtained a new e mail address as she no longer has access to her [employer] e-mail account. She changed telephone company providers.    Conditions: ILD (interstitial lung disease), hx of PNA, pulmonary HTN, HTN, constrictive pericarditis, paroxysmal atrial fibrillation, allergic rhinitis, Hx of CAP, GERD, multinodular goiter, hypothyroidism  following radioiodine tx for thyroid cancer, osteopenia, trigger finger, HLD, glaucoma & laser surgery (Dr Elonda Husky), edema, hx of chest pain, hx of shingles rash, DOE, hx of coumadin toxicity, lung nodule 3/14//19 and on 09/17/18 CT, hx of positive ANA, former smoker   Falls none per Ms Arcand  DME: nebulizer, inhalers, incentive spirometer, glasses, 4 L oxygen, walker (has does not use)   Medications:  She denies concerns with taking medications as prescribed, affording medications, side effects of medications and questions about medications   Appointments:  Dr Rodman Key [facility]  ophthalmology last seen on 05/10/19 and 05/14/19 Dr Raynelle Fanning seen on 05/12/19 ophthalmology 05/14/19 was seen by Saw Dr Cecilio Asper 05/18/19 and to f/u  05/26/19 Wednesday Dr Vonita Moss Pulmonology- She plans for her niece to meet her and take her to this appointment   07/06/19 She reports she is to have cataract removal -Glaucoma surgery 07/21/19 Wednesday Dr Martinique Whitson, pulmonology-initial consult to see her related to pulmonary "anxiety" 08/04/19 Renato Shin, endocrinologist (hypothyroidism)   Advance Directives:  Her niece, Juliann Pulse, is her POA. A DNR was completed at [facility] on 09/10/2018   Consent: [facility] RN CM reviewed [facility] services with patient. Patient gave verbal consent for services [facility] telephonic RN CM.   Plan: [facility] RN CM will follow up with Ms Summerlin within 7-10 business days if she is not able to return a call to [place] as agreed after her next [address] appointment. Cm would like to f/u on further assessment, education ans possible services.  Ms Axelson was given [facility] RN CM contact information as she would like to review the call with her niece and check for availability for her, her niece and CM to have a conference call  [facility] RN CM discussed the outreach letter sent that includes a brochure for [facility] services  Routed note to primary care MD  Joelene Millin L. Lavina Hamman, RN, BSN, [place] Coordinator Office number [phone] Mobile number [phone]  Main [facility] number [phone] Fax number [phone]

## 2019-05-22 DIAGNOSIS — J9611 Chronic respiratory failure with hypoxia: Secondary | ICD-10-CM | POA: Diagnosis not present

## 2019-05-26 DIAGNOSIS — R0602 Shortness of breath: Secondary | ICD-10-CM | POA: Diagnosis not present

## 2019-05-26 DIAGNOSIS — Z87891 Personal history of nicotine dependence: Secondary | ICD-10-CM | POA: Diagnosis not present

## 2019-05-26 DIAGNOSIS — C3491 Malignant neoplasm of unspecified part of right bronchus or lung: Secondary | ICD-10-CM | POA: Diagnosis not present

## 2019-05-26 DIAGNOSIS — J9611 Chronic respiratory failure with hypoxia: Secondary | ICD-10-CM | POA: Diagnosis not present

## 2019-05-26 DIAGNOSIS — R918 Other nonspecific abnormal finding of lung field: Secondary | ICD-10-CM | POA: Diagnosis not present

## 2019-05-26 DIAGNOSIS — I480 Paroxysmal atrial fibrillation: Secondary | ICD-10-CM | POA: Diagnosis not present

## 2019-05-26 DIAGNOSIS — I272 Pulmonary hypertension, unspecified: Secondary | ICD-10-CM | POA: Diagnosis not present

## 2019-05-26 DIAGNOSIS — C3431 Malignant neoplasm of lower lobe, right bronchus or lung: Secondary | ICD-10-CM | POA: Diagnosis not present

## 2019-05-27 ENCOUNTER — Other Ambulatory Visit: Payer: Self-pay | Admitting: *Deleted

## 2019-05-27 NOTE — Patient Outreach (Signed)
[facility]) Care Management  05/27/2019  [place] 1935/01/28 [id number]   Care coordination  [facility] RN CM received a message left by Ms Schwering requesting a return call to coordinate a conference call with her and Juliann Pulse, her niece.   [facility] RN CM returned a call to her Patient is able to verify HIPAA  Consent: [facility] RN CM reviewed [facility] services with patient. Patient gave verbal consent for services.  Ms [facility] RN CM confirmed and scheduled a Monday May 31 2019 1 pm conference call with her and her niece  She confirm having her appointment at [facility] and will return in 3 weeks for a CT scan to compare with her last CT completed in [place] will follow up with Ms [place] within 3-5 business days  [place] L. Lavina Hamman, RN, BSN, [place] Coordinator Office number [phone] Mobile number [phone]  Main [facility] number [phone] Fax number [phone]

## 2019-05-28 ENCOUNTER — Ambulatory Visit: Payer: Self-pay | Admitting: *Deleted

## 2019-05-31 ENCOUNTER — Other Ambulatory Visit: Payer: Self-pay | Admitting: *Deleted

## 2019-05-31 ENCOUNTER — Other Ambulatory Visit: Payer: Self-pay

## 2019-05-31 NOTE — Patient Outreach (Signed)
[facility]) Care Management  05/31/2019  [place] 05-Jan-1935 [id number]    Conference call with Patient and her niece   Initial [facility] Referral Date:05/19/19 Referral Source:MD referral Dr Cathlean Cower Referral Reason:CHF persistent Insurance: united health care medicare    Outreach # 3 to the home number but Pt request a call to her mobile number and Juliann Pulse, niece number Conference call with the patient and niece, Juliann Pulse She gives permission to be called on her mobile number as she reports volume issues with her home phone at times  Patient is able to verify HIPAA Reviewed and addressed MD referral to [facility] patient and niece Juliann Pulse Reviewed [facility] program and services. Reviewed previous contact with Ms Spratling for niece  Ms Schwenke voiced a concern today about upcoming tests at [facility], PET CT scan  She reports being slower related to her respiratory illnesses. She has ILD and pulmonary HTN. Ms Thiem discussed canceling her PET CT scan as she does not understand the test and felt it may interfere with her upcoming eye surgery related to radiation. [facility] RN CM and her niece discussed with her that the PET SCAN is needed for comparison with previous tests to check for new abnormalities. [facility] RN CM and Niece encouraged her not to cancel the PET CT scan and discussed the details of the order she states is written on her after visit forms. It mentions "skull base"   She, her niece and [facility] RN CM discussed her concern with the multiple testing and  appointments scheduled at [facility] on 07/21/19. Ms Wild is to call the providers to see if possibly the appointments can be spread out to give her breaks and to see if assist will be provided to go from the 6 different locations of testing and appointments. Ms Gritz reports this makes her anxious Her after visit forms indicates she has to arrive at [place] and end 2-3 pm  For this appointment Juliann Pulse will pick Ms Alderete up  during the prior weekend and then transport her to her appointments on Wednesday July 21 2019  Ms Neidert is very knowledgeable of her medical conditions but agrees to have [facility] RN CM staff to reach out to her monthly or quarterly to answer questions and provide education for her medical conditions  Ms Rosenzweig and Juliann Pulse voice appreciation of Dr Maceo Pro patience and time with review of the test results during the last visit  Want transportation resources services for car to dr office  Want someone to help get in appointment esp Dr Jenny Reichmann Appreciate fried time took with appointment    Ms Hiser has difficulty walking up the incline to get to Dr Gwynn Burly office by herself. She was encouraged per pt during the last office visit to have her nieces assist her to appointments but she states her nieces do not live locally and she would not like to bother them to drive from [place] area for her [facility] appointments. [facility] RN CM discussed possible [facility] SW services for personal care services staff, sitters, & senior services with her on .  Consulted with [facility] SW    Social: Ms Wadleigh is single, retired (office work, worked at a prison) and lives alone but has assist from her Niece, Belenda Cruise (called [employer]- works from home) and great niece, Jasmine (RN at [employer])) She is independent/assist with her care needs. She reports having difficulty with seeing recently (blurred vision need of brighter lights to read, poor vision at night) and  has her nieces to assist with reading related to her visual concerns. She reports having mobility concerns but presently is not using mobility DME. She reports being slower related to her respiratory illnesses. She reports having difficulty walking up inclines without assist. One of her nieces attends her medical appointments with her and transports her prn. She reports this is her preference as she does not want to forget some things her providers discusses  with her. She states some concern with memory.  She is able to drive to medical appointments, to the bank, grocery store or pharmacy independently locally.   She reports her preference not to have any home visits and to stay at home as much as possible to decrease her risks during the Covid pandemic. She is able to dress, bath and feed herself. Ms Vogelsang reports she does not get to check her mailbox frequently and generally drives to it in her car. She has access to e -mail but has not obtained a new e mail address as she no longer has access to her [employer] e-mail account. She changed telephone company providers.    Conditions: ILD (interstitial lung disease), hx of PNA, pulmonary HTN, HTN, constrictive pericarditis, paroxysmal atrial fibrillation, allergic rhinitis, Hx of CAP, GERD, multinodular goiter, hypothyroidism following radioiodine tx for thyroid cancer, osteopenia, trigger finger, HLD, glaucoma & laser surgery (Dr Elonda Husky), edema, hx of chest pain, hx of shingles rash, DOE, hx of coumadin toxicity, lung nodule 3/14//19 and on 09/17/18 CT, hx of positive ANA, former smoker,  "surgery" in 2002 in [place] for CHF to "remove fluid from around my heart"- She denies CHF presently   Appointments:  Dr Rodman Key [facility]  ophthalmology last seen on 05/10/19 and 05/14/19 Dr Raynelle Fanning seen on 05/12/19 ophthalmology Saw Dr Cathlean Cower 05/18/19 and to f/u  05/26/19 Wednesday Dr Vonita Moss Pulmonology- She plans for her niece to meet her and take her to this appointment   07/06/19 She reports she is to have cataract removal -Glaucoma surgery 07/21/19 Wednesday Dr Martinique Whitson, pulmonology-initial consult to see her related to pulmonary HTN plus radiology PET CT scan, labs (6+ different test/visits)  08/04/19 Renato Shin, endocrinologist (hypothyroidism)   Advance Directives:  Her niece, Juliann Pulse, is her POA. A DNR was completed at [facility] on 09/10/2018   Consent: [facility] RN CM reviewed [facility] services  with patient. Patient gave verbal consent for services [facility] telephonic RN CM an [facility] SW   Plan: [facility] RN CM will refer Ms Arreguin to [facility] SW for assistance with a [place] [employer] to assist her with safety and mobility when getting to primary care provider's office. She is willing to pay for services.   [facility] RN CM will refer Ms Debose to [facility] health coach services for monthly or quarterly follow up or education for medical diagnoses ILD, pulmonary HTN,  atrial fibrillation  Routed note to primary care MD  [facility] RN CM provided pt and [facility] RN's contact number to call as needed   Jolan [place] L. Lavina Hamman, RN, BSN, [place] Coordinator Office number [phone] Mobile number [phone]  Main [facility] number [phone] Fax number [phone]

## 2019-06-01 ENCOUNTER — Other Ambulatory Visit: Payer: Self-pay

## 2019-06-01 NOTE — Patient Outreach (Signed)
[facility]) Care Management  06/01/2019  [place] [birth year] [id number]   Social work referral received from [employer], Joellyn Quails.   "Patient needs assistance with a [employer] to assist her with safety and mobility when getting to primary care provider's office. She is willing to pay for services.  Conditions: ILD (interstitial lung disease), hx of PNA, pulmonary HTN, HTN, constrictive pericarditis, paroxysmal atrial fibrillation, allergic rhinitis, Hx of CAP, GERD, multinodular goiter, hypothyroidism following radioiodine tx for thyroid cancer, osteopenia, trigger finger, HLD, glaucoma & laser surgery (Dr Elonda Husky), edema, hx of chest pain, hx of shingles rash, DOE, hx of coumadin toxicity, lung nodule 3/14//19 and on 09/17/18 CT, hx of positive ANA, former smoker" Successful outreach to patient today, however, she requested call back tomorrow.  BSW will call again tomorrow.   Ronn Melena, BSW Social Worker [phone]

## 2019-06-02 ENCOUNTER — Other Ambulatory Visit: Payer: Self-pay

## 2019-06-02 NOTE — Patient Outreach (Signed)
[facility]) Care Management  06/02/2019  [place] 29-Sep-1935 [id number]   Social work referral received from [employer], Joellyn Quails. "Patient needs assistance with a [employer] to assist her with safety and mobility when getting to primary care provider's office. She is willing to pay for services.' Successful outreach to patient today.  Multiple resource options were discussed with patient, however, she reports needing someone to accompany her into and out of MD office and potentially attend appointment.  Per patient, someone at MD office assisted her in and out of the vehicle during her last appointment but she was told that they will not be able to help next time.  The following was discussed with patient.  [employer]-offered to submit application on patient's behalf but informed her that they provide door to door or curb to curb service so they will not escort into the building. Patient declined application mostly because she does not want to be transported with multiple people during this time of COVID.    [employer]-patient has contact information to confirm transportation benefit, however, she was informed that they will also not be able to accompany her into building.  [employer]-BSW informed patient about services.  They are currently accepting new referrals, however, have minimal volunteers available at this time due to [place].  Offered to send her application for services but she declined.    Patient did say that she has a niece that is able to provide transportation but she does not want to ask her to drive from [place] to [place] for a "20 minute appointment".   BSW is closing case due to all possible resources being offered.  Encouraged patient to call if additional needs arise or if she changes her mind about.  Ronn Melena, BSW Social Worker [phone]

## 2019-06-15 ENCOUNTER — Other Ambulatory Visit: Payer: Self-pay | Admitting: Internal Medicine

## 2019-06-16 ENCOUNTER — Other Ambulatory Visit: Payer: Self-pay

## 2019-06-16 NOTE — Patient Outreach (Signed)
[facility]) Care Management  06/16/2019  [place] September 17, 1935 [id number]    1st outreach to the patient.  The patient answered the phone and I introduced myself.  She stated that she needed to run a couple of errands today and could I call back.  Plan: RN Health Coach will make another outreach attempt to the patient within thirty business days.  Lazaro Arms RN, BSN, [place] Direct Dial:  [phone]  Fax: [phone]

## 2019-06-18 ENCOUNTER — Other Ambulatory Visit: Payer: Self-pay

## 2019-06-18 DIAGNOSIS — J9611 Chronic respiratory failure with hypoxia: Secondary | ICD-10-CM | POA: Diagnosis not present

## 2019-06-18 NOTE — Patient Outreach (Signed)
[place] [facility]) Care Management  06/18/2019  [place] 1935-02-09 [id number]   2nd outreach attempt to the patient for initial assessment.  The patient stated that she is on a call and could not talk today. Asked if I could call her another day.  Plan: RN Health Coach will make outreach attempt to the patient within thirty business  days.  Lazaro Arms RN, BSN, [place] Direct Dial:  [phone]  Fax: [phone]

## 2019-06-22 ENCOUNTER — Other Ambulatory Visit: Payer: Self-pay

## 2019-06-22 DIAGNOSIS — J9611 Chronic respiratory failure with hypoxia: Secondary | ICD-10-CM | POA: Diagnosis not present

## 2019-06-22 NOTE — Patient Outreach (Signed)
[facility]) Care Management  06/22/2019  [place] 04-05-1935 [id number]    3rd outreach to the patient for initial assessment.  The patient states that she does not want to commit to anything now.  She needs to get her eyes taken care of before she commits to the program.  I gave the patient my contact information again and asked her to call when she is ready.  She stated that she would.  Plan: RN Health Coach has made several attempts to outreach the patient.  If no response to calls and letter in ten business days [place] will proceed with case closure.   Lazaro Arms RN, BSN, [place] Direct Dial:  [phone]  Fax: [phone]

## 2019-06-27 ENCOUNTER — Other Ambulatory Visit: Payer: Self-pay | Admitting: Endocrinology

## 2019-07-05 ENCOUNTER — Other Ambulatory Visit: Payer: Self-pay

## 2019-07-05 NOTE — Patient Outreach (Signed)
[place] [facility]) Care Management  07/05/2019  [place] 1934/11/24 [id number]    Multiple attempts to establish contact with the patient without success. No response from calls or letter mailed to patient.   Plan: Case is being closed at this time.  Lazaro Arms RN, BSN, [place] Direct Dial:  [phone]  Fax: [phone]

## 2019-07-13 ENCOUNTER — Other Ambulatory Visit: Payer: Self-pay | Admitting: Internal Medicine

## 2019-07-14 DIAGNOSIS — H2513 Age-related nuclear cataract, bilateral: Secondary | ICD-10-CM | POA: Diagnosis not present

## 2019-07-14 DIAGNOSIS — Z01818 Encounter for other preprocedural examination: Secondary | ICD-10-CM | POA: Diagnosis not present

## 2019-07-14 DIAGNOSIS — H401123 Primary open-angle glaucoma, left eye, severe stage: Secondary | ICD-10-CM | POA: Diagnosis not present

## 2019-07-19 ENCOUNTER — Other Ambulatory Visit: Payer: Self-pay

## 2019-07-19 DIAGNOSIS — J9611 Chronic respiratory failure with hypoxia: Secondary | ICD-10-CM | POA: Diagnosis not present

## 2019-07-19 MED ORDER — TOPROL XL 50 MG PO TB24: ORAL_TABLET | ORAL | 0 refills | Status: DC

## 2019-07-20 DIAGNOSIS — H401112 Primary open-angle glaucoma, right eye, moderate stage: Secondary | ICD-10-CM | POA: Diagnosis not present

## 2019-07-20 DIAGNOSIS — I509 Heart failure, unspecified: Secondary | ICD-10-CM | POA: Diagnosis not present

## 2019-07-20 DIAGNOSIS — H25812 Combined forms of age-related cataract, left eye: Secondary | ICD-10-CM | POA: Diagnosis not present

## 2019-07-20 DIAGNOSIS — E039 Hypothyroidism, unspecified: Secondary | ICD-10-CM | POA: Diagnosis not present

## 2019-07-20 DIAGNOSIS — H401123 Primary open-angle glaucoma, left eye, severe stage: Secondary | ICD-10-CM | POA: Diagnosis not present

## 2019-07-20 DIAGNOSIS — I272 Pulmonary hypertension, unspecified: Secondary | ICD-10-CM | POA: Diagnosis not present

## 2019-07-20 DIAGNOSIS — R918 Other nonspecific abnormal finding of lung field: Secondary | ICD-10-CM | POA: Diagnosis not present

## 2019-07-20 DIAGNOSIS — I4891 Unspecified atrial fibrillation: Secondary | ICD-10-CM | POA: Diagnosis not present

## 2019-07-20 DIAGNOSIS — K219 Gastro-esophageal reflux disease without esophagitis: Secondary | ICD-10-CM | POA: Diagnosis not present

## 2019-07-20 DIAGNOSIS — Z87891 Personal history of nicotine dependence: Secondary | ICD-10-CM | POA: Diagnosis not present

## 2019-07-20 DIAGNOSIS — J449 Chronic obstructive pulmonary disease, unspecified: Secondary | ICD-10-CM | POA: Diagnosis not present

## 2019-07-20 DIAGNOSIS — J849 Interstitial pulmonary disease, unspecified: Secondary | ICD-10-CM | POA: Diagnosis not present

## 2019-07-20 DIAGNOSIS — H2513 Age-related nuclear cataract, bilateral: Secondary | ICD-10-CM | POA: Diagnosis not present

## 2019-07-21 ENCOUNTER — Emergency Department (HOSPITAL_COMMUNITY)
Admission: EM | Admit: 2019-07-21 | Discharge: 2019-07-21 | Disposition: A | Discharge status: Home / Self Care | Payer: Medicare Other | Attending: Emergency Medicine | Admitting: Emergency Medicine

## 2019-07-21 ENCOUNTER — Emergency Department (HOSPITAL_COMMUNITY): Payer: Medicare Other

## 2019-07-21 ENCOUNTER — Other Ambulatory Visit: Payer: Self-pay

## 2019-07-21 ENCOUNTER — Encounter (HOSPITAL_COMMUNITY): Payer: Self-pay | Admitting: Emergency Medicine

## 2019-07-21 DIAGNOSIS — Z87891 Personal history of nicotine dependence: Secondary | ICD-10-CM | POA: Diagnosis not present

## 2019-07-21 DIAGNOSIS — E890 Postprocedural hypothyroidism: Secondary | ICD-10-CM | POA: Insufficient documentation

## 2019-07-21 DIAGNOSIS — I110 Hypertensive heart disease with heart failure: Secondary | ICD-10-CM | POA: Diagnosis not present

## 2019-07-21 DIAGNOSIS — Z79899 Other long term (current) drug therapy: Secondary | ICD-10-CM | POA: Insufficient documentation

## 2019-07-21 DIAGNOSIS — I509 Heart failure, unspecified: Secondary | ICD-10-CM | POA: Diagnosis not present

## 2019-07-21 DIAGNOSIS — R0602 Shortness of breath: Secondary | ICD-10-CM | POA: Diagnosis not present

## 2019-07-21 DIAGNOSIS — E161 Other hypoglycemia: Secondary | ICD-10-CM | POA: Diagnosis not present

## 2019-07-21 DIAGNOSIS — Z7901 Long term (current) use of anticoagulants: Secondary | ICD-10-CM | POA: Insufficient documentation

## 2019-07-21 DIAGNOSIS — E162 Hypoglycemia, unspecified: Secondary | ICD-10-CM | POA: Diagnosis not present

## 2019-07-21 DIAGNOSIS — R55 Syncope and collapse: Secondary | ICD-10-CM | POA: Diagnosis not present

## 2019-07-21 DIAGNOSIS — R0902 Hypoxemia: Secondary | ICD-10-CM | POA: Diagnosis not present

## 2019-07-21 LAB — COMPREHENSIVE METABOLIC PANEL WITH GFR
ALT: 23 U/L (ref 0–44)
AST: 40 U/L (ref 15–41)
Albumin: 3.3 g/dL — ABNORMAL LOW (ref 3.5–5.0)
Alkaline Phosphatase: 106 U/L (ref 38–126)
Anion gap: 14 (ref 5–15)
BUN: 19 mg/dL (ref 8–23)
CO2: 21 mmol/L — ABNORMAL LOW (ref 22–32)
Calcium: 9.2 mg/dL (ref 8.9–10.3)
Chloride: 98 mmol/L (ref 98–111)
Creatinine, Ser: 1.64 mg/dL — ABNORMAL HIGH (ref 0.44–1.00)
GFR calc Af Amer: 34 mL/min — ABNORMAL LOW
GFR calc non Af Amer: 29 mL/min — ABNORMAL LOW
Glucose, Bld: 101 mg/dL — ABNORMAL HIGH (ref 70–99)
Potassium: 4.3 mmol/L (ref 3.5–5.1)
Sodium: 133 mmol/L — ABNORMAL LOW (ref 135–145)
Total Bilirubin: 2.5 mg/dL — ABNORMAL HIGH (ref 0.3–1.2)
Total Protein: 7.4 g/dL (ref 6.5–8.1)

## 2019-07-21 LAB — CBC WITH DIFFERENTIAL/PLATELET
Abs Immature Granulocytes: 0.03 10*3/uL (ref 0.00–0.07)
Basophils Absolute: 0 10*3/uL (ref 0.0–0.1)
Basophils Relative: 1 %
Eosinophils Absolute: 0.1 10*3/uL (ref 0.0–0.5)
Eosinophils Relative: 2 %
HCT: 41.9 % (ref 36.0–46.0)
Hemoglobin: 14.3 g/dL (ref 12.0–15.0)
Immature Granulocytes: 1 %
Lymphocytes Relative: 35 %
Lymphs Abs: 2.2 10*3/uL (ref 0.7–4.0)
MCH: 35.8 pg — ABNORMAL HIGH (ref 26.0–34.0)
MCHC: 34.1 g/dL (ref 30.0–36.0)
MCV: 104.8 fL — ABNORMAL HIGH (ref 80.0–100.0)
Monocytes Absolute: 0.5 10*3/uL (ref 0.1–1.0)
Monocytes Relative: 9 %
Neutro Abs: 3.4 10*3/uL (ref 1.7–7.7)
Neutrophils Relative %: 52 %
Platelets: 153 10*3/uL (ref 150–400)
RBC: 4 MIL/uL (ref 3.87–5.11)
RDW: 16.9 % — ABNORMAL HIGH (ref 11.5–15.5)
WBC: 6.4 10*3/uL (ref 4.0–10.5)
nRBC: 0.3 % — ABNORMAL HIGH (ref 0.0–0.2)

## 2019-07-21 LAB — CBG MONITORING, ED: Glucose-Capillary: 92 mg/dL (ref 70–99)

## 2019-07-21 MED ORDER — SODIUM CHLORIDE 0.9 % IV BOLUS
500.0000 mL | Freq: Once | INTRAVENOUS | Status: AC
  Administered 2019-07-21: 500 mL via INTRAVENOUS

## 2019-07-21 NOTE — ED Notes (Signed)
Patient transported to X-ray 

## 2019-07-21 NOTE — ED Provider Notes (Signed)
[facility] DEPARTMENT Provider Note   CSN: [id number] Arrival date & time: 07/21/19  1732     History   Chief Complaint Chief Complaint  Patient presents with   Near Syncope    HPI Monica Mora is a 84 y.o. female.     HPI   84 y.o. female with CHF, pAfib on eliquis, hypothyroidism, GERD, pulmonary HTN on 4LPM [place], hx of constrictive pericarditis s/p window in 2002, hyperlipidemia, htn presents with concern for syncope or near-syncope.  Patient had cataract surgery yesterday. This morning had follow up appointment, was in normal state of health, walked into the appointment, was doing well walking around, no chest pain or dyspnea. She has not had a bowel movement for 4-5 days and began taking dulcolax.  Prior to eating today, she felt the need to have a bowel movement. Reports significant straining on the toilet attempting to have a bowel movement followed by passing a hard stool that was then followed by loose stool. She began to feel weak/lightheaded then symptoms worsened when she stood up in the bathroom.  She then began to feel more generally weak, lightheaded, felt like she was about to pass out.  The patient reports she doesn't believe that she did but she felt very close to it. She had called her niece into the bathroom who helped her sit down. No falls or trauma.  Niece believes she did briefly pass out as her head bobbed down and she did not respond. No seizurel ike activity. Pt denies chest pain. Feels like she had generalized weakness immediately making it feel difficult to breath briefly but otherwise did not have dyspnea. No preceding dyspnea or chest pain.  No headache, vomiting, numbness/weakness/trouble talking or walking. On EMS arrival, blood pressures low, however in ED have improved. Patient reports she feels back to baseline. She had one prior episode of syncope that occurred when she was changing in the bathroom in the hospital.  Sees Dr.  Harrington Challenger of cardiology.  Past Medical History:  Diagnosis Date   Abnormal chest x-ray    Atrial fibrillation (HCC)    CHF (congestive heart failure) (HCC)    Constrictive pericarditis    s/p pericardial window in 2002 in [place]   Glaucoma    H/O: hysterectomy 1992   Heart murmur    Hyperlipidemia    Nontoxic multinodular goiter    Other voice and resonance disorders    TB (tuberculosis) 2002   positive skin test, negative chest x-ray   Unspecified essential hypertension    Unspecified hypothyroidism     Patient Active Problem List   Diagnosis Date Noted   Hypokalemia 05/18/2019   Coumadin toxicity 04/22/2019   Peripheral edema 03/30/2019   Fibrosing mediastinitis 01/07/2019   ILD (interstitial lung disease) ([place]) 01/07/2019   Chronic respiratory failure with hypoxia ([place]) 05/30/2018   Positive ANA (antinuclear antibody) 05/04/2018   Nocturnal hypoxemia 04/15/2018   Other secondary pulmonary hypertension ([place]) 03/08/2018   Pulmonary infiltrates on CXR 02/13/2018   Mediastinal lymphadenopathy    Lung nodule 01/01/2018   Upper airway cough syndrome 11/22/2017   Abnormal PFTs (pulmonary function tests) 10/17/2017   CAP (community acquired pneumonia) 07/09/2017   Allergic rhinitis 07/03/2017   Dyspnea on exertion 05/22/2017   Viral bronchitis 09/15/2015   Shingles rash 04/25/2014   Trigger finger, acquired 03/30/2014   Fever blister 05/18/2013   GERD (gastroesophageal reflux disease) 02/03/2012   Encounter for well adult exam with abnormal findings 10/26/2011  Chronic anticoagulation 01/24/2011   Chest pain 11/14/2010   Other and unspecified coagulation defects 10/01/2010   FATIGUE 06/20/2010   Hyperlipidemia 05/09/2009   GLAUCOMA 05/09/2009   Edema 05/09/2009   Hypothyroidism following radioiodine therapy 07/04/2008   OSTEOPENIA 06/18/2008   Nonspecific (abnormal) findings on radiological and other examination of body  structure 12/02/2007   Abnormal breath sounds 12/02/2007   GOITER, MULTINODULAR 10/07/2007   Essential hypertension 10/07/2007   PERICARDITIS, CONSTRICTIVE 10/07/2007   ATRIAL FIBRILLATION, PAROXYSMAL 10/07/2007   HOARSENESS 10/07/2007    Past Surgical History:  Procedure Laterality Date   ABDOMINAL HYSTERECTOMY  1992   PERICARDIAL WINDOW  2002   Radioactive Iodine for hyperthyroidism/Multinodular goiter     s/p   VIDEO BRONCHOSCOPY Bilateral 01/21/2018   Procedure: VIDEO BRONCHOSCOPY WITH FLUORO;  Surgeon: Tanda Rockers, MD;  Location: [facility];  Service: Cardiopulmonary;  Laterality: Bilateral;   VIDEO BRONCHOSCOPY WITH ENDOBRONCHIAL NAVIGATION Right 02/11/2018   Procedure: VIDEO BRONCHOSCOPY WITH ENDOBRONCHIAL NAVIGATION;  Surgeon: Collene Gobble, MD;  Location: [facility] OR;  Service: Thoracic;  Laterality: Right;   VIDEO BRONCHOSCOPY WITH ENDOBRONCHIAL ULTRASOUND Right 02/11/2018   Procedure: VIDEO BRONCHOSCOPY WITH ENDOBRONCHIAL ULTRASOUND;  Surgeon: Collene Gobble, MD;  Location: [facility] OR;  Service: Thoracic;  Laterality: Right;     OB History   No obstetric history on file.      Home Medications    Prior to Admission medications   Medication Sig Start Date End Date Taking? Authorizing Provider  albuterol (PROAIR HFA) 108 (90 Base) MCG/ACT inhaler Inhale 2 puffs into the lungs every 6 (six) hours as needed for wheezing or shortness of breath. 10/08/18   Tanda Rockers, MD  apixaban (ELIQUIS) 5 MG TABS tablet Take 1 tablet (5 mg total) by mouth 2 (two) times daily. 04/16/19   Fay Records, MD  brimonidine (ALPHAGAN) 0.2 % ophthalmic solution Place 1 drop into the left eye 3 (three) times daily.    [provider]  Cholecalciferol (VITAMIN D) 1000 UNITS capsule Take 1,000 Units by mouth daily.      [provider]  dorzolamide-timolol (COSOPT) 22.3-6.8 MG/ML ophthalmic solution Place 1 drop into both eyes 2 (two) times daily.  11/02/11   [provider]  famotidine (PEPCID) 20 MG tablet TAKE 1 TABLET BY MOUTH EVERYDAY AT BEDTIME 04/22/19   Biagio Borg, MD  furosemide (LASIX) 80 MG tablet Take 1 tablet (80 mg total) by mouth 2 (two) times daily. 04/08/19   Biagio Borg, MD  levothyroxine (SYNTHROID) 75 MCG tablet TAKE 1 TABLET BY MOUTH EVERY DAY 06/28/19   Renato Shin, MD  pantoprazole (PROTONIX) 40 MG tablet Take 1 tablet (40 mg total) by mouth daily. Take 30-60 min before first meal of the day 04/22/19   Biagio Borg, MD  pilocarpine [facility]) 1 % ophthalmic solution Place 1 drop into the left eye 3 (three) times daily.    [provider]  potassium chloride (KLOR-CON 10) 10 MEQ tablet Take 1 tablet (10 mEq total) by mouth daily. 05/18/19   Biagio Borg, MD  predniSONE (DELTASONE) 5 MG tablet TAKE 3 TABLETS (15 MG TOTAL) BY MOUTH DAILY WITH BREAKFAST. 05/03/19   Tanda Rockers, MD  TOPROL XL 50 MG 24 hr tablet TAKE 1 TABLET BY MOUTH EVERY DAY WITH OR IMMEDIATELY FOLLOWING A MEAL. Please schedule appt for future refills. 07/19/19   Fay Records, MD  Travoprost, BAK Free, (TRAVATAN) 0.004 % SOLN ophthalmic solution Place 1  drop into both eyes at bedtime.    [provider]  vitamin B-12 (CYANOCOBALAMIN) 500 MCG tablet Take 500 mcg by mouth daily.    [provider]  vitamin k 100 MCG tablet Take 20 tablets (2,000 mcg total) by mouth daily. 04/22/19   Janith Lima, MD    Family History Family History  Problem Relation Age of Onset   Asthma Brother    Arthritis Mother    Arthritis Father    Cervical cancer Sister    Cancer Sister        Cervical Cancer    Social History Social History   Tobacco Use   Smoking status: Former Smoker    Packs/day: 0.50    Years: 21.00    Pack years: 10.50    Types: Cigarettes    Quit date: 10/21/1978    Years since quitting: 40.7   Smokeless tobacco: Never Used  Substance Use Topics   Alcohol use: No   Drug use: No     Allergies   Patient has no  known allergies.   Review of Systems Review of Systems  Constitutional: Positive for fatigue (with episode of lightheadedness). Negative for fever.  HENT: Negative for sore throat.   Eyes: Negative for visual disturbance.  Respiratory: Positive for shortness of breath (after syncopal/near syncopal episode as feeling generally weak, no current). Negative for cough.   Cardiovascular: Positive for leg swelling. Negative for chest pain.  Gastrointestinal: Negative for abdominal pain, nausea and vomiting.  Genitourinary: Negative for difficulty urinating.  Musculoskeletal: Negative for back pain and neck pain.  Skin: Negative for rash.  Neurological: Positive for syncope (pt denies but niece reports suspected) and light-headedness. Negative for seizures, facial asymmetry, speech difficulty, weakness, numbness and headaches.     Physical Exam Updated Vital Signs BP 109/65    Pulse (!) 55    Temp (!) 96.8 F (36 C) (Axillary)    Resp 14    SpO2 95%   Physical Exam Vitals signs and nursing note reviewed.  Constitutional:      General: She is not in acute distress.    Appearance: She is well-developed. She is not diaphoretic.  HENT:     Head: Normocephalic and atraumatic.  Eyes:     Conjunctiva/sclera: Conjunctivae normal.  Neck:     Musculoskeletal: Normal range of motion.  Cardiovascular:     Rate and Rhythm: Normal rate and regular rhythm.     Heart sounds: Normal heart sounds. No murmur. No friction rub. No gallop.   Pulmonary:     Effort: Pulmonary effort is normal. No respiratory distress.     Breath sounds: Normal breath sounds. No wheezing or rales.  Abdominal:     General: There is no distension.     Palpations: Abdomen is soft.     Tenderness: There is no abdominal tenderness. There is no guarding.  Musculoskeletal:        General: No tenderness.     Right lower leg: No edema.     Left lower leg: No edema.  Skin:    General: Skin is warm and dry.     Findings: No  erythema or rash.  Neurological:     Mental Status: She is alert and oriented to person, place, and time.      ED Treatments / Results  Labs (all labs ordered are listed, but only abnormal results are displayed) Labs Reviewed  CBC WITH DIFFERENTIAL/PLATELET - Abnormal; Notable for the following components:  Result Value   MCV 104.8 (*)    MCH 35.8 (*)    RDW 16.9 (*)    nRBC 0.3 (*)    All other components within normal limits  COMPREHENSIVE METABOLIC PANEL - Abnormal; Notable for the following components:   Sodium 133 (*)    CO2 21 (*)    Glucose, Bld 101 (*)    Creatinine, Ser 1.64 (*)    Albumin 3.3 (*)    Total Bilirubin 2.5 (*)    GFR calc non Af Amer 29 (*)    GFR calc Af Amer 34 (*)    All other components within normal limits  CBG MONITORING, ED    EKG EKG Interpretation  Date/Time:  Wednesday July 21 2019 17:42:21 EDT Ventricular Rate:  57 PR Interval:    QRS Duration: 77 QT Interval:  551 QTC Calculation: 537 R Axis:   102 Text Interpretation:  Sinus rhythm Probable left atrial enlargement Right axis deviation Low voltage, precordial leads Probable anteroseptal infarct, old No previous ECGs available Confirmed by Gareth Morgan [phone]) on 07/21/2019 7:49:50 PM   Radiology Dg Chest 2 View  Result Date: 07/21/2019 CLINICAL DATA:  84 year old female with shortness of breath and near syncope. EXAM: CHEST - 2 VIEW COMPARISON:  04/29/2018 chest radiographs. Chest CT 06/16/2018. FINDINGS: Upright AP and lateral views at 1946 hours. Stable cardiomegaly and mediastinal contours. Stable lung volumes. Chronic increased interstitial markings in both lungs, stable since 2019. Chronic right lower lobe collapse or consolidation has not significantly changed. No superimposed pneumothorax, pleural effusion or new pulmonary opacity. Calcified aortic atherosclerosis. No acute osseous abnormality identified. Negative visible bowel gas pattern. IMPRESSION: 1. Stable  chest with chronic right lower lobe collapse or consolidation and chronic pulmonary interstitial changes. No superimposed acute findings are identified. 2. Mild cardiomegaly.  Aortic Atherosclerosis (ICD10-I70.0). Electronically Signed   By: Genevie Ann M.D.   On: 07/21/2019 20:14    Procedures Procedures (including critical care time)  Medications Ordered in ED Medications  sodium chloride 0.9 % bolus 500 mL (0 mLs Intravenous Stopped 07/21/19 2023)     Initial Impression / Assessment and Plan / ED Course  I have reviewed the triage vital signs and the nursing notes.  Pertinent labs & imaging results that were available during my care of the patient were reviewed by me and considered in my medical decision making (see chart for details).        84 y.o. female with CHF, pAfib on eliquis, hypothyroidism, GERD, pulmonary HTN on 4LPM [place], hx of constrictive pericarditis s/p window in 2002, hyperlipidemia, htn presents with concern for syncope or near-syncope.  Denies chest pain or preceding dyspnea, has been regularly taking her eliquis, doubt PE or ACS.  Hx and exam also not consistent with SAH, AAA rupture, acute GI bleed, sepsis.  CXR without acute changes.  Normal oxygenation. Patient able to ambulate without dyspnea, dizziness or weakness.    Given history of significant straining leading to lightheadedness, history is most consistent with vasovagal episode. No signs of cardiac arrhythmia on ECG or telemetry.  Labs are significant for mild AKI, likely in setting of decreased po intake over last few days, possible contribution of looses tool this evening.  She does not appear fluid overloaded, appears more dry by hx and exam and was given 500cc [place].  Blood pressures stable in 100s in the ED, pt remains asymptomatic. Discussed syncope obs is reasonable given medical history, however history of this event is most consistent with  vasovagal episode and in this setting feel she is appropriate for close  outpatient follow up with her Cardiologist to discuss possible further outpatient evaluation.    Final Clinical Impressions(s) / ED Diagnoses   Final diagnoses:  Near syncope  Syncope and collapse  Vasovagal episode    ED Discharge Orders    None       Gareth Morgan, MD 07/22/19 1204

## 2019-07-21 NOTE — ED Notes (Signed)
Pt just returned from xray 

## 2019-07-21 NOTE — ED Triage Notes (Signed)
Pt BIB ems for near syncopal episode at home. Pt was using the bathroom and got weak, near syncope. Upon EMS arrival, pt BP 71/42, HR 52, and sob. BP now 102/60. Hx of CHF and HTN, had cataract surgery yesterday

## 2019-07-21 NOTE — ED Notes (Signed)
Pt ambulated well with oxygen set at 4 ltrs., no labored breathing. Pt stated she felt much better walking now than before. Upon checking oxygen after walk, sat at 92%

## 2019-07-22 ENCOUNTER — Other Ambulatory Visit: Payer: Self-pay | Admitting: Internal Medicine

## 2019-07-22 DIAGNOSIS — J9611 Chronic respiratory failure with hypoxia: Secondary | ICD-10-CM | POA: Diagnosis not present

## 2019-07-26 DIAGNOSIS — H401123 Primary open-angle glaucoma, left eye, severe stage: Secondary | ICD-10-CM | POA: Diagnosis not present

## 2019-07-26 DIAGNOSIS — H401112 Primary open-angle glaucoma, right eye, moderate stage: Secondary | ICD-10-CM | POA: Diagnosis not present

## 2019-08-04 ENCOUNTER — Ambulatory Visit: Payer: Medicare Other | Admitting: Endocrinology

## 2019-08-10 ENCOUNTER — Ambulatory Visit: Payer: Medicare Other | Admitting: Physician Assistant

## 2019-08-11 ENCOUNTER — Encounter (HOSPITAL_COMMUNITY): Payer: Self-pay | Admitting: Emergency Medicine

## 2019-08-11 ENCOUNTER — Inpatient Hospital Stay (HOSPITAL_COMMUNITY)
Admission: EM | Admit: 2019-08-11 | Discharge: 2019-08-22 | DRG: 260 | Disposition: E | Payer: Medicare Other | Attending: Critical Care Medicine | Admitting: Critical Care Medicine

## 2019-08-11 ENCOUNTER — Other Ambulatory Visit: Payer: Self-pay

## 2019-08-11 ENCOUNTER — Emergency Department (HOSPITAL_COMMUNITY): Payer: Medicare Other

## 2019-08-11 ENCOUNTER — Encounter (HOSPITAL_COMMUNITY): Admission: EM | Disposition: E | Payer: Self-pay | Source: Home / Self Care | Attending: Critical Care Medicine

## 2019-08-11 DIAGNOSIS — R570 Cardiogenic shock: Secondary | ICD-10-CM

## 2019-08-11 DIAGNOSIS — N179 Acute kidney failure, unspecified: Secondary | ICD-10-CM | POA: Diagnosis not present

## 2019-08-11 DIAGNOSIS — D649 Anemia, unspecified: Secondary | ICD-10-CM | POA: Diagnosis not present

## 2019-08-11 DIAGNOSIS — R402312 Coma scale, best motor response, none, at arrival to emergency department: Secondary | ICD-10-CM | POA: Diagnosis present

## 2019-08-11 DIAGNOSIS — R4020 Unspecified coma: Secondary | ICD-10-CM | POA: Diagnosis not present

## 2019-08-11 DIAGNOSIS — E162 Hypoglycemia, unspecified: Secondary | ICD-10-CM | POA: Diagnosis not present

## 2019-08-11 DIAGNOSIS — I071 Rheumatic tricuspid insufficiency: Secondary | ICD-10-CM | POA: Diagnosis present

## 2019-08-11 DIAGNOSIS — R402212 Coma scale, best verbal response, none, at arrival to emergency department: Secondary | ICD-10-CM | POA: Diagnosis not present

## 2019-08-11 DIAGNOSIS — J9622 Acute and chronic respiratory failure with hypercapnia: Secondary | ICD-10-CM | POA: Diagnosis present

## 2019-08-11 DIAGNOSIS — Z01818 Encounter for other preprocedural examination: Secondary | ICD-10-CM

## 2019-08-11 DIAGNOSIS — R001 Bradycardia, unspecified: Secondary | ICD-10-CM | POA: Diagnosis not present

## 2019-08-11 DIAGNOSIS — Z79899 Other long term (current) drug therapy: Secondary | ICD-10-CM

## 2019-08-11 DIAGNOSIS — E042 Nontoxic multinodular goiter: Secondary | ICD-10-CM | POA: Diagnosis present

## 2019-08-11 DIAGNOSIS — I5032 Chronic diastolic (congestive) heart failure: Secondary | ICD-10-CM | POA: Diagnosis present

## 2019-08-11 DIAGNOSIS — Z66 Do not resuscitate: Secondary | ICD-10-CM | POA: Diagnosis not present

## 2019-08-11 DIAGNOSIS — R55 Syncope and collapse: Secondary | ICD-10-CM | POA: Diagnosis present

## 2019-08-11 DIAGNOSIS — H409 Unspecified glaucoma: Secondary | ICD-10-CM | POA: Diagnosis not present

## 2019-08-11 DIAGNOSIS — I480 Paroxysmal atrial fibrillation: Secondary | ICD-10-CM | POA: Diagnosis not present

## 2019-08-11 DIAGNOSIS — I959 Hypotension, unspecified: Secondary | ICD-10-CM | POA: Diagnosis not present

## 2019-08-11 DIAGNOSIS — I495 Sick sinus syndrome: Secondary | ICD-10-CM | POA: Diagnosis not present

## 2019-08-11 DIAGNOSIS — Z87891 Personal history of nicotine dependence: Secondary | ICD-10-CM

## 2019-08-11 DIAGNOSIS — E785 Hyperlipidemia, unspecified: Secondary | ICD-10-CM | POA: Diagnosis present

## 2019-08-11 DIAGNOSIS — D539 Nutritional anemia, unspecified: Secondary | ICD-10-CM | POA: Diagnosis present

## 2019-08-11 DIAGNOSIS — D689 Coagulation defect, unspecified: Secondary | ICD-10-CM | POA: Diagnosis not present

## 2019-08-11 DIAGNOSIS — G9341 Metabolic encephalopathy: Secondary | ICD-10-CM | POA: Diagnosis not present

## 2019-08-11 DIAGNOSIS — I311 Chronic constrictive pericarditis: Secondary | ICD-10-CM | POA: Diagnosis not present

## 2019-08-11 DIAGNOSIS — R0689 Other abnormalities of breathing: Secondary | ICD-10-CM | POA: Diagnosis not present

## 2019-08-11 DIAGNOSIS — Z4682 Encounter for fitting and adjustment of non-vascular catheter: Secondary | ICD-10-CM | POA: Diagnosis not present

## 2019-08-11 DIAGNOSIS — E872 Acidosis: Secondary | ICD-10-CM | POA: Diagnosis present

## 2019-08-11 DIAGNOSIS — I469 Cardiac arrest, cause unspecified: Secondary | ICD-10-CM | POA: Diagnosis present

## 2019-08-11 DIAGNOSIS — E039 Hypothyroidism, unspecified: Secondary | ICD-10-CM | POA: Diagnosis not present

## 2019-08-11 DIAGNOSIS — J9621 Acute and chronic respiratory failure with hypoxia: Secondary | ICD-10-CM | POA: Diagnosis not present

## 2019-08-11 DIAGNOSIS — K5900 Constipation, unspecified: Secondary | ICD-10-CM | POA: Diagnosis present

## 2019-08-11 DIAGNOSIS — E0520 Thyrotoxicosis with toxic multinodular goiter without thyrotoxic crisis or storm: Secondary | ICD-10-CM | POA: Diagnosis present

## 2019-08-11 DIAGNOSIS — R402112 Coma scale, eyes open, never, at arrival to emergency department: Secondary | ICD-10-CM | POA: Diagnosis not present

## 2019-08-11 DIAGNOSIS — R531 Weakness: Secondary | ICD-10-CM | POA: Diagnosis not present

## 2019-08-11 DIAGNOSIS — Z7901 Long term (current) use of anticoagulants: Secondary | ICD-10-CM

## 2019-08-11 DIAGNOSIS — Z20828 Contact with and (suspected) exposure to other viral communicable diseases: Secondary | ICD-10-CM | POA: Diagnosis present

## 2019-08-11 DIAGNOSIS — Z515 Encounter for palliative care: Secondary | ICD-10-CM | POA: Diagnosis not present

## 2019-08-11 DIAGNOSIS — I272 Pulmonary hypertension, unspecified: Secondary | ICD-10-CM | POA: Diagnosis present

## 2019-08-11 DIAGNOSIS — Z452 Encounter for adjustment and management of vascular access device: Secondary | ICD-10-CM | POA: Diagnosis not present

## 2019-08-11 DIAGNOSIS — I110 Hypertensive heart disease with heart failure: Secondary | ICD-10-CM | POA: Diagnosis present

## 2019-08-11 DIAGNOSIS — Z7952 Long term (current) use of systemic steroids: Secondary | ICD-10-CM

## 2019-08-11 DIAGNOSIS — J811 Chronic pulmonary edema: Secondary | ICD-10-CM | POA: Diagnosis not present

## 2019-08-11 DIAGNOSIS — I472 Ventricular tachycardia: Secondary | ICD-10-CM | POA: Diagnosis present

## 2019-08-11 DIAGNOSIS — R579 Shock, unspecified: Secondary | ICD-10-CM | POA: Diagnosis not present

## 2019-08-11 DIAGNOSIS — I442 Atrioventricular block, complete: Secondary | ICD-10-CM | POA: Diagnosis not present

## 2019-08-11 DIAGNOSIS — Z03818 Encounter for observation for suspected exposure to other biological agents ruled out: Secondary | ICD-10-CM | POA: Diagnosis not present

## 2019-08-11 DIAGNOSIS — E161 Other hypoglycemia: Secondary | ICD-10-CM | POA: Diagnosis not present

## 2019-08-11 HISTORY — PX: TEMPORARY PACEMAKER: CATH118268

## 2019-08-11 LAB — COMPREHENSIVE METABOLIC PANEL
ALT: 37 U/L (ref 0–44)
AST: 133 U/L — ABNORMAL HIGH (ref 15–41)
Albumin: 2.7 g/dL — ABNORMAL LOW (ref 3.5–5.0)
Alkaline Phosphatase: 112 U/L (ref 38–126)
Anion gap: 20 — ABNORMAL HIGH (ref 5–15)
BUN: 36 mg/dL — ABNORMAL HIGH (ref 8–23)
CO2: 10 mmol/L — ABNORMAL LOW (ref 22–32)
Calcium: 9 mg/dL (ref 8.9–10.3)
Chloride: 102 mmol/L (ref 98–111)
Creatinine, Ser: 2.52 mg/dL — ABNORMAL HIGH (ref 0.44–1.00)
GFR calc Af Amer: 20 mL/min — ABNORMAL LOW (ref >60)
GFR calc non Af Amer: 17 mL/min — ABNORMAL LOW (ref >60)
Glucose, Bld: 63 mg/dL — ABNORMAL LOW (ref 70–99)
Potassium: 4.9 mmol/L (ref 3.5–5.1)
Sodium: 132 mmol/L — ABNORMAL LOW (ref 135–145)
Total Bilirubin: 7 mg/dL — ABNORMAL HIGH (ref 0.3–1.2)
Total Protein: 6.9 g/dL (ref 6.5–8.1)

## 2019-08-11 LAB — PREPARE RBC (CROSSMATCH)

## 2019-08-11 LAB — RETICULOCYTES
Immature Retic Fract: 34.3 % — ABNORMAL HIGH (ref 2.3–15.9)
RBC.: 3.84 MIL/uL — ABNORMAL LOW (ref 3.87–5.11)
Retic Count, Absolute: 148.6 10*3/uL (ref 19.0–186.0)
Retic Ct Pct: 3.9 % — ABNORMAL HIGH (ref 0.4–3.1)

## 2019-08-11 LAB — POCT I-STAT 7, (LYTES, BLD GAS, ICA,H+H)
Acid-base deficit: 19 mmol/L — ABNORMAL HIGH (ref 0.0–2.0)
Bicarbonate: 7.6 mmol/L — ABNORMAL LOW (ref 20.0–28.0)
Calcium, Ion: 1.1 mmol/L — ABNORMAL LOW (ref 1.15–1.40)
HCT: 46 % (ref 36.0–46.0)
Hemoglobin: 15.6 g/dL — ABNORMAL HIGH (ref 12.0–15.0)
O2 Saturation: 91 %
Patient temperature: 92.9
Potassium: 3.7 mmol/L (ref 3.5–5.1)
Sodium: 130 mmol/L — ABNORMAL LOW (ref 135–145)
TCO2: 8 mmol/L — ABNORMAL LOW (ref 22–32)
pCO2 arterial: 18.1 mmHg — CL (ref 32.0–48.0)
pH, Arterial: 7.214 — ABNORMAL LOW (ref 7.350–7.450)
pO2, Arterial: 60 mmHg — ABNORMAL LOW (ref 83.0–108.0)

## 2019-08-11 LAB — CBC
HCT: 18.4 % — ABNORMAL LOW (ref 36.0–46.0)
Hemoglobin: 5.8 g/dL — CL (ref 12.0–15.0)
MCH: 36.5 pg — ABNORMAL HIGH (ref 26.0–34.0)
MCHC: 31.5 g/dL (ref 30.0–36.0)
MCV: 115.7 fL — ABNORMAL HIGH (ref 80.0–100.0)
Platelets: 51 10*3/uL — ABNORMAL LOW (ref 150–400)
RBC: 1.59 MIL/uL — ABNORMAL LOW (ref 3.87–5.11)
RDW: 18 % — ABNORMAL HIGH (ref 11.5–15.5)
WBC: 3.1 10*3/uL — ABNORMAL LOW (ref 4.0–10.5)
nRBC: 0.6 % — ABNORMAL HIGH (ref 0.0–0.2)

## 2019-08-11 LAB — ABO/RH: ABO/RH(D): B POS

## 2019-08-11 LAB — IRON AND TIBC
Iron: 99 ug/dL (ref 28–170)
Saturation Ratios: 33 % — ABNORMAL HIGH (ref 10.4–31.8)
TIBC: 302 ug/dL (ref 250–450)
UIBC: 203 ug/dL

## 2019-08-11 LAB — FERRITIN: Ferritin: 182 ng/mL (ref 11–307)

## 2019-08-11 LAB — VITAMIN B12: Vitamin B-12: 3969 pg/mL — ABNORMAL HIGH (ref 180–914)

## 2019-08-11 LAB — SARS CORONAVIRUS 2 BY RT PCR (HOSPITAL ORDER, PERFORMED IN ~~LOC~~ HOSPITAL LAB): SARS Coronavirus 2: NEGATIVE

## 2019-08-11 LAB — APTT: aPTT: 87 seconds — ABNORMAL HIGH (ref 24–36)

## 2019-08-11 LAB — T4, FREE: Free T4: 3.35 ng/dL — ABNORMAL HIGH (ref 0.61–1.12)

## 2019-08-11 LAB — PROTIME-INR
INR: >10 (ref 0.8–1.2)
Prothrombin Time: >90 seconds — ABNORMAL HIGH (ref 11.4–15.2)

## 2019-08-11 LAB — TSH
TSH: 0.239 u[IU]/mL — ABNORMAL LOW (ref 0.350–4.500)
TSH: 0.655 u[IU]/mL (ref 0.350–4.500)

## 2019-08-11 LAB — LACTIC ACID, PLASMA: Lactic Acid, Venous: 4.3 mmol/L (ref 0.5–1.9)

## 2019-08-11 LAB — POC OCCULT BLOOD, ED: Fecal Occult Bld: POSITIVE — AB

## 2019-08-11 SURGERY — TEMPORARY PACEMAKER
Anesthesia: LOCAL

## 2019-08-11 MED ORDER — HEPARIN (PORCINE) IN NACL 1000-0.9 UT/500ML-% IV SOLN
INTRAVENOUS | Status: DC | PRN
  Administered 2019-08-11: 500 mL

## 2019-08-11 MED ORDER — GLUCAGON HCL RDNA (DIAGNOSTIC) 1 MG IJ SOLR
3.0000 mg/h | INTRAVENOUS | Status: DC
  Administered 2019-08-12 (×3): 3 mg/h via INTRAVENOUS
  Filled 2019-08-11 (×5): qty 5

## 2019-08-11 MED ORDER — PANTOPRAZOLE SODIUM 40 MG IV SOLR
40.0000 mg | Freq: Two times a day (BID) | INTRAVENOUS | Status: DC
  Administered 2019-08-12: 40 mg via INTRAVENOUS
  Filled 2019-08-11: qty 40

## 2019-08-11 MED ORDER — FENTANYL BOLUS VIA INFUSION
25.0000 ug | INTRAVENOUS | Status: DC | PRN
  Filled 2019-08-11: qty 25

## 2019-08-11 MED ORDER — ATROPINE SULFATE 1 MG/ML IJ SOLN
INTRAMUSCULAR | Status: AC | PRN
  Administered 2019-08-11 (×2): .5 mg via INTRAVENOUS

## 2019-08-11 MED ORDER — FENTANYL CITRATE (PF) 100 MCG/2ML IJ SOLN: 25.0000 ug | INTRAMUSCULAR | Status: DC | PRN

## 2019-08-11 MED ORDER — VANCOMYCIN VARIABLE DOSE PER UNSTABLE RENAL FUNCTION (PHARMACIST DOSING): Status: DC

## 2019-08-11 MED ORDER — MIDAZOLAM 50MG/50ML (1MG/ML) PREMIX INFUSION
0.0000 mg/h | INTRAVENOUS | Status: DC
  Administered 2019-08-11 (×2): 2 mg/h via INTRAVENOUS
  Filled 2019-08-11: qty 50

## 2019-08-11 MED ORDER — SODIUM CHLORIDE 0.9% FLUSH: 3.0000 mL | Freq: Once | INTRAVENOUS | Status: DC

## 2019-08-11 MED ORDER — ROCURONIUM BROMIDE 50 MG/5ML IV SOLN
INTRAVENOUS | Status: AC | PRN
  Administered 2019-08-11: 70 mg via INTRAVENOUS

## 2019-08-11 MED ORDER — NOREPINEPHRINE 4 MG/250ML-% IV SOLN
0.0000 ug/min | INTRAVENOUS | Status: DC
  Administered 2019-08-11: 5 ug/min via INTRAVENOUS

## 2019-08-11 MED ORDER — METRONIDAZOLE IN NACL 5-0.79 MG/ML-% IV SOLN
500.0000 mg | Freq: Once | INTRAVENOUS | Status: DC
  Administered 2019-08-11: 500 mg via INTRAVENOUS
  Filled 2019-08-11: qty 100

## 2019-08-11 MED ORDER — FENTANYL CITRATE (PF) 100 MCG/2ML IJ SOLN
25.0000 ug | Freq: Once | INTRAMUSCULAR | Status: AC
  Administered 2019-08-11: 25 ug via INTRAVENOUS

## 2019-08-11 MED ORDER — HEPARIN (PORCINE) IN NACL 1000-0.9 UT/500ML-% IV SOLN
INTRAVENOUS | Status: AC
  Filled 2019-08-11: qty 500

## 2019-08-11 MED ORDER — SODIUM CHLORIDE 0.9 % IV SOLN
2.0000 g | Freq: Once | INTRAVENOUS | Status: AC
  Administered 2019-08-11: 2 g via INTRAVENOUS
  Filled 2019-08-11: qty 2

## 2019-08-11 MED ORDER — LIDOCAINE HCL (PF) 1 % IJ SOLN
INTRAMUSCULAR | Status: AC
  Filled 2019-08-11: qty 30

## 2019-08-11 MED ORDER — GLUCAGON HCL RDNA (DIAGNOSTIC) 1 MG IJ SOLR
5.0000 mg | Freq: Once | INTRAVENOUS | Status: DC
  Filled 2019-08-11: qty 5

## 2019-08-11 MED ORDER — PANTOPRAZOLE SODIUM 40 MG IV SOLR: 40.0000 mg | Freq: Every day | INTRAVENOUS | Status: DC

## 2019-08-11 MED ORDER — SODIUM CHLORIDE 0.9 % IV SOLN
1.0000 g | INTRAVENOUS | Status: DC
  Filled 2019-08-11: qty 1

## 2019-08-11 MED ORDER — VANCOMYCIN HCL 10 G IV SOLR
1750.0000 mg | Freq: Once | INTRAVENOUS | Status: DC
  Filled 2019-08-11: qty 1750

## 2019-08-11 MED ORDER — EPINEPHRINE 1 MG/10ML IJ SOSY
PREFILLED_SYRINGE | INTRAMUSCULAR | Status: AC | PRN
  Administered 2019-08-11: 1 mg via INTRAVENOUS

## 2019-08-11 MED ORDER — VANCOMYCIN HCL IN DEXTROSE 1-5 GM/200ML-% IV SOLN: 1000.0000 mg | Freq: Once | INTRAVENOUS | Status: DC

## 2019-08-11 MED ORDER — SODIUM CHLORIDE 0.9% IV SOLUTION
Freq: Once | INTRAVENOUS | Status: AC
  Administered 2019-08-12: 02:00:00 via INTRAVENOUS

## 2019-08-11 MED ORDER — LIDOCAINE HCL (PF) 1 % IJ SOLN
INTRAMUSCULAR | Status: DC | PRN
  Administered 2019-08-11: 15 mL via INTRADERMAL

## 2019-08-11 MED ORDER — SODIUM CHLORIDE 0.9 % IV SOLN: 10.0000 mL/h | Freq: Once | INTRAVENOUS | Status: DC

## 2019-08-11 MED ORDER — EPINEPHRINE HCL 5 MG/250ML IV SOLN IN NS
0.5000 ug/min | INTRAVENOUS | Status: DC
  Administered 2019-08-11: 5 ug/min via INTRAVENOUS
  Administered 2019-08-12: 25 ug/min via INTRAVENOUS
  Administered 2019-08-12: 20 ug/min via INTRAVENOUS
  Administered 2019-08-12: 25 ug/min via INTRAVENOUS
  Administered 2019-08-12: 20 ug/min via INTRAVENOUS
  Filled 2019-08-11 (×6): qty 250

## 2019-08-11 MED ORDER — MIDAZOLAM BOLUS VIA INFUSION
1.0000 mg | INTRAVENOUS | Status: DC | PRN
  Filled 2019-08-11: qty 2

## 2019-08-11 MED ORDER — ETOMIDATE 2 MG/ML IV SOLN
INTRAVENOUS | Status: AC | PRN
  Administered 2019-08-11: 20 mg via INTRAVENOUS

## 2019-08-11 MED ORDER — FENTANYL 2500MCG IN NS 250ML (10MCG/ML) PREMIX INFUSION
25.0000 ug/h | INTRAVENOUS | Status: DC
  Administered 2019-08-11: 25 ug/h via INTRAVENOUS
  Filled 2019-08-11: qty 250

## 2019-08-11 SURGICAL SUPPLY — 5 items
CABLE ADAPT PACING TEMP 12FT (ADAPTER) ×1 IMPLANT
CATH S G BIP PACING (CATHETERS) ×1 IMPLANT
PACK CARDIAC CATHETERIZATION (CUSTOM PROCEDURE TRAY) ×2 IMPLANT
SHEATH PINNACLE 6F 10CM (SHEATH) ×1 IMPLANT
SLEEVE REPOSITIONING LENGTH 30 (MISCELLANEOUS) ×1 IMPLANT

## 2019-08-11 NOTE — Code Documentation (Signed)
Pr shocked second time by critical care MD.

## 2019-08-11 NOTE — ED Notes (Signed)
CBC resulted  By lab prior to blood transfusion hg 5.8. ABG shows hg 15 per Dr. Kathrynn Humble ok to transfuse one unit of blood that is ready, order to repeat a CBC to verify first result.

## 2019-08-11 NOTE — Code Documentation (Signed)
Pt shocked by critical care MD.

## 2019-08-11 NOTE — CV Procedure (Signed)
   Placement of emergency transvenous right ventricular temporary pacemaker using real-time vascular ultrasound for femoral vein access.  A 5 French bipolar temporary balloontipped pacing wire was advanced to the right ventricle.  Pulmonary hypertension and severe tricuspid regurgitation prevented optimal positioning of the pacer in the right ventricle.  A relatively large loop was created in the atrium and seemed to produce a stable ventricular position.  Threshold was for and the pacer was left at an MA of 8 and a rate of 70.  A single anterior wall stick was performed.  No complications to this point.  The pacemaker catheter was sewn in place.  She is at relatively high risk for losing capture if there is much movement in the pacemaker wire.

## 2019-08-11 NOTE — Code Documentation (Signed)
Pt to be intubated by [place].

## 2019-08-11 NOTE — ED Notes (Signed)
Transcutaneous pacing started by [employer] on the ER. Pacing on the 80.

## 2019-08-11 NOTE — ED Notes (Signed)
Intubation completed

## 2019-08-11 NOTE — Code Documentation (Signed)
Pt HR 56 after atropine IV given, pt is on complete heart block.

## 2019-08-11 NOTE — Consult Note (Addendum)
Cardiology Consult    Patient ID: Monica Mora MRN: [id number], DOB/AGE: [birth year]   Admit date: 08/01/2019 Date of Consult: 07/28/2019  Primary Physician: Biagio Borg, MD Primary Cardiologist: Dorris Carnes, MD Requesting Provider: Gareth Morgan, MD  Patient Profile    Monica Mora is a 84 y.o. female with a history of paroxysmal AF on Eliquis, ILD with chronic hypoxic respiratory failure on 4L, likely invasive pulmonary adenocarcinoma (under evaluation at [facility]), severe PH, hypothyroidism from prior RI, and constrictive pericarditis (treated surgically). She is being seen today for the evaluation of bradycardia.   History of Present Illness    History obtained from ED providers and the patients niece (HCPOA). Despite her multiple chronic comorbidities her niece states that she has been getting along well at home by herself, they speak daily.  She has been dealing with constipation and has been feeling more cold than usual for the last couple of months. The patient is able to list all of her medications by name and is strictly adherent to them as prescribed. Her neighbors also help to keep an eye on her. Of note, she was previously on prednisone for possible BOOP, but not felt to be consistent with this diagnosis at [facility] so weaned off steroids a few weeks ago.   Yesterday she was on the phone with family and mentioned just feeling generally weak. This morning she was about the same but later that day they checked in again (over the phone) and was noted to be less coherent and feeling even more weak. The patient clearly stated that she had not felt well and did not take any of her medications today. Her neighbors called to check in after noticing she hadn't opened her blinds as she usually does and noticed the same. They eventually called 911. She was on her phone with her niece when the paramedics arrived and was actually able to make it to the door to let them in the house (she  was not actually unresponsive on EMS arrival). Was very lethargic with EMS on scene, blood glucose 30, received D50. Initial heart rate was 50 on scene. En route to the hospital her rate was as low as 30 and transcutaneous pacing was initiated and Versed given.   When she arrived to the ED, GCS was reported to be 3 and she was promptly intubated. I personally reviewed all strips and telemetry which all show sinus bradycardia. At no point was there any evidence of AV block. She was cold and hypothermic to 93 F rectal. She was started on epinephrine at 5 mcg/min. She did become hypotensive with pacing paused, underlying rhythm sinus bradycardia with HR around 40. Labs (on vent and epi) were significant for ABG 7.21/18/60, bicarb 10, AG 20, lactate 4.3, Cr 2.52, glucose 63, Hgb 5.8, Plts 51, INR > 10, Tbili 7, FT4 3.35, TSH 0.239. Rectal exam negative for blood.  When I saw her she was not capturing and required 120 mV for mechanical capture. Pads replaced and repositioned with good electro-mechanical capture at 75 mV. Blood pressure improved from 33I to 95J systolic with adequate capture and pacing at 80 bpm. CCM was consulted as well and arrived in the ED. Decision was made for transvenous pacing. While awaiting transport to the cath lab, Zoll monitor died and she had a brief period of profound bradycardia for about 30 seconds, though did have a pulse. Very brief compressions performed before Zoll exchanged and pacing resumed - did not actually arrest.  During this time CCM pushed 1mg  epi bolus resulting in VT - I was able to perform synchronized cardioversion back to sinus brady but required 3 shocks up to 200 J. Her heart rate improved to the 80s with the epi and maintained a good rate all the way to the cath lab. Dr. Tamala Julian placed TVP with some difficulty in securing a stable position due to the severity of her PH, TR, RV dilation. She was transported to the ICU with good capture.    Past Medical History    Past Medical History:  Diagnosis Date   Abnormal chest x-ray    Atrial fibrillation (HCC)    CHF (congestive heart failure) (HCC)    Constrictive pericarditis    s/p pericardial window in 2002 in [place]   Glaucoma    H/O: hysterectomy 1992   Heart murmur    Hyperlipidemia    Nontoxic multinodular goiter    Other voice and resonance disorders    TB (tuberculosis) 2002   positive skin test, negative chest x-ray   Unspecified essential hypertension    Unspecified hypothyroidism     Past Surgical History:  Procedure Laterality Date   ABDOMINAL HYSTERECTOMY  1992   PERICARDIAL WINDOW  2002   Radioactive Iodine for hyperthyroidism/Multinodular goiter     s/p   VIDEO BRONCHOSCOPY Bilateral 01/21/2018   Procedure: VIDEO BRONCHOSCOPY WITH FLUORO;  Surgeon: Tanda Rockers, MD;  Location: [facility] END[facility]COPY;  Service: Cardiopulmonary;  Laterality: Bilateral;   VIDEO BRONCHOSCOPY WITH ENDOBRONCHIAL NAVIGATION Right 02/11/2018   Procedure: VIDEO BRONCHOSCOPY WITH ENDOBRONCHIAL NAVIGATION;  Surgeon: Collene Gobble, MD;  Location: [facility] OR;  Service: Thoracic;  Laterality: Right;   VIDEO BRONCHOSCOPY WITH ENDOBRONCHIAL ULTRASOUND Right 02/11/2018   Procedure: VIDEO BRONCHOSCOPY WITH ENDOBRONCHIAL ULTRASOUND;  Surgeon: Collene Gobble, MD;  Location: [facility] OR;  Service: Thoracic;  Laterality: Right;     No Known Allergies Inpatient Medications     [MAR Hold] pantoprazole (PROTONIX) IV  40 mg Intravenous QHS   [MAR Hold] sodium chloride flush  3 mL Intravenous Once   vancomycin variable dose per unstable renal function (pharmacist dosing)   Does not apply See admin instructions    Family History    Family History  Problem Relation Age of Onset   Asthma Brother    Arthritis Mother    Arthritis Father    Cervical cancer Sister    Cancer Sister        Cervical Cancer   She indicated that her mother is deceased. She indicated that her father is deceased. She  indicated that the status of her sister is unknown. She indicated that the status of her brother is unknown. She indicated that her maternal grandmother is deceased. She indicated that her maternal grandfather is deceased. She indicated that her paternal grandmother is deceased. She indicated that her paternal grandfather is deceased.   Social History    Social History   Socioeconomic History   Marital status: Single    Spouse name: Not on file   Number of children: 0   Years of education: Not on file   Highest education level: Not on file  Occupational History   Occupation: Retired    Comment: office work    Fish farm manager: RETIRED  Scientist, product/process development strain: Not hard at [employer]    Worry: Never Monica    Inability: Never Monica   Transportation needs    Medical: No    Non-medical: No  Tobacco Use   Smoking status: Former Smoker    Packs/day: 0.50    Years: 21.00    Pack years: 10.50    Types: Cigarettes    Quit date: 10/21/1978    Years since quitting: 40.8   Smokeless tobacco: Never Used  Substance and Sexual Activity   Alcohol use: No   Drug use: No   Sexual activity: Not Currently  Lifestyle   Physical activity    Days per week: 0 days    Minutes per session: 0 min   Stress: Only a little  Relationships   Press photographer on phone: Not on file    Gets together: Not on file    Attends religious service: Not on file    Active member of club or organization: Not on file    Attends meetings of clubs or organizations: Not on file    Relationship status: Never married   Intimate partner violence    Fear of current or ex partner: Not on file    Emotionally abused: Not on file    Physically abused: Not on file    Forced sexual activity: Not on file  Other Topics Concern   Not on file  Social History Narrative   Daily Caffeine Use: 1 daily    Juncaj is single, retired (office work, worked at a prison) and lives  alone but has assist from her Niece, Belenda Cruise (caled Clinton Ambler) and great niece, Jasmine (RN at [employer])) She is independent/assist with her care needs. She reports having difficulty with seeing     She is able to drive to medical appointments, to the bank, grocery store or pharmacy independently locally.     Review of Systems    Unable to obtain due to patient condition   Physical Exam    Blood pressure (!) 137/117, pulse (!) 160, temperature (!) 92.9 F (33.8 C), temperature source Rectal, resp. rate 12, height 5\' 6"  (1.676 m), weight 82.2 kg, SpO2 100 %.  Vent Mode: PRVC FiO2 (%):  [100 %] 100 % Set Rate:  [16 bmp] 16 bmp Vt Set:  [450 mL] 450 mL PEEP:  [8 cmH20] 8 cmH20 Plateau Pressure:  [18 cmH20] 18 cmH20  Intake/Output Summary (Last 24 hours) at 08/10/2019 2302 Last data filed at 08/02/2019 2142 Gross per 24 hour  Intake 635.1 ml  Output --  Net 635.1 ml   Wt Readings from Last 3 Encounters:  08/06/2019 82.2 kg  05/18/19 82.2 kg  01/07/19 92.1 kg    CONSTITUTIONAL: intubated, RAAS -5, ill appearing, Bair Hugger in place HEENT: ETT in place. Pupils equal and sluggish. NECK: EJ vein is distended, unable to assess JVP CARDIOVASCULAR: Regular rhythm. Limited by pacing pad and ventilator sounds, though no gallop, murmur, or rub appreciable. VASCULAR: 2+ bilateral femoral pulses once adequate capture obtained. Radial pulses intact.  PULMONARY/CHEST WALL: no deformities, mechanical breath sounds, synchronous with vent ABDOMINAL: soft, non-tender, non-distended EXTREMITIES: 1+ edema.  SKIN: Cold diffusely prior to active warming. Dry and intact without apparent rashes or wounds. NEUROLOGIC: Sedated to RAAS -5, no abnormal movements, pupils equal and sluggish.   Labs    Troponin (Point of Care Test) No results for input(s): TROPIPOC in the last 72 hours. No results for input(s): CKTOTAL, CKMB, TROPONINI in the last 72 hours. Lab Results  Component Value Date    WBC 3.1 (L) 08/08/2019   HGB 15.6 (H) 07/28/2019   HCT 46.0 08/21/2019   MCV 115.7 (H) 07/31/2019  PLT 51 (L) 08/05/2019    Recent Labs  Lab 08/01/2019 2124  NA 132*  K 4.9  CL 102  CO2 10*  BUN 36*  CREATININE 2.52*  CALCIUM 9.0  PROT 6.9  BILITOT 7.0*  ALKPHOS 112  ALT 37  AST 133*  GLUCOSE 63*   Lab Results  Component Value Date   CHOL 120 03/30/2019   HDL 25.10 (L) 03/30/2019   LDLCALC 78 03/30/2019   TRIG 83.0 03/30/2019     Radiology Studies    Dg Chest 2 View  Result Date: 07/21/2019 CLINICAL DATA:  84 year old female with shortness of breath and near syncope. EXAM: CHEST - 2 VIEW COMPARISON:  04/29/2018 chest radiographs. Chest CT 06/16/2018. FINDINGS: Upright AP and lateral views at 1946 hours. Stable cardiomegaly and mediastinal contours. Stable lung volumes. Chronic increased interstitial markings in both lungs, stable since 2019. Chronic right lower lobe collapse or consolidation has not significantly changed. No superimposed pneumothorax, pleural effusion or new pulmonary opacity. Calcified aortic atherosclerosis. No acute osseous abnormality identified. Negative visible bowel gas pattern. IMPRESSION: 1. Stable chest with chronic right lower lobe collapse or consolidation and chronic pulmonary interstitial changes. No superimposed acute findings are identified. 2. Mild cardiomegaly.  Aortic Atherosclerosis (ICD10-I70.0). Electronically Signed   By: Genevie Ann M.D.   On: 07/21/2019 20:14   Dg Chest Port 1 View  Result Date: 07/25/2019 CLINICAL DATA:  Repositioning of endotracheal tube. EXAM: PORTABLE CHEST 1 VIEW 9:49 p.m. COMPARISON:  08/01/2019 at 8:26 p.m. FINDINGS: Endotracheal tube tip is now 4.5 cm above the carina in good position. NG tube tip is below the diaphragm. Cardiomegaly. Mild interstitial edema, essentially unchanged. No discrete effusion. Aortic atherosclerosis. IMPRESSION: 1. Endotracheal tube tip is 4.5 cm above the carina, in good position. 2.  Stable mild interstitial edema. 3. Aortic atherosclerosis. Electronically Signed   By: Lorriane Shire M.D.   On: 07/30/2019 22:02   Dg Chest Port 1 View  Result Date: 08/18/2019 CLINICAL DATA:  Intubation EXAM: PORTABLE CHEST 1 VIEW COMPARISON:  July 21, 2019 FINDINGS: Endotracheal tube is just entering the right mainstem bronchus. NG tube is seen coursing below the diaphragm. There is coarsened interstitial markings throughout both lungs. There is marked cardiomegaly. Aortic knob calcifications. IMPRESSION: Endotracheal tube tip just entering the right mainstem bronchus and could be retracted approximately 2 cm. Cardiomegaly and interstitial edema. These results were called by telephone at the time of interpretation on 08/07/2019 at 8:59 pm to provider [facility] , who verbally acknowledged these results. Electronically Signed   By: Prudencio Pair M.D.   On: 08/17/2019 20:59    ECG & Cardiac Imaging    ECG: Sinus bradycardia with sinus arrhythmia, right axis deviation, nonspecific t-wave abnormality - personally reviewed.   [place] 12/2018, [facility]: RA 14/12 with a mean of 10 RV 88/16 PA 87/30 with a mean of 51 PC wedge 8/16 with a mean of 9 Aortic saturation by pulse oximetry was 89% with the patient on 2 L of nasal O2. Measured PA saturation was 47.4. Fick cardiac output was 2.22 with an index of 1.1 (on supplemental oxygen).   TTE 12/18/2018, [facility]: There is normal left ventricular systolic function. The EF is estimated at 50-55%. The right ventricular cavity size is moderately enlarged. The right ventricular global systolic function is moderately reduced. The right ventricular systolic pressure is estimated to be 65-83mmHg.  Mild AI, mild MR, moderate TR No pericardial effusion  TTE 02/18/2018, [facility]: - Left ventricle: The cavity size was  normal. Wall thickness was   normal. Systolic function was normal. The estimated ejection   fraction was in the range of 55% to 60%. Wall  motion was normal;   there were no regional wall motion abnormalities. Doppler   parameters are consistent with abnormal left ventricular   relaxation (grade 1 diastolic dysfunction). - Ventricular septum: D-shaped interventricular septum in diastole   and systole, suggesting RV pressure/volume overload. - Aortic valve: There was no stenosis. - Mitral valve: Mildly calcified annulus. There was no significant   regurgitation. - Right ventricle: The cavity size was moderately dilated. Systolic   function was mildly reduced. - Right atrium: The atrium was mildly to moderately dilated. - Tricuspid valve: Peak RV-RA gradient (S): 87 mm Hg. - Pulmonary arteries: PA peak pressure: 102 mm Hg (S). - Systemic veins: IVC measured 2.6 cm with < 50% respirophasic   variation, suggesting RA pressure 15 mmHg.   Assessment & Plan    Sinus bradycardia: Multifactorial, secondary to hypothermia, metabolic derangements, beta blocker (though low dose), hypoxia. Myxedema ruled out given normal/elevated T4 (TSH suppressed early in critical illness). Responsive to epinephrine and has improved with warming as well, however does appear to need pacing support for now as contributing etiologies are addressed. Based on history obtained from niece who knows the patient very well, I think an accidental beta blocker overdose is unlikely. Note that she has intact AV conduction - there has been no evidence of AV block.   - TVP placed emergently, output 9mA (threshold 4.30mA), rate at 70 bpm - can increase rate if needed. - Keep external pacing pads in place - Continue epinephrine infusion, would avoid any further bolus doses - Continue warming, treatment of acidosis, respiratory failure, etc. - Avoid any negative chronotrops (beta blockers, etc)  Severe PH with low output RV failure and restrictive LV physiology: Likely contributing to shock, however this is not by any means a new diagnosis and unlikely to be the primary  etiology. She was planned for initiation of PH therapy at [facility] but has not yet been seen for this. Etiology possibly related to ILD, though unclear. Serologic work-up was only positive for ANA.  - Start inhaled epoprostanol - Agree with epinephrine for now, particularly with concomitant bradycardia - Will defer placement of PA catheter due to tenuous pacing lead position - Would hold off on diuresis for now given question of hemorrhage (hgb < 6)  - Complete TTE tomorrow  Paroxysmal AF: currently sinus rhythm, on Eliquis and metoprolol chronically - Hold Eliquis given severe acute anemia - Hold metoprolol   Acute on chronic hypoxemic and hypercapnic respiratory failure: - CCM managing - Would add epoprostanol, as noted above  Acute renal failure and severe metabolic acidosis with elevated AG: - Started bicarb infusion - Trending ABG, femoral arterial line placed - May need CRRT though prognosis is guarded  Severe macrocytic anemia:  - Holding Eliquis - Source unclear  Coagulopathy: pt had felt that she was having adverse effects from Eliquis, unclear if she may have reinitiated warfarin on her own. Also was eating very poorly and losing weight recently. Very likely has at least some component of cardiac cirrhosis as well, though would expect more transaminase elevation if also shock liver.    Signed, Marykay Lex, MD 08/15/2019, 11:02 PM  For questions or updates, please contact   Please consult [email or web address] for contact info under Cardiology/STEMI.

## 2019-08-11 NOTE — Progress Notes (Signed)
Pharmacy Antibiotic Note  Monica Mora is a 84 y.o. female admitted on 08/18/2019 with sepsis  Pharmacy has been consulted for Vanc and Cefepime dosing.  Acute renal insufficiency Scr 2.52.  Plan: Cefepime 2 grams IV x 1 then 1 gram IV q24hr Vanc 1750 mg IV x 1, then variable dosing based on renal function and levels managed by pharmacy. Monitor renal function, clinical status, C&S and vancomycin levels.  Height: 5\' 6"  (167.6 cm) Weight: 181 lb 3.5 oz (82.2 kg) IBW/kg (Calculated) : 59.3  Temp (24hrs), Avg:93.8 F (34.3 C), Min:92.9 F (33.8 C), Max:95.6 F (35.3 C)  Recent Labs  Lab 07/24/2019 1950 07/26/2019 1958 07/25/2019 2124  WBC 3.1*  --   --   CREATININE  --   --  2.52*  LATICACIDVEN  --  4.3*  --     Estimated Creatinine Clearance: 18.6 mL/min (A) (by C-G formula based on SCr of 2.52 mg/dL (H)).    No Known Allergies  Antimicrobials this admission: Vanco 10/21 >>  Cefepime 10/21 >>   Thank you for allowing pharmacy to be a part of this patient's care.  Alanda Slim, PharmD, [facility] Clinical Pharmacist Please see AMION for all Pharmacists' Contact Phone Numbers 08/15/2019, 10:45 PM

## 2019-08-11 NOTE — ED Notes (Signed)
Jonne Ply MD at the bedside.

## 2019-08-11 NOTE — ED Notes (Signed)
Pt transferred to cath lab per ICU doctor. No more vitals sign available after this time for blood.

## 2019-08-11 NOTE — ED Notes (Signed)
Beard hugger applied.

## 2019-08-11 NOTE — ED Notes (Signed)
Pt belongings given to family member [employer] including clothing, life alert necklace, wig, upper dentures and all other belongings that patient came in with.

## 2019-08-11 NOTE — ED Triage Notes (Signed)
Pt brought to ED by [facility] from home after found her mostly unresponsive with a CBG of 30 D10 given by EMS on arrival with a HR on the 30's pt very lethargic, 2.5 Versed given by EMS prior to arrival and  Started pacing pt on the 80's pta to ED. Pt very lethargic on ED arrival no verbal no responding to voice or to pain.

## 2019-08-11 NOTE — ED Notes (Signed)
First unit of blood started per Dr. Kathrynn Humble, pt unresponsive and intubated unable to get blood consent at this time, low BP, pt cool to touch, labs repeated per Dr Kathrynn Humble. CBC, INR, CMP and TSH re sent to the lab.

## 2019-08-11 NOTE — H&P (Addendum)
NAME:  Monica Mora, MRN:  [id number], DOB:  05-23-1935, LOS: 0 ADMISSION DATE:  08/10/2019, CONSULTATION DATE: 10/21 REFERRING MD: Dr. Kathrynn Humble, CHIEF COMPLAINT: Complete heart block  Brief History   84 year old female found down at home unresponsive and bradycardic.  Emergently intubated and transcutaneously paced in the emergency department. Bradycardia on EKG. She briefly underwent CPR in the emergency department when pacemaker failed to capture.  Taken to Cath Lab for temp pacer.  PCCM asked admit.  History of present illness   84 year old female with past medical history as below, which is significant for A. fib on Eliquis, HFpEF, ILD, pulmonary hypertension.  The patient is intubated and encephalopathic and therefore history is obtained from chart review.  She is reportedly in her usual state of health until the evening hours of 10/21 when she was found down at home by her niece.  EMS was called, and upon their arrival she remained unresponsive.  She was immediately identified to be profoundly bradycardic with heart rate in the 30s.  Glucose also low.  Dextrose was administered and she was transcutaneously paced in route to the emergency department where she was emergently intubated on presentation.  She remained bradycardic and in shock requiring ongoing transcutaneous pacing.  Found to be in complete heart block.  There was a period where the pacer failed to capture and she briefly underwent CPR and received epinephrine prompting ventricular tachycardia.  She was defibrillated back to complete heart block.  She was taken emergently to the cardiac Cath Lab for transvenous pacemaker wire placed.  PCCM asked to admit.   Past Medical History   has a past medical history of Abnormal chest x-ray, Atrial fibrillation ([place]), CHF (congestive heart failure) ([place]), Constrictive pericarditis, Glaucoma, H/O: hysterectomy (1992), Heart murmur, Hyperlipidemia, Nontoxic multinodular goiter, Other voice  and resonance disorders, TB (tuberculosis) (2002), Unspecified essential hypertension, and Unspecified hypothyroidism.  Significant Hospital Events   10/21 intubated, TVP placed in cath lab.   Consults:  Cardiology  Procedures:  ETT 10/21 > Temp pacer 10/21 >  Significant Diagnostic Tests:  CT head 10/21 >>>  Micro Data:  Blood 10/21 > Urine 10/21 >  Antimicrobials:  Flagyl 10/21 Vancomyin 10/21 > Cefepime 10/21 >  Interim history/subjective:    Objective   Blood pressure (!) 137/117, pulse (!) 160, temperature (!) 92.9 F (33.8 C), temperature source Rectal, resp. rate 12, height 5\' 6"  (1.676 m), weight 82.2 kg, SpO2 100 %.    Vent Mode: PRVC FiO2 (%):  [100 %] 100 % Set Rate:  [16 bmp] 16 bmp Vt Set:  [450 mL] 450 mL PEEP:  [8 cmH20] 8 cmH20 Plateau Pressure:  [18 cmH20] 18 cmH20   Intake/Output Summary (Last 24 hours) at 08/15/2019 2255 Last data filed at 08/01/2019 2142 Gross per 24 hour  Intake 635.1 ml  Output -  Net 635.1 ml   Filed Weights   08/01/2019 2011  Weight: 82.2 kg    Examination: General: Frail elderly female in NAD HENT: [place]/AT, PERRL, no JVD Lungs: Clear, bilateral breath sounds Cardiovascular: Bradycardic, regular, no MRG Abdomen: Soft, non-distended Extremities: No acute deformity Neuro: [facility] Problem list     Assessment & Plan:   Bradycardia: question of CHB. ddx includes beta blocker overdose, primary cardiac cause, metabolic. Favor medication effect.  - To cath lab for temp pacer - Chemistries pending - Glucagon bolus and infusion, hold Toprol - Cardiology following - ICU telemetry, hemodynamic monitoring - Check TSH, T4, cortisol (recently on  prednisone for extended period) - epinephrine infusion  Cardiogenic shock: secondary to bradycardia HFpEF Atrial fibrillation history on Eliquis Pulmonary hypertension (on 4 L [place] at home) - Epinephrine infusion to keep MAP > 72mmHg - Pacemaker per  cardiology - Echocardiogram - Holding home Toprol, lasix  Coagulopathy: working theory is polypharmacy/overdose of home meds including Eliquis. INR > 10 on admission.  - Hold eliquis - Repeat coags - Give 2 units FFP - Consider Andexxa should obvious bleeding occur.   Acute hypoxemic respiratory failure - Full vent support - VAP bundle - Follow ABG - Not yet a candidate for SBT.   Anemia: Hgb 5.8 on admit.no evidence of bleeding. FOBT positive. ? GIB - Protonix BID - 1 unit PRBC given, once remaining to be given.  - Give 2 FFP  ILD: Follows with Dr. Melvyn Novas and also [facility]. Previously treated with prednisone. No response. Recently discontinued. Possible adenocarcinoma of lung. Initial biopsies negative. Ongoing workup at [facility] - Supportive care.  Acute metabolic encephalopathy: in the setting of shock/CHB - PRN sedation only.  - RASS goal 0 to -1.   Hypothyroidism: TSH low, T4 elevated on admission, consistent with theory of medication overdose.  - hold synthroid  AKI: recent baseline creatinine roughly 1.3. now 2.5 on admission. Likely due to shock - gentle hydration - follow labs  Best practice:  Diet: NPO Pain/Anxiety/Delirium protocol (if indicated): No sedation needed at this time VAP protocol (if indicated): Per protocol DVT prophylaxis: SCDs only GI prophylaxis: PPI BID Glucose control: SSI Mobility: BR Code Status: FULL Family Communication: Niece updated Disposition: ICU  Labs   CBC: Recent Labs  Lab  1950 08/06/2019 2107  WBC 3.1*  --   HGB 5.8* 15.6*  HCT 18.4* 46.0  MCV 115.7*  --   PLT 51*  --     Basic Metabolic Panel: Recent Labs  Lab 07/26/2019 2107 07/28/2019 2124  NA 130* 132*  K 3.7 4.9  CL  --  102  CO2  --  10*  GLUCOSE  --  63*  BUN  --  36*  CREATININE  --  2.52*  CALCIUM  --  9.0   GFR: Estimated Creatinine Clearance: 18.6 mL/min (A) (by C-G formula based on SCr of 2.52 mg/dL (H)). Recent Labs  Lab 08/03/2019 1950  07/31/2019 1958  WBC 3.1*  --   LATICACIDVEN  --  4.3*    Liver Function Tests: Recent Labs  Lab 08/15/2019 2124  AST 133*  ALT 37  ALKPHOS 112  BILITOT 7.0*  PROT 6.9  ALBUMIN 2.7*   No results for input(s): LIPASE, AMYLASE in the last 168 hours. No results for input(s): AMMONIA in the last 168 hours.  ABG    Component Value Date/Time   PHART 7.214 (L) 07/30/2019 2107   PCO2ART 18.1 (LL) 07/31/2019 2107   PO2ART 60.0 (L) 08/17/2019 2107   HCO3 7.6 (L) 08/05/2019 2107   TCO2 8 (L) 08/07/2019 2107   ACIDBASEDEF 19.0 (H) 07/29/2019 2107   O2SAT 91.0 07/25/2019 2107     Coagulation Profile: Recent Labs  Lab 08/01/2019 2003  INR >10.0*    Cardiac Enzymes: No results for input(s): CKTOTAL, CKMB, CKMBINDEX, TROPONINI in the last 168 hours.  HbA1C: Hgb A1c MFr Bld  Date/Time Value Ref Range Status  03/30/2019 04:32 PM 6.1 4.6 - 6.5 % Final    Comment:    Glycemic Control Guidelines for People with Diabetes:Non Diabetic:  <6%Goal of Therapy: <7%Additional Action Suggested:  >8%  CBG: No results for input(s): GLUCAP in the last 168 hours.  Review of Systems:   Unable patient is encephalopathic  Past Medical History  She,  has a past medical history of Abnormal chest x-ray, Atrial fibrillation ([place]), CHF (congestive heart failure) ([place]), Constrictive pericarditis, Glaucoma, H/O: hysterectomy (1992), Heart murmur, Hyperlipidemia, Nontoxic multinodular goiter, Other voice and resonance disorders, TB (tuberculosis) (2002), Unspecified essential hypertension, and Unspecified hypothyroidism.   Surgical History    Past Surgical History:  Procedure Laterality Date  . ABDOMINAL HYSTERECTOMY  1992  . PERICARDIAL WINDOW  2002  . Radioactive Iodine for hyperthyroidism/Multinodular goiter     s/p  . VIDEO BRONCHOSCOPY Bilateral 01/21/2018   Procedure: VIDEO BRONCHOSCOPY WITH FLUORO;  Surgeon: Tanda Rockers, MD;  Location: [facility];  Service: Cardiopulmonary;   Laterality: Bilateral;  . VIDEO BRONCHOSCOPY WITH ENDOBRONCHIAL NAVIGATION Right 02/11/2018   Procedure: VIDEO BRONCHOSCOPY WITH ENDOBRONCHIAL NAVIGATION;  Surgeon: Collene Gobble, MD;  Location: [facility];  Service: Thoracic;  Laterality: Right;  Marland Kitchen VIDEO BRONCHOSCOPY WITH ENDOBRONCHIAL ULTRASOUND Right 02/11/2018   Procedure: VIDEO BRONCHOSCOPY WITH ENDOBRONCHIAL ULTRASOUND;  Surgeon: Collene Gobble, MD;  Location: [facility];  Service: Thoracic;  Laterality: Right;     Social History   reports that she quit smoking about 40 years ago. Her smoking use included cigarettes. She has a 10.50 pack-year smoking history. She has never used smokeless tobacco. She reports that she does not drink alcohol or use drugs.   Family History   Her family history includes Arthritis in her father and mother; Asthma in her brother; Cancer in her sister; Cervical cancer in her sister.   Allergies No Known Allergies   Home Medications  Prior to Admission medications   Medication Sig Start Date End Date Taking? Authorizing Provider  albuterol (PROAIR HFA) 108 (90 Base) MCG/ACT inhaler Inhale 2 puffs into the lungs every 6 (six) hours as needed for wheezing or shortness of breath. 10/08/18   Tanda Rockers, MD  apixaban (ELIQUIS) 5 MG TABS tablet Take 1 tablet (5 mg total) by mouth 2 (two) times daily. 04/16/19   Fay Records, MD  brimonidine (ALPHAGAN) 0.2 % ophthalmic solution Place 1 drop into the left eye 3 (three) times daily.    [provider]  Cholecalciferol (VITAMIN D) 1000 UNITS capsule Take 1,000 Units by mouth daily.      [provider]  dorzolamide-timolol (COSOPT) 22.3-6.8 MG/ML ophthalmic solution Place 1 drop into both eyes 2 (two) times daily.  11/02/11   [provider]  famotidine (PEPCID) 20 MG tablet TAKE 1 TABLET BY MOUTH EVERYDAY AT BEDTIME 04/22/19   Biagio Borg, MD  furosemide (LASIX) 80 MG tablet Take 1 tablet (80 mg total) by mouth 2 (two) times daily. 04/08/19   Biagio Borg, MD  levothyroxine (SYNTHROID) 75 MCG tablet TAKE 1 TABLET BY MOUTH EVERY DAY 06/28/19   Renato Shin, MD  pantoprazole (PROTONIX) 40 MG tablet Take 1 tablet (40 mg total) by mouth daily. Take 30-60 min before first meal of the day 04/22/19   Biagio Borg, MD  pilocarpine [facility]) 1 % ophthalmic solution Place 1 drop into the left eye 3 (three) times daily.    [provider]  potassium chloride (KLOR-CON 10) 10 MEQ tablet Take 1 tablet (10 mEq total) by mouth daily. 05/18/19   Biagio Borg, MD  predniSONE (DELTASONE) 5 MG tablet TAKE 3 TABLETS (15 MG TOTAL) BY MOUTH DAILY WITH BREAKFAST. 05/03/19  Tanda Rockers, MD  TOPROL XL 50 MG 24 hr tablet TAKE 1 TABLET BY MOUTH EVERY DAY WITH OR IMMEDIATELY FOLLOWING A MEAL. 07/23/19   Fay Records, MD  Travoprost, BAK Free, (TRAVATAN) 0.004 % SOLN ophthalmic solution Place 1 drop into both eyes at bedtime.    [provider]  vitamin B-12 (CYANOCOBALAMIN) 500 MCG tablet Take 500 mcg by mouth daily.    [provider]  vitamin k 100 MCG tablet Take 20 tablets (2,000 mcg total) by mouth daily. 04/22/19   Janith Lima, MD     Critical care time: [address] mins     Georgann Housekeeper, AGACNP-BC [place] Pager [phone] or [phone]  08/10/2019 11:40 PM

## 2019-08-12 ENCOUNTER — Ambulatory Visit: Payer: Medicare Other | Admitting: Internal Medicine

## 2019-08-12 ENCOUNTER — Encounter

## 2019-08-12 ENCOUNTER — Other Ambulatory Visit (HOSPITAL_COMMUNITY): Payer: Medicare Other

## 2019-08-12 ENCOUNTER — Inpatient Hospital Stay (HOSPITAL_COMMUNITY): Payer: Medicare Other

## 2019-08-12 ENCOUNTER — Ambulatory Visit: Payer: Medicare Other | Admitting: Cardiology

## 2019-08-12 ENCOUNTER — Encounter (HOSPITAL_COMMUNITY): Payer: Self-pay | Admitting: Interventional Cardiology

## 2019-08-12 DIAGNOSIS — J9622 Acute and chronic respiratory failure with hypercapnia: Secondary | ICD-10-CM

## 2019-08-12 DIAGNOSIS — R579 Shock, unspecified: Secondary | ICD-10-CM

## 2019-08-12 DIAGNOSIS — J9621 Acute and chronic respiratory failure with hypoxia: Secondary | ICD-10-CM

## 2019-08-12 DIAGNOSIS — D649 Anemia, unspecified: Secondary | ICD-10-CM

## 2019-08-12 LAB — POCT I-STAT 7, (LYTES, BLD GAS, ICA,H+H)
Acid-base deficit: 13 mmol/L — ABNORMAL HIGH (ref 0.0–2.0)
Acid-base deficit: 21 mmol/L — ABNORMAL HIGH (ref 0.0–2.0)
Bicarbonate: 16.8 mmol/L — ABNORMAL LOW (ref 20.0–28.0)
Bicarbonate: 9.2 mmol/L — ABNORMAL LOW (ref 20.0–28.0)
Calcium, Ion: 1.06 mmol/L — ABNORMAL LOW (ref 1.15–1.40)
Calcium, Ion: 0.71 mmol/L — CL (ref 1.15–1.40)
HCT: 46 % (ref 36.0–46.0)
HCT: 32 % — ABNORMAL LOW (ref 36.0–46.0)
Hemoglobin: 15.6 g/dL — ABNORMAL HIGH (ref 12.0–15.0)
Hemoglobin: 10.9 g/dL — ABNORMAL LOW (ref 12.0–15.0)
O2 Saturation: 42 %
O2 Saturation: 29 %
Patient temperature: 34.3
Patient temperature: 34.4
Potassium: 3.6 mmol/L (ref 3.5–5.1)
Potassium: <2 mmol/L — CL (ref 3.5–5.1)
Sodium: 137 mmol/L (ref 135–145)
Sodium: 134 mmol/L — ABNORMAL LOW (ref 135–145)
TCO2: 18 mmol/L — ABNORMAL LOW (ref 22–32)
TCO2: 10 mmol/L — ABNORMAL LOW (ref 22–32)
pCO2 arterial: 44.6 mmHg (ref 32.0–48.0)
pCO2 arterial: 31.4 mmHg — ABNORMAL LOW (ref 32.0–48.0)
pH, Arterial: 7.168 — CL (ref 7.350–7.450)
pH, Arterial: 7.059 — CL (ref 7.350–7.450)
pO2, Arterial: 26 mmHg — CL (ref 83.0–108.0)
pO2, Arterial: 22 mmHg — CL (ref 83.0–108.0)

## 2019-08-12 LAB — PROTIME-INR
INR: 5.6 (ref 0.8–1.2)
Prothrombin Time: 50 seconds — ABNORMAL HIGH (ref 11.4–15.2)

## 2019-08-12 LAB — CBC WITH DIFFERENTIAL/PLATELET
Abs Immature Granulocytes: 0.07 10*3/uL (ref 0.00–0.07)
Basophils Absolute: 0 10*3/uL (ref 0.0–0.1)
Basophils Relative: 0 %
Eosinophils Absolute: 0 10*3/uL (ref 0.0–0.5)
Eosinophils Relative: 0 %
HCT: 43.9 % (ref 36.0–46.0)
Hemoglobin: 13.6 g/dL (ref 12.0–15.0)
Immature Granulocytes: 1 %
Lymphocytes Relative: 15 %
Lymphs Abs: 1.4 10*3/uL (ref 0.7–4.0)
MCH: 35.4 pg — ABNORMAL HIGH (ref 26.0–34.0)
MCHC: 31 g/dL (ref 30.0–36.0)
MCV: 114.3 fL — ABNORMAL HIGH (ref 80.0–100.0)
Monocytes Absolute: 0.8 10*3/uL (ref 0.1–1.0)
Monocytes Relative: 8 %
Neutro Abs: 7.5 10*3/uL (ref 1.7–7.7)
Neutrophils Relative %: 76 %
Platelets: 112 10*3/uL — ABNORMAL LOW (ref 150–400)
RBC: 3.84 MIL/uL — ABNORMAL LOW (ref 3.87–5.11)
RDW: 19.9 % — ABNORMAL HIGH (ref 11.5–15.5)
WBC: 9.8 10*3/uL (ref 4.0–10.5)
nRBC: 1.9 % — ABNORMAL HIGH (ref 0.0–0.2)

## 2019-08-12 LAB — COMPREHENSIVE METABOLIC PANEL
ALT: 146 U/L — ABNORMAL HIGH (ref 0–44)
AST: 578 U/L — ABNORMAL HIGH (ref 15–41)
Albumin: 2.6 g/dL — ABNORMAL LOW (ref 3.5–5.0)
Alkaline Phosphatase: 100 U/L (ref 38–126)
Anion gap: 24 — ABNORMAL HIGH (ref 5–15)
BUN: 36 mg/dL — ABNORMAL HIGH (ref 8–23)
CO2: 15 mmol/L — ABNORMAL LOW (ref 22–32)
Calcium: 8.9 mg/dL (ref 8.9–10.3)
Chloride: 97 mmol/L — ABNORMAL LOW (ref 98–111)
Creatinine, Ser: 2.94 mg/dL — ABNORMAL HIGH (ref 0.44–1.00)
GFR calc Af Amer: 16 mL/min — ABNORMAL LOW (ref >60)
GFR calc non Af Amer: 14 mL/min — ABNORMAL LOW (ref >60)
Glucose, Bld: 115 mg/dL — ABNORMAL HIGH (ref 70–99)
Potassium: 4 mmol/L (ref 3.5–5.1)
Sodium: 136 mmol/L (ref 135–145)
Total Bilirubin: 8.6 mg/dL — ABNORMAL HIGH (ref 0.3–1.2)
Total Protein: 6.1 g/dL — ABNORMAL LOW (ref 6.5–8.1)

## 2019-08-12 LAB — URINALYSIS, ROUTINE W REFLEX MICROSCOPIC
Bilirubin Urine: NEGATIVE
Glucose, UA: NEGATIVE mg/dL
Ketones, ur: NEGATIVE mg/dL
Leukocytes,Ua: NEGATIVE
Nitrite: NEGATIVE
Protein, ur: >=300 mg/dL — AB
Specific Gravity, Urine: 1.011 (ref 1.005–1.030)
pH: 5 (ref 5.0–8.0)

## 2019-08-12 LAB — BASIC METABOLIC PANEL
Anion gap: 21 — ABNORMAL HIGH (ref 5–15)
BUN: 36 mg/dL — ABNORMAL HIGH (ref 8–23)
CO2: 17 mmol/L — ABNORMAL LOW (ref 22–32)
Calcium: 10 mg/dL (ref 8.9–10.3)
Chloride: 101 mmol/L (ref 98–111)
Creatinine, Ser: 2.79 mg/dL — ABNORMAL HIGH (ref 0.44–1.00)
GFR calc Af Amer: 18 mL/min — ABNORMAL LOW (ref >60)
GFR calc non Af Amer: 15 mL/min — ABNORMAL LOW (ref >60)
Glucose, Bld: 62 mg/dL — ABNORMAL LOW (ref 70–99)
Potassium: 4 mmol/L (ref 3.5–5.1)
Sodium: 139 mmol/L (ref 135–145)

## 2019-08-12 LAB — MRSA PCR SCREENING: MRSA by PCR: NEGATIVE

## 2019-08-12 LAB — GLUCOSE, CAPILLARY: Glucose-Capillary: 91 mg/dL (ref 70–99)

## 2019-08-12 LAB — URINE CULTURE: Culture: NO GROWTH

## 2019-08-12 LAB — APTT: aPTT: 48 seconds — ABNORMAL HIGH (ref 24–36)

## 2019-08-12 LAB — CBC
HCT: 44.6 % (ref 36.0–46.0)
Hemoglobin: 14.9 g/dL (ref 12.0–15.0)
MCH: 35.2 pg — ABNORMAL HIGH (ref 26.0–34.0)
MCHC: 33.4 g/dL (ref 30.0–36.0)
MCV: 105.4 fL — ABNORMAL HIGH (ref 80.0–100.0)
Platelets: 120 10*3/uL — ABNORMAL LOW (ref 150–400)
RBC: 4.23 MIL/uL (ref 3.87–5.11)
RDW: 18.9 % — ABNORMAL HIGH (ref 11.5–15.5)
WBC: 8 10*3/uL (ref 4.0–10.5)
nRBC: 1.4 % — ABNORMAL HIGH (ref 0.0–0.2)

## 2019-08-12 LAB — ECHOCARDIOGRAM COMPLETE
Height: 66 in
Weight: 3308.66 oz

## 2019-08-12 LAB — CORTISOL: Cortisol, Plasma: 22.1 ug/dL

## 2019-08-12 LAB — MAGNESIUM: Magnesium: 2.2 mg/dL (ref 1.7–2.4)

## 2019-08-12 LAB — LACTIC ACID, PLASMA
Lactic Acid, Venous: 9.5 mmol/L (ref 0.5–1.9)
Lactic Acid, Venous: 10 mmol/L (ref 0.5–1.9)

## 2019-08-12 LAB — FOLATE: Folate: 39.1 ng/mL (ref >5.9)

## 2019-08-12 LAB — PHOSPHORUS: Phosphorus: 9.7 mg/dL — ABNORMAL HIGH (ref 2.5–4.6)

## 2019-08-12 LAB — TROPONIN I (HIGH SENSITIVITY): Troponin I (High Sensitivity): 1456 ng/L (ref <18)

## 2019-08-12 MED ORDER — GLYCOPYRROLATE 1 MG PO TABS
1.0000 mg | ORAL_TABLET | ORAL | Status: DC | PRN
  Filled 2019-08-12: qty 1

## 2019-08-12 MED ORDER — GLYCOPYRROLATE 0.2 MG/ML IJ SOLN: 0.2000 mg | INTRAMUSCULAR | Status: DC | PRN

## 2019-08-12 MED ORDER — VITAMIN K1 10 MG/ML IJ SOLN
5.0000 mg | Freq: Once | INTRAVENOUS | Status: AC
  Administered 2019-08-12: 5 mg via INTRAVENOUS
  Filled 2019-08-12: qty 0.5

## 2019-08-12 MED ORDER — SODIUM CHLORIDE 0.9% FLUSH: 10.0000 mL | INTRAVENOUS | Status: DC | PRN

## 2019-08-12 MED ORDER — NOREPINEPHRINE 4 MG/250ML-% IV SOLN
INTRAVENOUS | Status: AC
  Administered 2019-08-12: 4 mg
  Filled 2019-08-12: qty 250

## 2019-08-12 MED ORDER — ALBUMIN HUMAN 5 % IV SOLN: 25.0000 g | Freq: Once | INTRAVENOUS | Status: DC

## 2019-08-12 MED ORDER — CALCIUM GLUCONATE-NACL 1-0.675 GM/50ML-% IV SOLN
INTRAVENOUS | Status: AC
  Filled 2019-08-12: qty 50

## 2019-08-12 MED ORDER — SODIUM BICARBONATE-DEXTROSE 150-5 MEQ/L-% IV SOLN
150.0000 meq | INTRAVENOUS | Status: DC
  Administered 2019-08-12 (×2): 150 meq via INTRAVENOUS
  Filled 2019-08-12 (×3): qty 1000

## 2019-08-12 MED ORDER — "THROMBI-PAD 3""X3"" EX PADS"
1.0000 | MEDICATED_PAD | Freq: Once | CUTANEOUS | Status: DC
  Filled 2019-08-12: qty 1

## 2019-08-12 MED ORDER — FENTANYL BOLUS VIA INFUSION
100.0000 ug | INTRAVENOUS | Status: DC | PRN
  Filled 2019-08-12: qty 100

## 2019-08-12 MED ORDER — HALOPERIDOL LACTATE 5 MG/ML IJ SOLN: 2.5000 mg | INTRAMUSCULAR | Status: DC | PRN

## 2019-08-12 MED ORDER — FENTANYL 2500MCG IN NS 250ML (10MCG/ML) PREMIX INFUSION: 0.0000 ug/h | INTRAVENOUS | Status: DC

## 2019-08-12 MED ORDER — ORAL CARE MOUTH RINSE
15.0000 mL | OROMUCOSAL | Status: DC
  Administered 2019-08-12 (×5): 15 mL via OROMUCOSAL

## 2019-08-12 MED ORDER — LACTATED RINGERS IV BOLUS: 1000.0000 mL | Freq: Once | INTRAVENOUS | Status: DC

## 2019-08-12 MED ORDER — CHLORHEXIDINE GLUCONATE CLOTH 2 % EX PADS: 6.0000 | MEDICATED_PAD | Freq: Every day | CUTANEOUS | Status: DC

## 2019-08-12 MED ORDER — SODIUM BICARBONATE 8.4 % IV SOLN
INTRAVENOUS | Status: AC
  Administered 2019-08-12: 50 meq
  Filled 2019-08-12: qty 100

## 2019-08-12 MED ORDER — CALCIUM GLUCONATE-NACL 2-0.675 GM/100ML-% IV SOLN
2.0000 g | Freq: Once | INTRAVENOUS | Status: DC
  Filled 2019-08-12: qty 100

## 2019-08-12 MED ORDER — GLUCAGON HCL RDNA (DIAGNOSTIC) 1 MG IJ SOLR
INTRAMUSCULAR | Status: AC
  Administered 2019-08-12: 1 mg
  Filled 2019-08-12: qty 1

## 2019-08-12 MED ORDER — CHLORHEXIDINE GLUCONATE 0.12% ORAL RINSE (MEDLINE KIT)
15.0000 mL | Freq: Two times a day (BID) | OROMUCOSAL | Status: DC
  Administered 2019-08-12 (×2): 15 mL via OROMUCOSAL

## 2019-08-12 MED ORDER — NOREPINEPHRINE 4 MG/250ML-% IV SOLN
0.0000 ug/min | INTRAVENOUS | Status: DC
  Administered 2019-08-12: 10 ug/min via INTRAVENOUS
  Administered 2019-08-12: 15 ug/min via INTRAVENOUS
  Administered 2019-08-12 (×2): 45 ug/min via INTRAVENOUS
  Administered 2019-08-12: 40 ug/min via INTRAVENOUS
  Filled 2019-08-12 (×5): qty 250

## 2019-08-12 MED ORDER — POTASSIUM CHLORIDE 10 MEQ/50ML IV SOLN
10.0000 meq | INTRAVENOUS | Status: AC
  Administered 2019-08-12 (×3): 10 meq via INTRAVENOUS
  Filled 2019-08-12 (×3): qty 50

## 2019-08-12 MED ORDER — SODIUM BICARBONATE 8.4 % IV SOLN
150.0000 meq | Freq: Once | INTRAVENOUS | Status: AC
  Administered 2019-08-12: 150 meq via INTRAVENOUS

## 2019-08-12 MED ORDER — SODIUM CHLORIDE 0.9% FLUSH
10.0000 mL | Freq: Two times a day (BID) | INTRAVENOUS | Status: DC
  Administered 2019-08-12: 30 mL

## 2019-08-12 MED ORDER — SODIUM BICARBONATE 8.4 % IV SOLN: INTRAVENOUS | Status: DC

## 2019-08-12 MED ORDER — POLYVINYL ALCOHOL 1.4 % OP SOLN
1.0000 [drp] | Freq: Four times a day (QID) | OPHTHALMIC | Status: DC | PRN
  Filled 2019-08-12: qty 15

## 2019-08-12 MED ORDER — SILDENAFIL CITRATE 20 MG PO TABS
20.0000 mg | ORAL_TABLET | Freq: Three times a day (TID) | ORAL | Status: DC
  Administered 2019-08-12: 20 mg
  Filled 2019-08-12 (×2): qty 1

## 2019-08-12 MED ORDER — METHYLPREDNISOLONE SODIUM SUCC 125 MG IJ SOLR
60.0000 mg | Freq: Three times a day (TID) | INTRAMUSCULAR | Status: DC
  Administered 2019-08-12: 60 mg via INTRAVENOUS
  Filled 2019-08-12: qty 2

## 2019-08-12 MED ORDER — FENTANYL CITRATE (PF) 100 MCG/2ML IJ SOLN: 50.0000 ug | INTRAMUSCULAR | Status: DC | PRN

## 2019-08-12 MED FILL — Medication: Qty: 2 | Status: AC

## 2019-08-13 LAB — BPAM RBC
Blood Product Expiration Date: 202010272359
Blood Product Expiration Date: 202011172359
Blood Product Expiration Date: 202011172359
Blood Product Expiration Date: 202011172359
ISSUE DATE / TIME: 202010212127
ISSUE DATE / TIME: 202010220030
Unit Type and Rh: 7300
Unit Type and Rh: 7300
Unit Type and Rh: 7300
Unit Type and Rh: 7300

## 2019-08-13 LAB — TYPE AND SCREEN
ABO/RH(D): B POS
Antibody Screen: NEGATIVE
Unit division: 0
Unit division: 0
Unit division: 0
Unit division: 0

## 2019-08-13 LAB — BPAM FFP
Blood Product Expiration Date: 202010262359
Blood Product Expiration Date: 202010262359
Blood Product Expiration Date: 202010272359
ISSUE DATE / TIME: 202010220030
ISSUE DATE / TIME: 202010220030
ISSUE DATE / TIME: 202010220542
Unit Type and Rh: 7300
Unit Type and Rh: 7300
Unit Type and Rh: 8400

## 2019-08-13 LAB — PREPARE FRESH FROZEN PLASMA
Unit division: 0
Unit division: 0
Unit division: 0

## 2019-08-13 LAB — PROTIME-INR
INR: 4.3 (ref 0.8–1.2)
Prothrombin Time: 40.8 seconds — ABNORMAL HIGH (ref 11.4–15.2)

## 2019-08-13 LAB — T3: T3, Total: 216 ng/dL — ABNORMAL HIGH (ref 71–180)

## 2019-08-16 ENCOUNTER — Telehealth: Payer: Self-pay

## 2019-08-16 NOTE — Telephone Encounter (Signed)
Received dc from [place].  DC is for burial and a patient of Kristen Scatliffe.  DC will be taken to Pulmonary Unit for signature.  On 08/17/2019 Received dc back from Doctor Halford Chessman, I called the funeral home to let them know the dc is ready for pickup.

## 2019-08-17 ENCOUNTER — Ambulatory Visit: Payer: Medicare Other | Admitting: Endocrinology

## 2019-08-17 LAB — CULTURE, BLOOD (ROUTINE X 2)
Culture: NO GROWTH
Culture: NO GROWTH

## 2019-08-22 NOTE — Procedures (Signed)
Central Venous Catheter Insertion Procedure Note Sidonie Dexheimer Schwartzman [id number] 12-21-1934  Procedure: Insertion of Central Venous Catheter Indications: Drug and/or fluid administration  Procedure Details Consent: Unable to obtain consent because of emergent medical necessity. Time Out: Verified patient identification, verified procedure, site/side was marked, verified correct patient position, special equipment/implants available, medications/allergies/relevent history reviewed, required imaging and test results available.  Performed  Maximum sterile technique was used including antiseptics, cap, gloves, gown, hand hygiene, mask and sheet. Skin prep: Chlorhexidine; local anesthetic administered A antimicrobial bonded/coated triple lumen catheter was placed in the left internal jugular vein using the Seldinger technique. Catheter placed to 20 cm. Blood aspirated via all 3 ports and then flushed x 3.  Mattress suture placed around line to prevent oozing.  Biopatch placed.  Line sutured x 2 and dressing applied.  Ultrasound guidance used.Yes.    Evaluation Blood flow good Complications: No apparent complications Patient did tolerate procedure well. Chest X-ray ordered to verify placement.  CXR: pending.   Georgann Housekeeper, AGACNP-BC [place] Pager [phone] or [phone]  08/20/19 2:03 AM

## 2019-08-22 NOTE — Progress Notes (Signed)
   2019/09/05 0900  Clinical Encounter Type  Visited With Patient not available;Health care provider;Patient and family together;Family  Visit Type Initial;Follow-up;Psychological support;Spiritual support;ED;Critical Care;Code  Referral From Other (Comment) (Urgent CL page, code blue)  Consult/Referral To Chaplain  Spiritual Encounters  Spiritual Needs Emotional;Prayer  Stress Factors  Patient Stress Factors Not reviewed  Family Stress Factors Loss of control;Major life changes;Exhausted   Several visits, first call to ED, family not present yet.  Present w/ pt's niece when pt coding, niece declined support at that time.  Met w/ niece multiple times after pt went to CL and to 2H and niece was very receptive, events had reminded her of cardiac incidents when her own parents had each died.  Pt has 2 living sisters, in [place] and [place], niece in [place].  Pt described as strong person of faith.  Temple Pacini [place], [phone]

## 2019-08-22 NOTE — Progress Notes (Addendum)
PCCM INTERVAL PROGRESS NOTE   Called back to bedside for hypoxia and ongoing shock. Art line felt to be placed venously. Cannot replaced due to elevated INR. INR now 5.6 after 2 FFP and 5mg  vit K. Will give additional FFP and vit K then repeat INR.  Remains in shock requiring 2 pressors. Will add solumedrol, she has recently been on long term prednisone so perhaps she is adrenally insufficient. Critically ill, family is bedside and has been updated. RV failure with severe PH. Start sildenafil 20mg  TID. D/w Dr. Gilford Raid.    Georgann Housekeeper, AGACNP-BC [place] Pager [phone] or [phone]  09-10-19 4:26 AM

## 2019-08-22 NOTE — Progress Notes (Signed)
  Echocardiogram 2D Echocardiogram has been performed.  Monica Mora 09/09/19, 11:31 AM

## 2019-08-22 NOTE — Progress Notes (Signed)
[facility] Progress Note Patient Name: Aolanis Crispen Gladwin DOB: 01-22-1935 MRN: [id number]   Date of Service  09/06/2019  HPI/Events of Note  Pt needs additional a.m. labs, K+ and Ca++ low.  eICU Interventions  A.m. labs ordered, K+ and Ca++ repleted.        Kerry Kass Leiya Keesey 06-Sep-2019, 5:58 AM

## 2019-08-22 NOTE — Procedures (Signed)
Arterial Catheter Insertion Procedure Note Monica Mora [id number] 05-13-1935  Procedure: Insertion of Arterial Catheter  Indications: Blood pressure monitoring and Frequent blood sampling  Procedure Details Consent: Unable to obtain consent because of emergent medical necessity. Time Out: Verified patient identification, verified procedure, site/side was marked, verified correct patient position, special equipment/implants available, medications/allergies/relevent history reviewed, required imaging and test results available.  Performed  Maximum sterile technique was used including antiseptics, gloves, hand hygiene, mask and sheet. Skin prep: Chlorhexidine; local anesthetic administered 20 gauge catheter was inserted into left femoral artery using the Seldinger technique with anterior wall stick. ULTRASOUND GUIDANCE USED: YES  Evaluation Blood flow sluggish but pulsatile, good blood return with aspiration, BP tracing good, though discordant with cuff pressure (much lower). Sutured in place then intraluminal arterial cannula position confirmed with ultrasound again, still with discordant pressure. Complications: No apparent complications.   Marykay Lex 2019/08/13

## 2019-08-22 NOTE — Progress Notes (Signed)
Chaplain responded to request for end of life family care. Family was at the bedside. Chaplain was not needed. Family stated that night chaplain had covered their needs and they has spoken to their pastor today. At this time no further care is needed. Family thanks [place] for our care. Chaplains remain available for support.   Chaplain Resident, Evelene Croon, [facility] Pager # [phone] on-call

## 2019-08-22 NOTE — Progress Notes (Signed)
CRITICAL VALUE ALERT  Critical Value:  PTT 40.8, INR 4.3  Date & Time Notified:  2019/08/31 1016  Provider Notified: CCM aware.  Orders Received/Actions taken: No new orders; levels improved since previous labs.  Joellen Jersey, RN

## 2019-08-22 NOTE — Significant Event (Signed)
PCCM INTERVAL PROGRESS NOTE   Code status changed to DNR after discussion between Dr. Gilford Raid and patient's niece.   Full medical care otherwise.     Georgann Housekeeper, AGACNP-BC [place] Pager [phone] or [phone]  August 21, 2019 7:01 AM

## 2019-08-22 NOTE — Progress Notes (Signed)
Progress Note  Patient Name: Monica Mora Date of Encounter: 2019/09/05  Primary Cardiologist: Harrington Challenger  Subjective   Pt intubated   Inpatient Medications    Scheduled Meds:  chlorhexidine gluconate (MEDLINE KIT)  15 mL Mouth Rinse BID   Chlorhexidine Gluconate Cloth  6 each Topical Daily   Chlorhexidine Gluconate Cloth  6 each Topical Daily   mouth rinse  15 mL Mouth Rinse 10 times per day   methylPREDNISolone (SOLU-MEDROL) injection  60 mg Intravenous Q8H   pantoprazole (PROTONIX) IV  40 mg Intravenous Q12H   sildenafil  20 mg Per Tube TID   sodium chloride flush  10-40 mL Intracatheter Q12H   sodium chloride flush  3 mL Intravenous Once   Thrombi-Pad  1 each Topical Once   Continuous Infusions:  sodium chloride     ceFEPime (MAXIPIME) IV     epinephrine 25 mcg/min (09-05-19 0940)   norepinephrine (LEVOPHED) Adult infusion 40 mcg/min (09/05/19 0943)   sodium bicarbonate 150 mEq in dextrose 5% 1000 mL 150 mL/hr at 09/05/19 0900   PRN Meds: fentaNYL (SUBLIMAZE) injection, fentaNYL (SUBLIMAZE) injection, midazolam, sodium chloride flush   Vital Signs    Vitals:   09-05-2019 0836 September 05, 2019 0845 09/05/19 0900 2019/09/05 0915  BP: (!) 76/58 (!) 79/64 (!) 83/66 (!) 78/55  Pulse: 70     Resp: (!) 23 18 (!) 21 (!) 21  Temp:  (!) 93.6 F (34.2 C) (!) 93.4 F (34.1 C) (!) 93.2 F (34 C)  TempSrc:      SpO2: (!) 31% (!) 32% (!) 36% (!) 34%  Weight:      Height:        Intake/Output Summary (Last 24 hours) at 09/05/19 0956 Last data filed at 09-05-2019 0900 Gross per 24 hour  Intake 4987.82 ml  Output 65 ml  Net 4922.82 ml   Last 3 Weights 09/05/2019 08/13/2019 05/18/2019  Weight (lbs) 206 lb 12.7 oz 181 lb 3.5 oz 181 lb 3.2 oz  Weight (kg) 93.8 kg 82.2 kg 82.192 kg      Telemetry    Paced    - Personally Reviewed  ECG     Not done today - Personally Reviewed  Physical Exam  Pt intubated on multiple pressors  Neck:  Unable to assess JVP     Cardiac: RRR, no murmurs, rubs, or gallops.  Respiratory  Coarse BS bilaterall   + rales   GI: Soft, nontender, non-distended  MS: TR edema;  =  Labs    High Sensitivity Troponin:   Recent Labs  Lab 09/05/2019 0104  TROPONINIHS 1,456*      Chemistry Recent Labs  Lab 08/10/2019 2124 09-05-2019 0104 09-05-19 0121 09-05-19 0225 09-05-2019 0637  NA 132* 139 137 134* 136  K 4.9 4.0 3.6 <2.0* 4.0  CL 102 101  --   --  97*  CO2 10* 17*  --   --  15*  GLUCOSE 63* 62*  --   --  115*  BUN 36* 36*  --   --  36*  CREATININE 2.52* 2.79*  --   --  2.94*  CALCIUM 9.0 10.0  --   --  8.9  PROT 6.9  --   --   --  6.1*  ALBUMIN 2.7*  --   --   --  2.6*  AST 133*  --   --   --  578*  ALT 37  --   --   --  146*  ALKPHOS  112  --   --   --  100  BILITOT 7.0*  --   --   --  8.6*  GFRNONAA 17* 15*  --   --  14*  GFRAA 20* 18*  --   --  16*  ANIONGAP 20* 21*  --   --  24*     Hematology Recent Labs  Lab 07/29/2019 1950  08/21/2019 2135 08/20/2019 0104 Aug 20, 2019 0121 August 20, 2019 0225 08-20-19 0637  WBC 3.1*  --   --  8.0  --   --  9.8  RBC 1.59*  --  3.84* 4.23  --   --  3.84*  HGB 5.8*   < >  --  14.9 15.6* 10.9* 13.6  HCT 18.4*   < >  --  44.6 46.0 32.0* 43.9  MCV 115.7*  --   --  105.4*  --   --  114.3*  MCH 36.5*  --   --  35.2*  --   --  35.4*  MCHC 31.5  --   --  33.4  --   --  31.0  RDW 18.0*  --   --  18.9*  --   --  19.9*  PLT 51*  --   --  120*  --   --  112*   < > = values in this interval not displayed.    BNPNo results for input(s): BNP, PROBNP in the last 168 hours.   DDimer No results for input(s): DDIMER in the last 168 hours.   Radiology    Dg Chest Port 1 View  Result Date: 20-Aug-2019 CLINICAL DATA:  Central line placement. EXAM: PORTABLE CHEST 1 VIEW COMPARISON:  Radiograph yesterday at 2149 hour FINDINGS: Tip of the left internal jugular central venous catheter is in the upper SVC. No pneumothorax. Endotracheal tube tip 3.8 cm from the carina. Enteric tube in place  with tip below the diaphragm not included in the field of view. Epicardial lead. Interval worsening of diffuse airspace disease. Possible right pleural effusion. Cardiomegaly is unchanged. IMPRESSION: 1. Tip of the left internal jugular central venous catheter in the upper SVC. No pneumothorax. 2. Interval worsening of diffuse airspace disease, pulmonary edema favored over multifocal pneumonia. 3. Possible increasing right pleural effusion. Electronically Signed   By: Keith Rake M.D.   On: 2019/08/20 02:50   Dg Chest Port 1 View  Result Date: 07/26/2019 CLINICAL DATA:  Repositioning of endotracheal tube. EXAM: PORTABLE CHEST 1 VIEW 9:49 p.m. COMPARISON:  07/22/2019 at 8:26 p.m. FINDINGS: Endotracheal tube tip is now 4.5 cm above the carina in good position. NG tube tip is below the diaphragm. Cardiomegaly. Mild interstitial edema, essentially unchanged. No discrete effusion. Aortic atherosclerosis. IMPRESSION: 1. Endotracheal tube tip is 4.5 cm above the carina, in good position. 2. Stable mild interstitial edema. 3. Aortic atherosclerosis. Electronically Signed   By: Lorriane Shire M.D.   On: 08/06/2019 22:02   Dg Chest Port 1 View  Result Date: 08/02/2019 CLINICAL DATA:  Intubation EXAM: PORTABLE CHEST 1 VIEW COMPARISON:  July 21, 2019 FINDINGS: Endotracheal tube is just entering the right mainstem bronchus. NG tube is seen coursing below the diaphragm. There is coarsened interstitial markings throughout both lungs. There is marked cardiomegaly. Aortic knob calcifications. IMPRESSION: Endotracheal tube tip just entering the right mainstem bronchus and could be retracted approximately 2 cm. Cardiomegaly and interstitial edema. These results were called by telephone at the time of interpretation on 07/27/2019 at 8:59 pm to provider ANKIT NANAVATI ,  who verbally acknowledged these results. Electronically Signed   By: Prudencio Pair M.D.   On: 08/02/2019 20:59    Cardiac Studies   RHC 12/2018,  [facility]: RA 14/12 with a mean of 10 RV 88/16 PA 87/30 with a mean of 51 PC wedge 8/16 with a mean of 9 Aortic saturation by pulse oximetry was 89% with the patient on 2 L of nasal O2. Measured PA saturation was 47.4. Fick cardiac output was 2.22 with an index of 1.1 (on supplemental oxygen).   TTE 12/18/2018, [facility]: There is normal left ventricular systolic function. The EF is estimated at 50-55%. The right ventricular cavity size is moderately enlarged. The right ventricular global systolic function is moderately reduced. The right ventricular systolic pressure is estimated to be 65-78mHg.  Mild AI, mild MR, moderate TR No pericardial effusion Echo May 2019  - Left ventricle: The cavity size was normal. Wall thickness was   normal. Systolic function was normal. The estimated ejection   fraction was in the range of 55% to 60%. Wall motion was normal;   there were no regional wall motion abnormalities. Doppler   parameters are consistent with abnormal left ventricular   relaxation (grade 1 diastolic dysfunction). - Ventricular septum: D-shaped interventricular septum in diastole   and systole, suggesting RV pressure/volume overload. - Aortic valve: There was no stenosis. - Mitral valve: Mildly calcified annulus. There was no significant   regurgitation. - Right ventricle: The cavity size was moderately dilated. Systolic   function was mildly reduced. - Right atrium: The atrium was mildly to moderately dilated. - Tricuspid valve: Peak RV-RA gradient (S): 87 mm Hg. - Pulmonary arteries: PA peak pressure: 102 mm Hg (S). - Systemic veins: IVC measured 2.6 cm with < 50% respirophasic   variation, suggesting RA pressure 15 mmHg.  Impressions:  - Normal LV size with EF 55-60%. The RV was moderately dilated with   mildly decreased systolic function. D-shaped interventricular   septum was suggestive of RV pressure/volume overload. Severe   pulmonary hypertension.   Patient  Profile     Monica Mora is a 84y.o. female with a history of paroxysmal AF on Eliquis, ILD with chronic hypoxic respiratory failure on 4L, [facility], severe PH, hypothyroidism from prior RI, and constrictive pericarditis (treated surgically). She is being seen today for the evaluation of bradycardia.   Assessment & Plan    1  Bradycardia   Pt remains V paced with temporary pacer   WOuld keep at current settings  2  Pulmonary  Interstitial lung dz and severe pulmonary HTN   Decompensated in setting of severe anemia and bradycardia    [facility] now here    CCM managing   3   ANemia   Pt's Hgb 5.8 yesteray  (14 3 wks ago)   INR supratherapeutic (most likely due to poor metabolism due to low flow state)  Off anticoagulatn  Now Hbg 14.9 after transfusion    INR 5.6 this am   Given plasma  4   PAF Follow tele   4  REnal  Cr 2.94 today      Spoke to family member   She is aware pt is critically ill and that prognosis is poor   Agrees to continued medical Rx   But as noted pt is DNR.  For questions or updates, please contact [facility] Please consult [email or web address] for contact info under        Signed, Dorris Carnes, MD  09/03/19, 9:56 AM

## 2019-08-22 NOTE — ED Provider Notes (Addendum)
[facility] Provider Note   CSN: [id number] Arrival date & time: 07/27/2019  1943     History   Chief Complaint Chief Complaint  Patient presents with  . Loss of Consciousness    HPI Monica Mora is a 84 y.o. female.     HPI  84 year old female comes in a chief complaint of LOC. Level 5 caveat for unresponsive patient.  Patient has history of CHF, A. fib on Eliquis, constrictive pericarditis, thyroid goiter.  According to the EMS, patient's neighbors called EMS because she was "lethargic".  When EMS arrived, patient was noted to be weak by them with a blood sugar less than 30.  She was speaking to them and had a heart rate in the 73Z and systolic blood pressure in the low 100s.  Patient was given D10, but in route to the ED she started having drop in her heart rate and became unresponsive.  EMS team started bagging the patient and they started transcutaneously pacing her.  She was given Versed 2.5 mg per their protocol.   Past Medical History:  Diagnosis Date  . Abnormal chest x-ray   . Atrial fibrillation ([place])   . CHF (congestive heart failure) ([place])   . Constrictive pericarditis    s/p pericardial window in 2002 in [place]  . Glaucoma   . H/O: hysterectomy 1992  . Heart murmur   . Hyperlipidemia   . Nontoxic multinodular goiter   . Other voice and resonance disorders   . TB (tuberculosis) 2002   positive skin test, negative chest x-ray  . Unspecified essential hypertension   . Unspecified hypothyroidism     Patient Active Problem List   Diagnosis Date Noted  . CHB (complete heart block) ([place]) 08/20/2019  . Persistent severe sinus bradycardia   . Hypotension   . Cardiogenic shock ([place])   . Hypokalemia 05/18/2019  . Coumadin toxicity 04/22/2019  . Peripheral edema 03/30/2019  . 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 ILD (interstitial lung disease) ([place]) 01/07/2019  . Chronic respiratory failure with hypoxia ([place]) 05/30/2018   . Positive ANA (antinuclear antibody) 05/04/2018  . Nocturnal hypoxemia 04/15/2018  . Other secondary pulmonary hypertension ([place]) 03/08/2018  . Pulmonary infiltrates on CXR 02/13/2018  . Mediastinal lymphadenopathy   . Lung nodule 01/01/2018  . Upper airway cough syndrome 11/22/2017  . Abnormal PFTs (pulmonary function tests) 10/17/2017  . CAP (community acquired pneumonia) 07/09/2017  . Allergic rhinitis 07/03/2017  . Dyspnea on exertion 05/22/2017  . Viral bronchitis 09/15/2015  . Shingles rash 04/25/2014  . Trigger finger, acquired 03/30/2014  . Fever blister 05/18/2013  . GERD (gastroesophageal reflux disease) 02/03/2012  . Encounter for well adult exam with abnormal findings 10/26/2011  . Chronic anticoagulation 01/24/2011  . Chest pain 11/14/2010  . Other and unspecified coagulation defects 10/01/2010  . FATIGUE 06/20/2010  . Hyperlipidemia 05/09/2009  . GLAUCOMA 05/09/2009  . Edema 05/09/2009  . Hypothyroidism following radioiodine therapy 07/04/2008  . OSTEOPENIA 06/18/2008  . Nonspecific (abnormal) findings on radiological and other examination of body structure 12/02/2007  . Abnormal breath sounds 12/02/2007  . GOITER, MULTINODULAR 10/07/2007  . Essential hypertension 10/07/2007  . PERICARDITIS, CONSTRICTIVE 10/07/2007  . ATRIAL FIBRILLATION, PAROXYSMAL 10/07/2007  . HOARSENESS 10/07/2007    Past Surgical History:  Procedure Laterality Date  . ABDOMINAL HYSTERECTOMY  1992  . PERICARDIAL WINDOW  2002  . Radioactive Iodine for hyperthyroidism/Multinodular goiter     s/p  . TEMPORARY PACEMAKER N/A 08/05/2019   Procedure: TEMPORARY  PACEMAKER;  Surgeon: Belva Crome, MD;  Location: [facility];  Service: Cardiovascular;  Laterality: N/A;  . VIDEO BRONCHOSCOPY Bilateral 01/21/2018   Procedure: VIDEO BRONCHOSCOPY WITH FLUORO;  Surgeon: Tanda Rockers, MD;  Location: [facility];  Service: Cardiopulmonary;  Laterality: Bilateral;  . VIDEO BRONCHOSCOPY WITH  ENDOBRONCHIAL NAVIGATION Right 02/11/2018   Procedure: VIDEO BRONCHOSCOPY WITH ENDOBRONCHIAL NAVIGATION;  Surgeon: Collene Gobble, MD;  Location: [facility];  Service: Thoracic;  Laterality: Right;  Marland Kitchen VIDEO BRONCHOSCOPY WITH ENDOBRONCHIAL ULTRASOUND Right 02/11/2018   Procedure: VIDEO BRONCHOSCOPY WITH ENDOBRONCHIAL ULTRASOUND;  Surgeon: Collene Gobble, MD;  Location: [facility];  Service: Thoracic;  Laterality: Right;     OB History   No obstetric history on file.      Home Medications    Prior to Admission medications   Medication Sig Start Date End Date Taking? Authorizing Provider  albuterol (PROAIR HFA) 108 (90 Base) MCG/ACT inhaler Inhale 2 puffs into the lungs every 6 (six) hours as needed for wheezing or shortness of breath. 10/08/18   Tanda Rockers, MD  apixaban (ELIQUIS) 5 MG TABS tablet Take 1 tablet (5 mg total) by mouth 2 (two) times daily. 04/16/19   Fay Records, MD  brimonidine (ALPHAGAN) 0.2 % ophthalmic solution Place 1 drop into the left eye 3 (three) times daily.    [provider]  Cholecalciferol (VITAMIN D) 1000 UNITS capsule Take 1,000 Units by mouth daily.      [provider]  dorzolamide-timolol (COSOPT) 22.3-6.8 MG/ML ophthalmic solution Place 1 drop into both eyes 2 (two) times daily.  11/02/11   [provider]  famotidine (PEPCID) 20 MG tablet TAKE 1 TABLET BY MOUTH EVERYDAY AT BEDTIME 04/22/19   Biagio Borg, MD  furosemide (LASIX) 80 MG tablet Take 1 tablet (80 mg total) by mouth 2 (two) times daily. 04/08/19   Biagio Borg, MD  levothyroxine (SYNTHROID) 75 MCG tablet TAKE 1 TABLET BY MOUTH EVERY DAY 06/28/19   Renato Shin, MD  pantoprazole (PROTONIX) 40 MG tablet Take 1 tablet (40 mg total) by mouth daily. Take 30-60 min before first meal of the day 04/22/19   Biagio Borg, MD  pilocarpine [facility]) 1 % ophthalmic solution Place 1 drop into the left eye 3 (three) times daily.    [provider]  potassium chloride (KLOR-CON 10) 10  MEQ tablet Take 1 tablet (10 mEq total) by mouth daily. 05/18/19   Biagio Borg, MD  predniSONE (DELTASONE) 5 MG tablet TAKE 3 TABLETS (15 MG TOTAL) BY MOUTH DAILY WITH BREAKFAST. 05/03/19   Tanda Rockers, MD  TOPROL XL 50 MG 24 hr tablet TAKE 1 TABLET BY MOUTH EVERY DAY WITH OR IMMEDIATELY FOLLOWING A MEAL. 07/23/19   Fay Records, MD  Travoprost, BAK Free, (TRAVATAN) 0.004 % SOLN ophthalmic solution Place 1 drop into both eyes at bedtime.    [provider]  vitamin B-12 (CYANOCOBALAMIN) 500 MCG tablet Take 500 mcg by mouth daily.    [provider]  vitamin k 100 MCG tablet Take 20 tablets (2,000 mcg total) by mouth daily. 04/22/19   Janith Lima, MD    Family History Family History  Problem Relation Age of Onset  . Asthma Brother   . Arthritis Mother   . Arthritis Father   . Cervical cancer Sister   . Cancer Sister        Cervical Cancer    Social History Social History  Tobacco Use  . Smoking status: Former Smoker    Packs/day: 0.50    Years: 21.00    Pack years: 10.50    Types: Cigarettes    Quit date: 10/21/1978    Years since quitting: 40.8  . Smokeless tobacco: Never Used  Substance Use Topics  . Alcohol use: No  . Drug use: No     Allergies   Patient has no known allergies.   Review of Systems Review of Systems  Unable to perform ROS: Patient unresponsive     Physical Exam Updated Vital Signs BP (!) 84/69   Pulse (!) 44   Temp (!) 90.7 F (32.6 C)   Resp (!) 4   Ht 5' 6" (1.676 m)   Wt 93.8 kg   SpO2 (!) 13%   BMI 33.38 kg/m   Physical Exam Vitals signs and nursing note reviewed.  Constitutional:      Appearance: She is ill-appearing.     Comments: GCS 3  HENT:     Head: Atraumatic.  Eyes:     Comments: Pupils are 2 mm and equal  Cardiovascular:     Rate and Rhythm: Bradycardia present.  Pulmonary:     Comments: Spontaneous respirations appreciated, patient is being bagged Abdominal:     Palpations: Abdomen is  soft.     Tenderness: There is no abdominal tenderness.  Skin:    General: Skin is warm.  Neurological:     Comments: GCS 3      ED Treatments / Results  Labs (all labs ordered are listed, but only abnormal results are displayed) Labs Reviewed  CBC - Abnormal; Notable for the following components:      Result Value   WBC 3.1 (*)    RBC 1.59 (*)    Hemoglobin 5.8 (*)    HCT 18.4 (*)    MCV 115.7 (*)    MCH 36.5 (*)    RDW 18.0 (*)    Platelets 51 (*)    nRBC 0.6 (*)    All other components within normal limits  TSH - Abnormal; Notable for the following components:   TSH 0.239 (*)    All other components within normal limits  LACTIC ACID, PLASMA - Abnormal; Notable for the following components:   Lactic Acid, Venous 4.3 (*)    All other components within normal limits  LACTIC ACID, PLASMA - Abnormal; Notable for the following components:   Lactic Acid, Venous 9.5 (*)    All other components within normal limits  PROTIME-INR - Abnormal; Notable for the following components:   Prothrombin Time >90.0 (*)    INR >10.0 (*)    All other components within normal limits  APTT - Abnormal; Notable for the following components:   aPTT 87 (*)    All other components within normal limits  VITAMIN B12 - Abnormal; Notable for the following components:   Vitamin B-12 3,969 (*)    All other components within normal limits  IRON AND TIBC - Abnormal; Notable for the following components:   Saturation Ratios 33 (*)    All other components within normal limits  RETICULOCYTES - Abnormal; Notable for the following components:   Retic Ct Pct 3.9 (*)    RBC. 3.84 (*)    Immature Retic Fract 34.3 (*)    All other components within normal limits  T4, FREE - Abnormal; Notable for the following components:   Free T4 3.35 (*)    All other components within normal limits  COMPREHENSIVE METABOLIC PANEL - Abnormal; Notable for the following components:   Sodium 132 (*)    CO2 10 (*)     Glucose, Bld 63 (*)    BUN 36 (*)    Creatinine, Ser 2.52 (*)    Albumin 2.7 (*)    AST 133 (*)    Total Bilirubin 7.0 (*)    GFR calc non Af Amer 17 (*)    GFR calc Af Amer 20 (*)    Anion gap 20 (*)    All other components within normal limits  LACTIC ACID, PLASMA - Abnormal; Notable for the following components:   Lactic Acid, Venous 10.0 (*)    All other components within normal limits  APTT - Abnormal; Notable for the following components:   aPTT 48 (*)    All other components within normal limits  URINALYSIS, ROUTINE W REFLEX MICROSCOPIC - Abnormal; Notable for the following components:   Color, Urine AMBER (*)    APPearance CLOUDY (*)    Hgb urine dipstick SMALL (*)    Protein, ur >=300 (*)    Bacteria, UA MANY (*)    All other components within normal limits  CBC - Abnormal; Notable for the following components:   MCV 105.4 (*)    MCH 35.2 (*)    RDW 18.9 (*)    Platelets 120 (*)    nRBC 1.4 (*)    All other components within normal limits  BASIC METABOLIC PANEL - Abnormal; Notable for the following components:   CO2 17 (*)    Glucose, Bld 62 (*)    BUN 36 (*)    Creatinine, Ser 2.79 (*)    GFR calc non Af Amer 15 (*)    GFR calc Af Amer 18 (*)    Anion gap 21 (*)    All other components within normal limits  PHOSPHORUS - Abnormal; Notable for the following components:   Phosphorus 9.7 (*)    All other components within normal limits  PROTIME-INR - Abnormal; Notable for the following components:   Prothrombin Time 50.0 (*)    INR 5.6 (*)    All other components within normal limits  PROTIME-INR - Abnormal; Notable for the following components:   Prothrombin Time 40.8 (*)    INR 4.3 (*)    All other components within normal limits  CBC WITH DIFFERENTIAL/PLATELET - Abnormal; Notable for the following components:   RBC 3.84 (*)    MCV 114.3 (*)    MCH 35.4 (*)    RDW 19.9 (*)    Platelets 112 (*)    nRBC 1.9 (*)    All other components within normal  limits  COMPREHENSIVE METABOLIC PANEL - Abnormal; Notable for the following components:   Chloride 97 (*)    CO2 15 (*)    Glucose, Bld 115 (*)    BUN 36 (*)    Creatinine, Ser 2.94 (*)    Total Protein 6.1 (*)    Albumin 2.6 (*)    AST 578 (*)    ALT 146 (*)    Total Bilirubin 8.6 (*)    GFR calc non Af Amer 14 (*)    GFR calc Af Amer 16 (*)    Anion gap 24 (*)    All other components within normal limits  POC OCCULT BLOOD, ED - Abnormal; Notable for the following components:   Fecal Occult Bld POSITIVE (*)    All other components within normal limits  POCT I-STAT 7, (  LYTES, BLD GAS, ICA,H+H) - Abnormal; Notable for the following components:   pH, Arterial 7.214 (*)    pCO2 arterial 18.1 (*)    pO2, Arterial 60.0 (*)    Bicarbonate 7.6 (*)    TCO2 8 (*)    Acid-base deficit 19.0 (*)    Sodium 130 (*)    Calcium, Ion 1.10 (*)    Hemoglobin 15.6 (*)    All other components within normal limits  POCT I-STAT 7, (LYTES, BLD GAS, ICA,H+H) - Abnormal; Notable for the following components:   pH, Arterial 7.168 (*)    pO2, Arterial 26.0 (*)    Bicarbonate 16.8 (*)    TCO2 18 (*)    Acid-base deficit 13.0 (*)    Calcium, Ion 1.06 (*)    Hemoglobin 15.6 (*)    All other components within normal limits  POCT I-STAT 7, (LYTES, BLD GAS, ICA,H+H) - Abnormal; Notable for the following components:   pH, Arterial 7.059 (*)    pCO2 arterial 31.4 (*)    pO2, Arterial 22.0 (*)    Bicarbonate 9.2 (*)    TCO2 10 (*)    Acid-base deficit 21.0 (*)    Sodium 134 (*)    Potassium <2.0 (*)    Calcium, Ion 0.71 (*)    HCT 32.0 (*)    Hemoglobin 10.9 (*)    All other components within normal limits  TROPONIN I (HIGH SENSITIVITY) - Abnormal; Notable for the following components:   Troponin I (High Sensitivity) 1,456 (*)    All other components within normal limits  SARS CORONAVIRUS 2 BY RT PCR (HOSPITAL ORDER, [place] [facility])  MRSA PCR SCREENING  CULTURE, BLOOD  (ROUTINE X 2)  CULTURE, BLOOD (ROUTINE X 2)  URINE CULTURE  URINE CULTURE  FERRITIN  TSH  CORTISOL  MAGNESIUM  FOLATE  GLUCOSE, CAPILLARY  T3  BLOOD GAS, ARTERIAL  I-STAT ARTERIAL BLOOD GAS, ED  TYPE AND SCREEN  PREPARE RBC (CROSSMATCH)  ABO/RH  PREPARE FRESH FROZEN PLASMA  PREPARE FRESH FROZEN PLASMA    EKG EKG Interpretation  Date/Time:  Wednesday August 11 2019 19:50:31 EDT Ventricular Rate:  44 PR Interval:    QRS Duration: 87 QT Interval:  619 QTC Calculation: 530 R Axis:   112 Text Interpretation:  Sinus or ectopic atrial bradycardia Atrial premature complex Probable right ventricular hypertrophy Borderline abnrm T, anterolateral leads Prolonged QT interval No acute changes Confirmed by Varney Biles ([id number]) on 07/23/2019 9:48:37 PM   Radiology Dg Chest Port 1 View  Result Date: Aug 26, 2019 CLINICAL DATA:  Central line placement. EXAM: PORTABLE CHEST 1 VIEW COMPARISON:  Radiograph yesterday at 2149 hour FINDINGS: Tip of the left internal jugular central venous catheter is in the upper SVC. No pneumothorax. Endotracheal tube tip 3.8 cm from the carina. Enteric tube in place with tip below the diaphragm not included in the field of view. Epicardial lead. Interval worsening of diffuse airspace disease. Possible right pleural effusion. Cardiomegaly is unchanged. IMPRESSION: 1. Tip of the left internal jugular central venous catheter in the upper SVC. No pneumothorax. 2. Interval worsening of diffuse airspace disease, pulmonary edema favored over multifocal pneumonia. 3. Possible increasing right pleural effusion. Electronically Signed   By: Keith Rake M.D.   On: Aug 26, 2019 02:50   Dg Chest Port 1 View  Result Date: 08/17/2019 CLINICAL DATA:  Repositioning of endotracheal tube. EXAM: PORTABLE CHEST 1 VIEW 9:49 p.m. COMPARISON:  08/17/2019 at 8:26 p.m. FINDINGS: Endotracheal tube tip is now  4.5 cm above the carina in good position. NG tube tip is below the diaphragm.  Cardiomegaly. Mild interstitial edema, essentially unchanged. No discrete effusion. Aortic atherosclerosis. IMPRESSION: 1. Endotracheal tube tip is 4.5 cm above the carina, in good position. 2. Stable mild interstitial edema. 3. Aortic atherosclerosis. Electronically Signed   By: Lorriane Shire M.D.   On: 08/18/2019 22:02   Dg Chest Port 1 View  Result Date: 07/25/2019 CLINICAL DATA:  Intubation EXAM: PORTABLE CHEST 1 VIEW COMPARISON:  July 21, 2019 FINDINGS: Endotracheal tube is just entering the right mainstem bronchus. NG tube is seen coursing below the diaphragm. There is coarsened interstitial markings throughout both lungs. There is marked cardiomegaly. Aortic knob calcifications. IMPRESSION: Endotracheal tube tip just entering the right mainstem bronchus and could be retracted approximately 2 cm. Cardiomegaly and interstitial edema. These results were called by telephone at the time of interpretation on 08/09/2019 at 8:59 pm to provider [facility] , who verbally acknowledged these results. Electronically Signed   By: Prudencio Pair M.D.   On: 08/05/2019 20:59    Procedures Procedure Name: Intubation Date/Time: 08-19-2019 3:48 PM Performed by: Varney Biles, MD Pre-anesthesia Checklist: Patient identified, Patient being monitored, Emergency Drugs available, Timeout performed and Suction available Oxygen Delivery Method: Non-rebreather mask Preoxygenation: Pre-oxygenation with 100% oxygen Induction Type: Rapid sequence Ventilation: Mask ventilation without difficulty Laryngoscope Size: Glidescope and 3 Grade View: Grade I Placement Confirmation: ETT inserted through vocal cords under direct vision,  CO2 detector and Breath sounds checked- equal and bilateral Secured at: 24 cm Tube secured with: ETT holder    OG placement  Date/Time: August 19, 2019 3:49 PM Performed by: Varney Biles, MD Authorized by: Varney Biles, MD  Consent: The procedure was performed in an emergent  situation. Patient identity confirmed: arm band Time out: Immediately prior to procedure a "time out" was called to verify the correct patient, procedure, equipment, support staff and site/side marked as required. Local anesthesia used: no  Anesthesia: Local anesthesia used: no  Sedation: Patient sedated: no  Patient tolerance: patient tolerated the procedure well with no immediate complications  .Critical Care Performed by: Varney Biles, MD Authorized by: Varney Biles, MD   Critical care provider statement:    Critical care time (minutes):  68   Critical care was necessary to treat or prevent imminent or life-threatening deterioration of the following conditions:  Circulatory failure, cardiac failure and shock   Critical care was time spent personally by me on the following activities:  Discussions with consultants, evaluation of patient's response to treatment, examination of patient, ordering and performing treatments and interventions, ordering and review of laboratory studies, ordering and review of radiographic studies, pulse oximetry, re-evaluation of patient's condition, obtaining history from patient or surrogate and review of old charts   I assumed direction of critical care for this patient from another provider in my specialty: yes   Ultrasound ED Echo  Date/Time: 2019/08/19 3:50 PM Performed by: Varney Biles, MD Authorized by: Varney Biles, MD   Procedure details:    Indications: hypotension     Views: subxiphoid, parasternal long axis view and parasternal short axis view     Images: not archived     Limitations:  Body habitus and positioning (pad placement) Findings:    Pericardium: moderate pericardial effusion     LV Function: severly depressed (<30%)   Impression:    Impression: pericardial effusion present and decreased contractility   External pacer  Date/Time: 2019-08-19 3:53 PM Performed by: Varney Biles, MD Authorized by:  Varney Biles,  MD  Consent: The procedure was performed in an emergent situation. Patient identity confirmed: arm band Time out: Immediately prior to procedure a "time out" was called to verify the correct patient, procedure, equipment, support staff and site/side marked as required. Local anesthesia used: no  Anesthesia: Local anesthesia used: no  Sedation: Patient sedated: no  Patient tolerance: patient tolerated the procedure well with no immediate complications    (including critical care time)  Medications Ordered in ED Medications  sodium chloride flush (NS) 0.9 % injection 3 mL ( Intravenous MAR Unhold 07/27/2019 2354)  midazolam (VERSED) bolus via infusion 1-2 mg ( Intravenous MAR Unhold 07/26/2019 2354)  EPINEPHrine (ADRENALIN) 4 mg in NS 250 mL (0.016 mg/mL) premix infusion (0 mcg/min Intravenous Stopped Aug 29, 2019 1440)  0.9 %  sodium chloride infusion ( Intravenous MAR Unhold 08/19/2019 2354)  pantoprazole (PROTONIX) injection 40 mg (40 mg Intravenous Given 2019-08-29 0954)  fentaNYL (SUBLIMAZE) injection 25 mcg (has no administration in time range)  fentaNYL (SUBLIMAZE) injection 25-100 mcg (has no administration in time range)  norepinephrine (LEVOPHED) 42m in 2538mpremix infusion (0 mcg/min Intravenous Stopped 102020/11/08440)  Chlorhexidine Gluconate Cloth 2 % PADS 6 each (has no administration in time range)  sodium chloride flush (NS) 0.9 % injection 10-40 mL (30 mLs Intracatheter Given 1011-08-20945)  sodium chloride flush (NS) 0.9 % injection 10-40 mL (has no administration in time range)  Chlorhexidine Gluconate Cloth 2 % PADS 6 each (0 each Topical Duplicate 1041/96/[id number] chlorhexidine gluconate (MEDLINE KIT) (PERIDEX) 0.12 % solution 15 mL (15 mLs Mouth Rinse Given 102020/11/08800)  MEDLINE mouth rinse (15 mLs Mouth Rinse Given 1011/08/20205)  Thrombi-Pad 3"X3" pad 1 each (0 each Topical Hold 10November 08, 2020347)  glycopyrrolate (ROBINUL) tablet 1 mg (has no administration in time range)     Or  glycopyrrolate (ROBINUL) injection 0.2 mg (has no administration in time range)    Or  glycopyrrolate (ROBINUL) injection 0.2 mg (has no administration in time range)  polyvinyl alcohol (LIQUIFILM TEARS) 1.4 % ophthalmic solution 1 drop (has no administration in time range)  fentaNYL (SUBLIMAZE) injection 50 mcg (has no administration in time range)  fentaNYL (SUBLIMAZE) bolus via infusion 100 mcg (has no administration in time range)  fentaNYL 250051min NS 250m43m0mc30m) infusion-PREMIX (0 mcg/hr Intravenous Stopped 07/2210/08/20)  haloperidol lactate (HALDOL) injection 2.5-5 mg (has no administration in time range)  atropine injection (0.5 mg Intravenous Given 07/27/2019 1949)  etomidate (AMIDATE) injection (20 mg Intravenous Given 08/18/2019 1956)  rocuronium (ZEMURON) injection (70 mg Intravenous Given 08/09/2019 1957)  fentaNYL (SUBLIMAZE) injection 25 mcg (25 mcg Intravenous Given 08/10/2019 2027)  ceFEPIme (MAXIPIME) 2 g in sodium chloride 0.9 % 100 mL IVPB (0 g Intravenous Stopped 08/19/2019 2256)  EPINEPHrine (ADRENALIN) 1 MG/10ML injection (1 mg Intravenous Given 08/10/2019 2228)  0.9 %  sodium chloride infusion (Manually program via Guardrails IV Fluids) ( Intravenous New Bag/Given 07/2210-08-20)  sodium bicarbonate 1 mEq/mL injection (50 mEq  Given 07/2210/08/20)  norepinephrine (LEVOPHED) 4-5 MG/250ML-% infusion SOLN (4 mg  New Bag/Given 10/222020/11/08)  phytonadione (VITAMIN K) 5 mg in dextrose 5 % 50 mL IVPB ( Intravenous Stopped 07/2210-08-20)  glucagon (human recombinant) (GLUCAGEN) 1 MG injection (1 mg  Given 07/2210-05-2019)  sodium bicarbonate injection 150 mEq (150 mEq Intravenous Given 07/2207-Nov-2020)  phytonadione (VITAMIN K) 5 mg in dextrose 5 % 50 mL IVPB ( Intravenous Stopped 10/22Nov 08, 2020)  potassium chloride 10 mEq in 50  mL *CENTRAL LINE* IVPB ( Intravenous Stopped 08/30/2019 0933)  lidocaine (PF) (XYLOCAINE) 1 % injection (has no administration in time range)  Heparin  (Porcine) in NaCl 1000-0.9 UT/500ML-% SOLN (has no administration in time range)   (has no administration in time range)     Initial Impression / Assessment and Plan / ED Course  I have reviewed the triage vital signs and the nursing notes.  Pertinent labs & imaging results that were available during my care of the patient were reviewed by me and considered in my medical decision making (see chart for details).        Patient arrives to the ER unresponsive with GCS of 3.  She has history of CHF, A. fib on Eliquis, constrictive pericarditis, thyroid disease.  She was allegedly lethargic and hypoglycemic when EMS first contacted her.  On the route to the ED patient became bradycardic and unresponsive.  She never lost her pulse.  In the ED patient was switched over to our pads. She was having spontaneous respirations but her GCS remained 3.  Initial blood pressure was in the 16W systolic.  Blood glucose was 100.  She was given 0.5 mg of atropine x2 with transient improvement in the heart rate.  At this time decision was made to intubate the patient and start her on epi drip for her bradycardia.  We also decided to start transcutaneous pacing in the ED.  Rhythm strip at no point did show show any clear evidence of ischemia.  After intubation cardiology service was consulted.  Differential diagnoses cardiogenic shock secondary to sick sinus syndrome.  Other possibilities in fluid ACS, intracranial bleed, adrenal insufficiency, myxedema coma.  Electrolyte abnormality with hypokalemia, and organ failure.  Initial labs indicated INR over 10.  Hemoglobin was 0.6.  She did not have any melena or bright red blood per rectum therefore we did not initiate reversal for Eliquis.  We also do not know if she was indeed still taking Eliquis and was.  On ABG, electrolytes appeared reassuring but her hemoglobin was noted as 15.  We did initiate 1 unit of PRBC transfusion but ordered a repeat CBC.   Bedside  echocardiogram by me revealed pericardial effusion but no tamponade.  There was global dyskinesia and reduced EF.  Critical care service was consulted.  We discussed with him the possibility of adrenal insufficiency and hypothyroidism.  Labs were pending at that time.  We did decide to start antibiotics for the undifferentiated shock.  Cardiology team initially thought that patient needed further investigation before she was taken to the Cath Lab for pacemaker, but subsequently they decided to take her to the Cath Lab for permanent pacing.   Final Clinical Impressions(s) / ED Diagnoses   Final diagnoses:  Cardiogenic shock ([place])  Bradycardia  Severe anemia    ED Discharge Orders    None       Varney Biles, MD August 30, 2019 1551    Varney Biles, MD 08/30/19 1553

## 2019-08-22 NOTE — Progress Notes (Addendum)
eLink Physician-Brief Progress Note Patient Name: Monica Mora DOB: 1935/01/05 MRN: [id number]   Date of Service  08-13-2019  HPI/Events of Note  Pt found unresponsive, altered, bradycardic and hypoglycemic at home by her niece, required D 74 and external pacing prior to arrival at [facility], briefly arrested at [facility] and received CPR, found to be profoundly coagulopathic and in complete heart block, went to the cath lab for placement of a transvenous pacemaker. Intubated in ED and on the ventilator.  eICU Interventions  New patient evaluation completed.        Kerry Kass Jenan Ellegood 13-Aug-2019, 12:33 AM

## 2019-08-22 NOTE — Progress Notes (Signed)
Nutrition Brief Note  Chart reviewed. Pt now transitioning to comfort care.  No further nutrition interventions warranted at this time.  Please re-consult as needed.   Mariana Single RD, LDN Clinical Nutrition Pager # [phone]

## 2019-08-22 NOTE — Discharge Summary (Signed)
DISCHARGE NOTE AND DEATH SUMMARY   Patient Details  Name: Monica Mora MRN: [id number] DOB: 05/22/1935  Admission/Discharge Information   Admit Date:  08/19/2019  Date of Death: Date of Death: 08-20-2019  Time of Death: Time of Death: 90  Length of Stay: 1  Referring Physician: Biagio Borg, MD   Reason(s) for Hospitalization  Cardiogenic Shock with Complete Heart Block  Diagnoses  Preliminary cause of death:  Secondary Diagnoses (including complications and co-morbidities):  Active Problems:   CHB (complete heart block) (HCC)   Persistent severe sinus bradycardia   Hypotension   Cardiogenic shock [facility])   Brief Hospital Course (including significant findings, care, treatment, and services provided and events leading to death)  Monica Mora is a 84 y.o. year old female  w/ PMHx Afib on Eliquis, HFpEF (Last ECHO in May 2019 EF 55-60%), Pulm HTN (PA peak pressure: 102 mm Hg w/ D-shaped interventricular septum on last ECHO), Constrictive pericarditis, Glaucoma, cataracts (s/p recent surgery on eye drops), HLD, Non toxic multinodular goiter w/ hypothyroidism (on synthroid) presented to [facility] on Aug 19, 2023 after being found down at home. per the patient's niece (who is HCPOA) patient lives by herself and is able to perform her IADLs and ADLs.  She was in usual health on 08/19/23 when she was found down at home by her niece.  EMS was called, and upon their arrival she remained unresponsive.  She was immediately identified to be profoundly bradycardic with heart rate in the 30s.  Glucose also low.  Dextrose was administered and she was transcutaneously paced in route to the emergency department where she was emergently intubated on presentation.  She remained bradycardic and in shock requiring ongoing transcutaneous pacing.  Found to be in complete heart block.  There was a period where the pacer failed to capture and she briefly underwent CPR and received epinephrine prompting ventricular  tachycardia.  She was defibrillated back to complete heart block.  She was taken emergently to the cardiac Cath Lab for transvenous pacemaker wire placed.  PCCM evaluated and admitted patient.  PCCM Assessment and Plan:  1. Symptomatic Bradycardia (Complete Heart Block): on my evaluation pt was being paced externally at an extrinsic rate of 74 bpm>> when the pacer is off her intrinsic rate is between 30-40 bpm.  Pt has a h/o being on beta blockers at home>> can not r/o possible unintentional overdose, can not be attributed to thyroid as TSH was low and pt is on synthroid indicating hyperthyroidism :  Plan: Glucagon IV, continue on Epi gtt, hold all home beta blockers. Continue on cardiac monitoring. Cardiology is following and has taken pt to cath lab for TVP placement will evaluate post.  2. Cardiogenic Shock>>secondary to bradycardia and severe RV failure: Pt has severe PH on review of past ECHO w/ D shaped septum and RV overload. PAP were 102 mmHg. Plan: continue on Epi gtt to keep MAP >49mmhg. Rate control with TVP. F/u ECHO 3. Coagulopathy: working theory is pt unintentionally overdosed on her Eliquis. INR >10 on presentation rpt INR pending. Prior to cath lab no FFP or reversal was given. Plan: post cath lab will give 2 FFP, hold Eliquis and repeat INR>> Goal is to get INR 2-3. May need Vitamin K. If actively bleeding will consider reversal with Adnexxa. Given Supra-therapeutic INR pt is at increased risk of ICH. Pt has a h/o falls>> when hemodynamically stable CTH w/o contrast. 4. Acute resp failure with hypoxia: Pt has pulmonary HTN, ILD, RLL mass, ?  GIB Anemic on presentation and recent cardiogenic shock so multiple reasons for hypoxia and hypoxemia.  Plan: keep Hgb >7 with transfusion of PRBCs, continue on PRVC elevated PEEP for oxygenation, discussed with cardiology started on inhaled NO given severe PH. Will get a repeat ABG 1 hr post starting. 5. Anion Gap Metabolic Acidosis: secondary to lactic  acidosis from hypoperfusion, given Bicarb pushes and started on IV Bicarb gtt. Initial ph post cath 6.9>> Plan: continue Bicarb, MAP goal >65 mmHg hopefully get some forward flow with vasopressors and vasodilation of the pulm circulation to improve CO.  6. H/o Hypothyroidism on Synthroid at home TSH 0.2 and Free T4 3.35 actually hyperthyroid on labs.  Pt may have been taking increased doses of Synthroid. Plan: Hold synthroid.  7. AKI secondary to Cardiorenal. Plan: Map goal > 64mmHg. Avoid nephrotoxic meds. Monitor UOP.  8. Anemia thought to be secondary to GIB>> Plan: Protonix BID, Goal Hgb >7 start transfusion of PRBCs. Reverse INR with FFP and Vitamin K.  9. Acute encephalopathy: cardiogenic shock, plus supra-therapeutic INR. Currently on prn sedation only.  Plan: Continue Neurochecks. If hemodynamically stable CTH w/o contrast   Cardiology took pt to Cath lab for placement of TVP. Post placement pt was hypotensive and  Hypoxic. Pt was noted to be in severe RSHF secondary to severe Pulmonary HTN.  She was started on inhaled NO at 20 ppm which was titrated up to 30 ppm.  She continued on multiple vasopressors for BP support. Pt required no sedation throughout her admission.  Given her INR >10 there was suspicion for ICH and pt was reversed with FFP and Vitamin K. Despite aggressive critical care management and interventions she failed to respond to therapies. Family was informed of this and they made the pt DNR but wanted to continue care.  Pt was reevaluated later with worsening hemodynamics and at that time family decided to withdraw care and switch to a goal of comfort.     Pertinent Labs and Studies  Significant Diagnostic Studies Dg Chest 2 View  Result Date: 07/21/2019 CLINICAL DATA:  84 year old female with shortness of breath and near syncope. EXAM: CHEST - 2 VIEW COMPARISON:  04/29/2018 chest radiographs. Chest CT 06/16/2018. FINDINGS: Upright AP and lateral views at 1946 hours. Stable  cardiomegaly and mediastinal contours. Stable lung volumes. Chronic increased interstitial markings in both lungs, stable since 2019. Chronic right lower lobe collapse or consolidation has not significantly changed. No superimposed pneumothorax, pleural effusion or new pulmonary opacity. Calcified aortic atherosclerosis. No acute osseous abnormality identified. Negative visible bowel gas pattern. IMPRESSION: 1. Stable chest with chronic right lower lobe collapse or consolidation and chronic pulmonary interstitial changes. No superimposed acute findings are identified. 2. Mild cardiomegaly.  Aortic Atherosclerosis (ICD10-I70.0). Electronically Signed   By: Genevie Ann M.D.   On: 07/21/2019 20:14   Dg Chest Port 1 View  Result Date: 2019/08/23 CLINICAL DATA:  Central line placement. EXAM: PORTABLE CHEST 1 VIEW COMPARISON:  Radiograph yesterday at 2149 hour FINDINGS: Tip of the left internal jugular central venous catheter is in the upper SVC. No pneumothorax. Endotracheal tube tip 3.8 cm from the carina. Enteric tube in place with tip below the diaphragm not included in the field of view. Epicardial lead. Interval worsening of diffuse airspace disease. Possible right pleural effusion. Cardiomegaly is unchanged. IMPRESSION: 1. Tip of the left internal jugular central venous catheter in the upper SVC. No pneumothorax. 2. Interval worsening of diffuse airspace disease, pulmonary edema favored over multifocal pneumonia. 3.  Possible increasing right pleural effusion. Electronically Signed   By: Keith Rake M.D.   On: 08-Sep-2019 02:50   Dg Chest Port 1 View  Result Date: 08/13/2019 CLINICAL DATA:  Repositioning of endotracheal tube. EXAM: PORTABLE CHEST 1 VIEW 9:49 p.m. COMPARISON:  07/29/2019 at 8:26 p.m. FINDINGS: Endotracheal tube tip is now 4.5 cm above the carina in good position. NG tube tip is below the diaphragm. Cardiomegaly. Mild interstitial edema, essentially unchanged. No discrete effusion. Aortic  atherosclerosis. IMPRESSION: 1. Endotracheal tube tip is 4.5 cm above the carina, in good position. 2. Stable mild interstitial edema. 3. Aortic atherosclerosis. Electronically Signed   By: Lorriane Shire M.D.   On: 08/21/2019 22:02   Dg Chest Port 1 View  Result Date: 08/13/2019 CLINICAL DATA:  Intubation EXAM: PORTABLE CHEST 1 VIEW COMPARISON:  July 21, 2019 FINDINGS: Endotracheal tube is just entering the right mainstem bronchus. NG tube is seen coursing below the diaphragm. There is coarsened interstitial markings throughout both lungs. There is marked cardiomegaly. Aortic knob calcifications. IMPRESSION: Endotracheal tube tip just entering the right mainstem bronchus and could be retracted approximately 2 cm. Cardiomegaly and interstitial edema. These results were called by telephone at the time of interpretation on 08/20/2019 at 8:59 pm to provider [facility] , who verbally acknowledged these results. Electronically Signed   By: Prudencio Pair M.D.   On: 08/19/2019 20:59    Microbiology Recent Results (from the past 240 hour(s))  Urine culture     Status: None   Collection Time: 07/31/2019  3:07 AM   Specimen: In/Out Cath Urine  Result Value Ref Range Status   Specimen Description IN/OUT CATH URINE  Final   Special Requests NONE  Final   Culture   Final    NO GROWTH Performed at [facility], [address], [place], [place] [address]    Report Status 09/08/19 FINAL  Final  SARS Coronavirus 2 by RT PCR (hospital order, performed in [facility] hospital lab) Nasopharyngeal Nasopharyngeal Swab     Status: None   Collection Time: 08/21/2019  7:50 PM   Specimen: Nasopharyngeal Swab  Result Value Ref Range Status   SARS Coronavirus 2 NEGATIVE NEGATIVE Final    Comment: (NOTE) If result is NEGATIVE SARS-CoV-2 target nucleic acids are NOT DETECTED. The SARS-CoV-2 RNA is generally detectable in upper and lower  respiratory specimens during the acute phase of infection. The  lowest  concentration of SARS-CoV-2 viral copies this assay can detect is 250  copies / mL. A negative result does not preclude SARS-CoV-2 infection  and should not be used as the sole basis for treatment or other  patient management decisions.  A negative result may occur with  improper specimen collection / handling, submission of specimen other  than nasopharyngeal swab, presence of viral mutation(s) within the  areas targeted by this assay, and inadequate number of viral copies  (<250 copies / mL). A negative result must be combined with clinical  observations, patient history, and epidemiological information. If result is POSITIVE SARS-CoV-2 target nucleic acids are DETECTED. The SARS-CoV-2 RNA is generally detectable in upper and lower  respiratory specimens dur ing the acute phase of infection.  Positive  results are indicative of active infection with SARS-CoV-2.  Clinical  correlation with patient history and other diagnostic information is  necessary to determine patient infection status.  Positive results do  not rule out bacterial infection or co-infection with other viruses. If result is PRESUMPTIVE POSTIVE SARS-CoV-2 nucleic acids  MAY BE PRESENT.   A presumptive positive result was obtained on the submitted specimen  and confirmed on repeat testing.  While 2019 novel coronavirus  (SARS-CoV-2) nucleic acids may be present in the submitted sample  additional confirmatory testing may be necessary for epidemiological  and / or clinical management purposes  to differentiate between  SARS-CoV-2 and other Sarbecovirus currently known to infect humans.  If clinically indicated additional testing with an alternate test  methodology [phone]) is advised. The SARS-CoV-2 RNA is generally  detectable in upper and lower respiratory sp ecimens during the acute  phase of infection. The expected result is Negative. Fact Sheet for Patients:   StrictlyIdeas.no Fact Sheet for Healthcare Providers: BankingDealers.co.za This test is not yet approved or cleared by the [place] FDA and has been authorized for detection and/or diagnosis of SARS-CoV-2 by FDA under an Emergency Use Authorization (EUA).  This EUA will remain in effect (meaning this test can be used) for the duration of the COVID-19 declaration under Section 564(b)(1) of the Act, 21 U.S.C. section 360bbb-3(b)(1), unless the authorization is terminated or revoked sooner. Performed at [facility], [place] [address] [place], [place] [address]   Blood Culture (routine x 2)     Status: None (Preliminary result)   Collection Time: 09-05-19  1:04 AM   Specimen: BLOOD RIGHT HAND  Result Value Ref Range Status   Specimen Description BLOOD RIGHT HAND  Final   Special Requests   Final    BOTTLES DRAWN AEROBIC AND ANAEROBIC Blood Culture results may not be optimal due to an inadequate volume of blood received in culture bottles   Culture   Final    NO GROWTH 1 DAY Performed at [facility], [place] [address], [place], [place] [address]    Report Status PENDING  Incomplete  Blood Culture (routine x 2)     Status: None (Preliminary result)   Collection Time: 09/05/2019  1:10 AM   Specimen: BLOOD LEFT HAND  Result Value Ref Range Status   Specimen Description BLOOD LEFT HAND  Final   Special Requests   Final    BOTTLES DRAWN AEROBIC AND ANAEROBIC Blood Culture results may not be optimal due to an inadequate volume of blood received in culture bottles   Culture   Final    NO GROWTH 1 DAY Performed at [facility], [place] [address] [place], [place] [address]    Report Status PENDING  Incomplete  MRSA PCR Screening     Status: None   Collection Time: 09-05-19  2:26 AM   Specimen: Nasopharyngeal  Result Value Ref Range Status   MRSA by PCR NEGATIVE NEGATIVE Final    Comment:        The GeneXpert MRSA Assay  (FDA approved for NASAL specimens only), is one component of a comprehensive MRSA colonization surveillance program. It is not intended to diagnose MRSA infection nor to guide or monitor treatment for MRSA infections. Performed at [facility], [place] [address], [place], [place] [address]     Lab Basic Metabolic Panel: Recent Labs  Lab 08/02/2019 2124 Sep 05, 2019 0104 Sep 05, 2019 0121 09-05-2019 0225 09-05-19 0637  NA 132* 139 137 134* 136  K 4.9 4.0 3.6 <2.0* 4.0  CL 102 101  --   --  97*  CO2 10* 17*  --   --  15*  GLUCOSE 63* 62*  --   --  115*  BUN 36* 36*  --   --  36*  CREATININE 2.52* 2.79*  --   --  2.94*  CALCIUM 9.0 10.0  --   --  8.9  MG  --  2.2  --   --   --   PHOS  --  9.7*  --   --   --    Liver Function Tests: Recent Labs  Lab 08/20/2019 2124 09/04/19 0637  AST 133* 578*  ALT 37 146*  ALKPHOS 112 100  BILITOT 7.0* 8.6*  PROT 6.9 6.1*  ALBUMIN 2.7* 2.6*   No results for input(s): LIPASE, AMYLASE in the last 168 hours. No results for input(s): AMMONIA in the last 168 hours. CBC: Recent Labs  Lab 08/19/2019 1950 07/25/2019 2107 09-04-2019 0104 Sep 04, 2019 0121 09/04/19 0225 September 04, 2019 0637  WBC 3.1*  --  8.0  --   --  9.8  NEUTROABS  --   --   --   --   --  7.5  HGB 5.8* 15.6* 14.9 15.6* 10.9* 13.6  HCT 18.4* 46.0 44.6 46.0 32.0* 43.9  MCV 115.7*  --  105.4*  --   --  114.3*  PLT 51*  --  120*  --   --  112*   Cardiac Enzymes: No results for input(s): CKTOTAL, CKMB, CKMBINDEX, TROPONINI in the last 168 hours. Sepsis Labs: Recent Labs  Lab 08/09/2019 1950 08/10/2019 1958 2019-09-04 0104 Sep 04, 2019 0309 Sep 04, 2019 0637  WBC 3.1*  --  8.0  --  9.8  LATICACIDVEN  --  4.3* 9.5* 10.0*  --     Procedures/Operations  Endotracheally intubated in ED Started on Vasopressors Transcutaneously paced Cardioversion TVP placement Arterial Line Placement Initiation of inhaled nitric oxide   Cyril Mourning D Jenna Routzahn 08/04/2019, 2:24 AM

## 2019-08-22 NOTE — Progress Notes (Signed)
   Spoke to niece.  Pacer still working.  DNR status and awaiting Granddaughter  Arrival.  Will signoff.

## 2019-08-22 NOTE — Progress Notes (Signed)
Pt extubated using withdrawal guidelines.  RN @ bedside.

## 2019-08-22 NOTE — Progress Notes (Signed)
I discussed medical decisions and level of care regarding patient's condition with the  patient's family/surrogate as the patient is unable to participate at this time.  These discussions are necessary given the patient's continued deterioration and need for decision regarding continuation of mechanical ventilation, resuscitation and other interventions such as invasive procedures.  The family/surrogate has decided, in accordance with patient's wishes to discontinue medical therapy DO NOT RESUSCITATE patient and proceed with comfort measures.  DNR/comfort measures updated in EMR.  Francine Graven, MSN, AGACNP  [facility]

## 2019-08-22 DEATH — deceased

## 2019-09-03 IMAGING — DX DG CHEST 1V PORT
1 series · 1 of 1 positions shown · non-contrast
Comparison: 11/19/2017

CLINICAL DATA: Followup bronchoscopy and right lower lobe biopsy.

EXAM:
PORTABLE CHEST 1 VIEW

[chest ap]
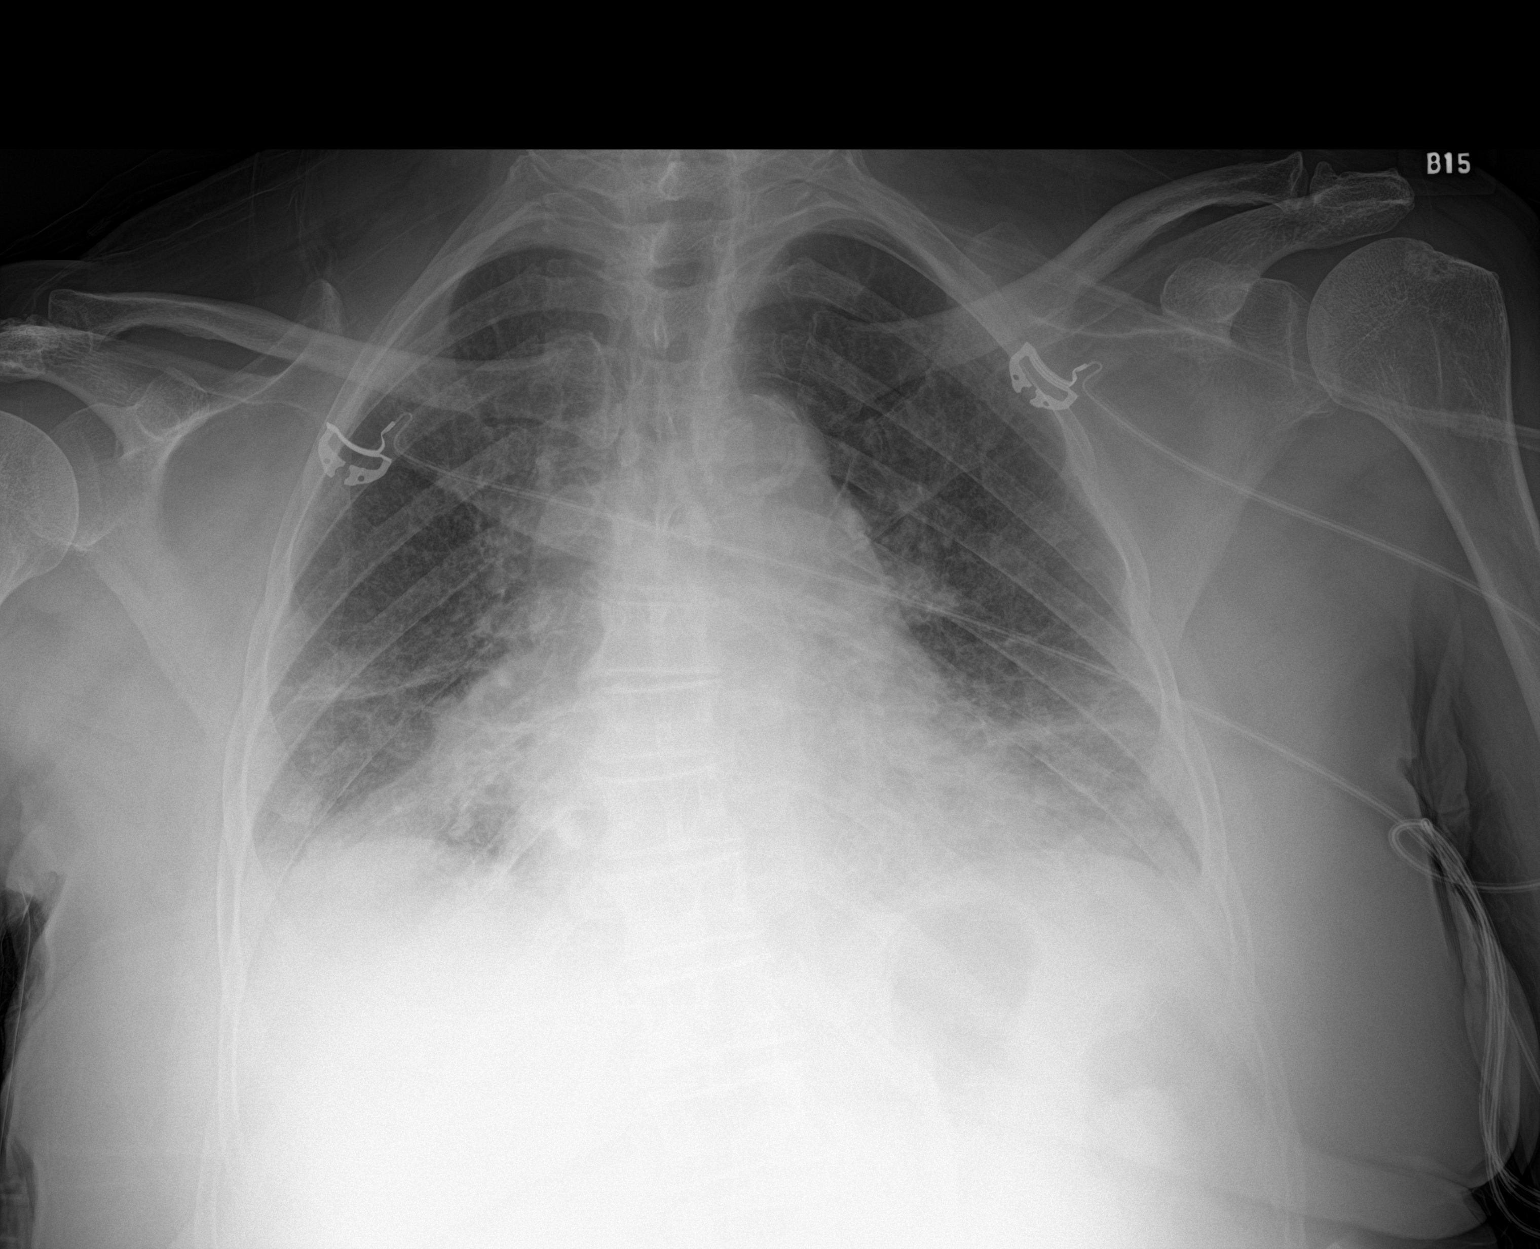

[1 of 1 positions shown; findings below may reference images not displayed]

FINDINGS: No pneumothorax on the right. Persistent and somewhat worsened
volume loss in the lower right lung. Mild atelectasis at the left
base now seen.
IMPRESSION: No pneumothorax following right sided bronchoscopy and biopsy. Some
worsening volume loss in the lower lungs right more than left.

## 2019-09-18 IMAGING — CT CT CHEST SUPER D W/O CM
2 of 4 series · 15 of 36 positions shown, 18 images · non-contrast
Comparison: CT chest 12/31/2017

CLINICAL DATA: Planning for bronchoscopy. Super D protocol. Right
lower lobe lung mass.

EXAM:
CT CHEST WITHOUT CONTRAST
TECHNIQUE: Multidetector CT imaging of the chest was performed using thin slice
collimation for electromagnetic bronchoscopy planning purposes,
without intravenous contrast.

[Series 4: thins · axial · 0.78mm/px · z∈[-300,-28]mm · 12 of 382 slices shown, 15 images]
[im 21/382  mediastinal]
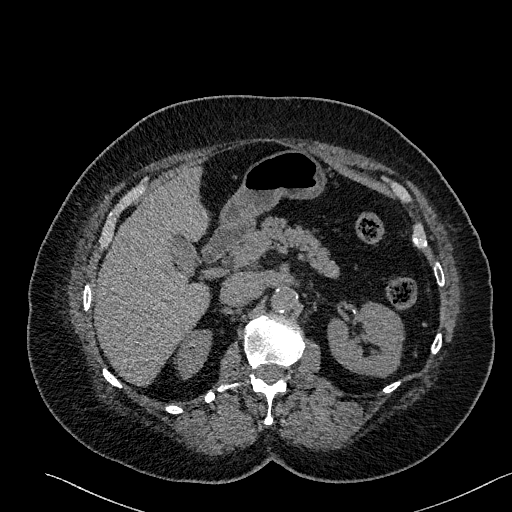
[im 21/382  lung]
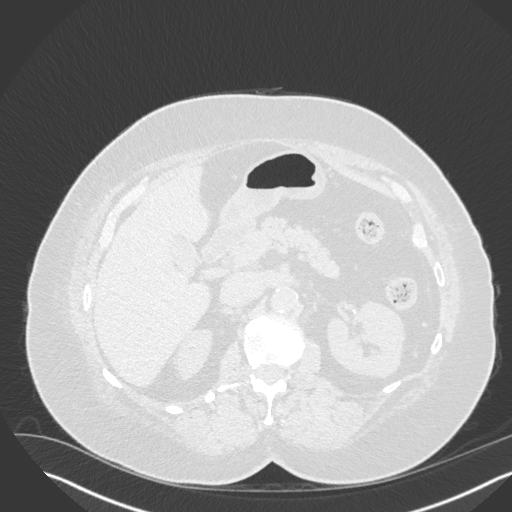
[im 61/382  lung]
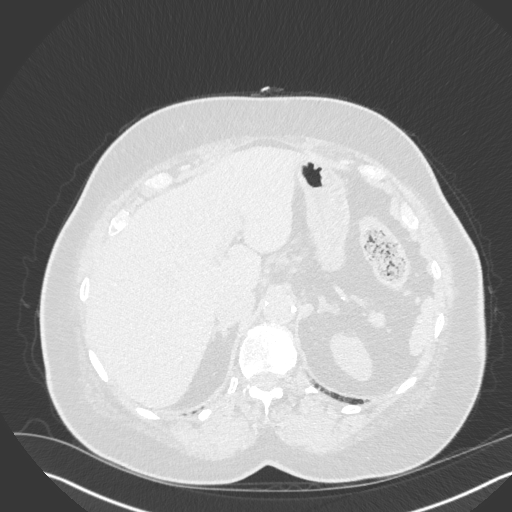
[im 81/382  lung]
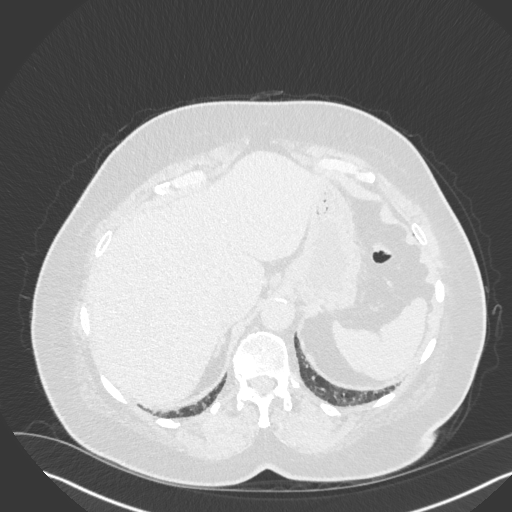
[im 121/382  lung]
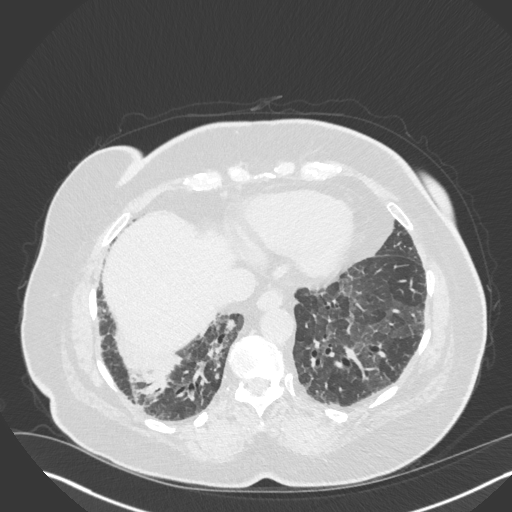
[im 141/382  mediastinal]
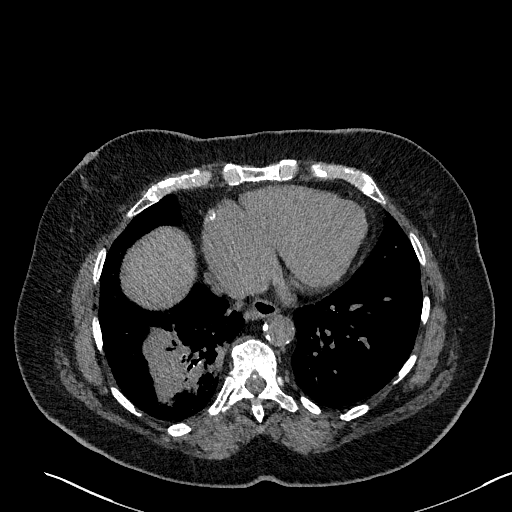
[im 141/382  lung]
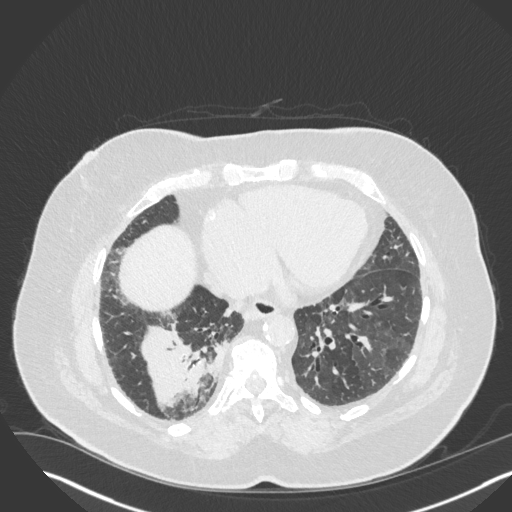
[im 181/382  lung]
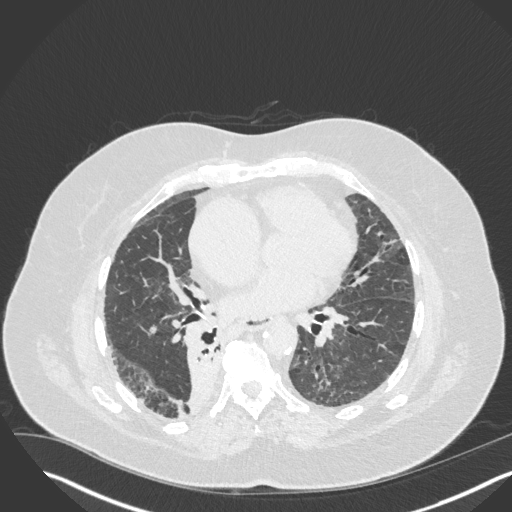
[im 201/382  lung]
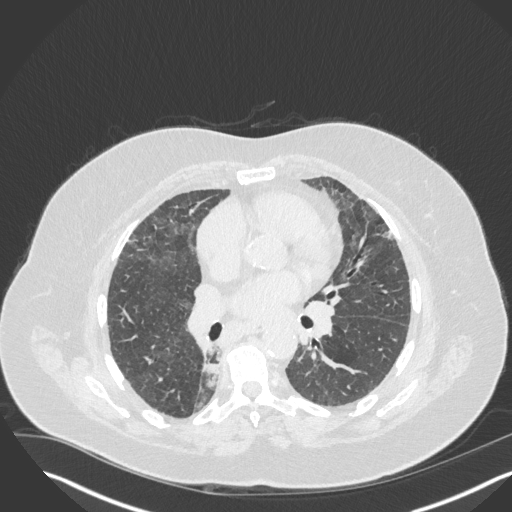
[im 241/382  lung]
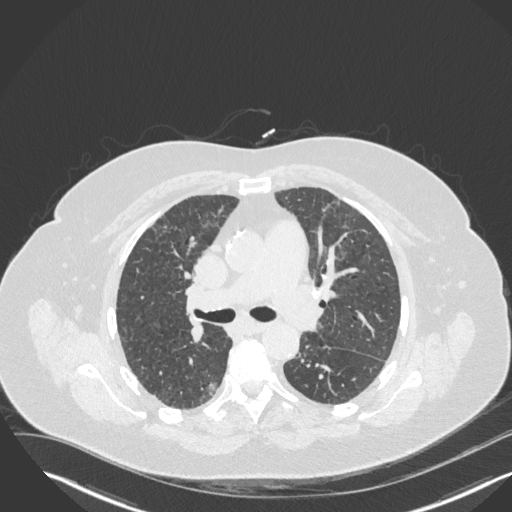
[im 261/382  mediastinal]
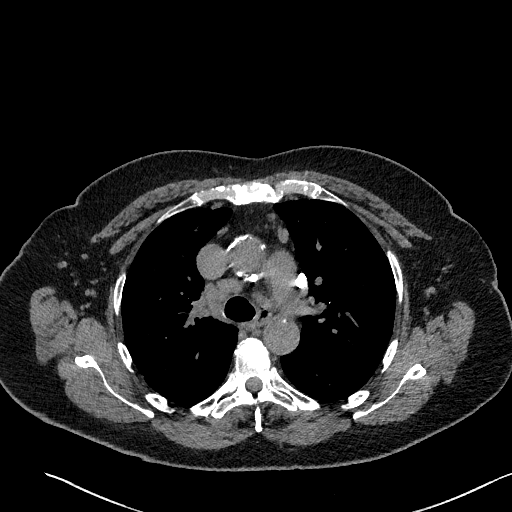
[im 261/382  lung]
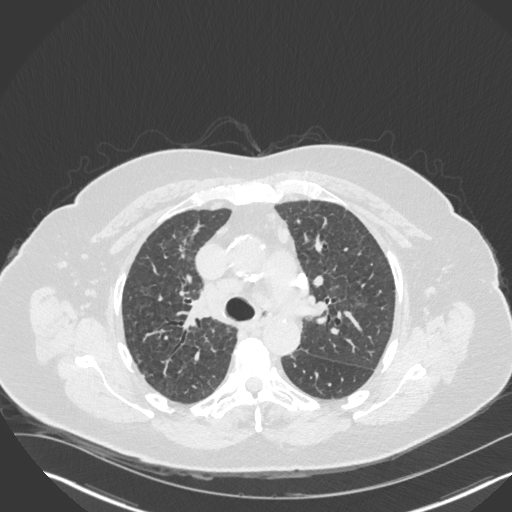
[im 301/382  lung]
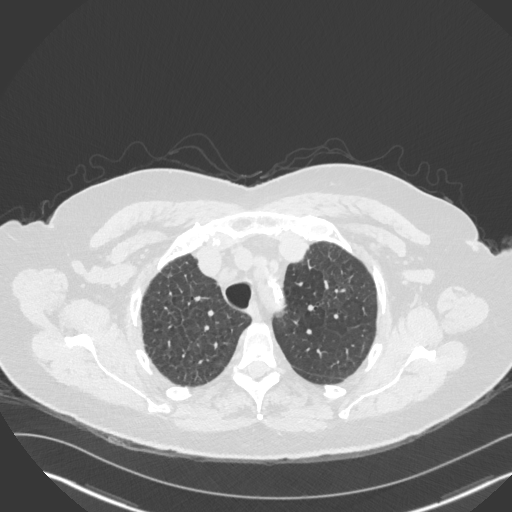
[im 321/382  lung]
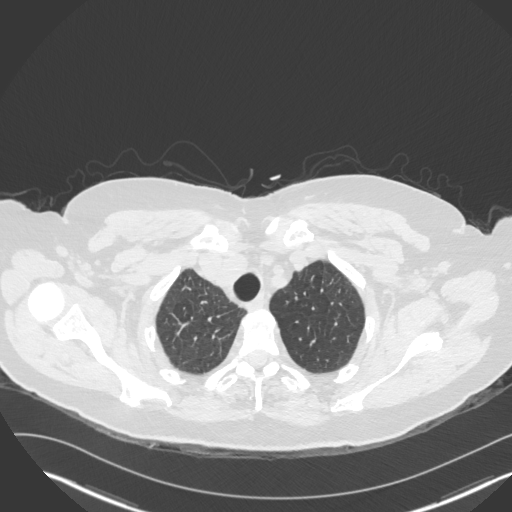
[im 361/382  lung]
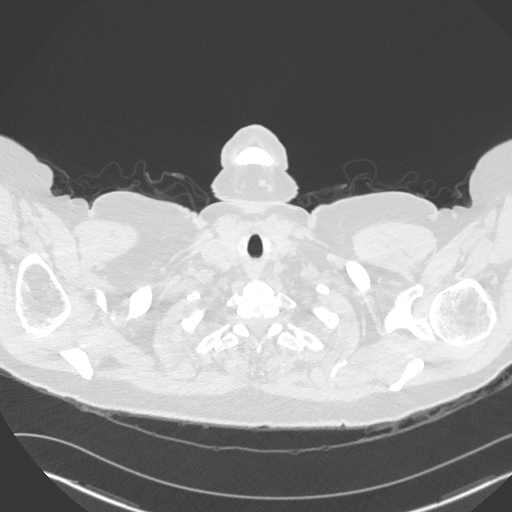

[Series 5: coronal · coronal · 0.60mm/px · 3 of 82 slices shown]
[im 17/82  lung]
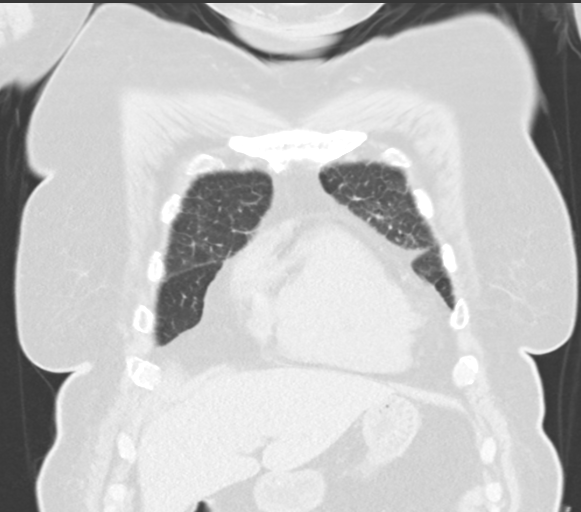
[im 33/82  lung]
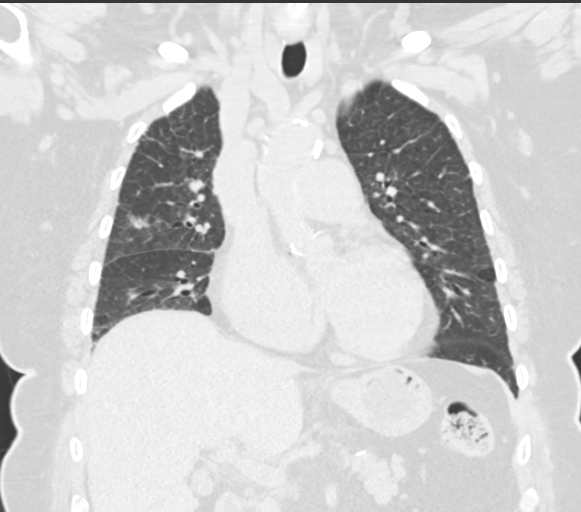
[im 49/82  lung]
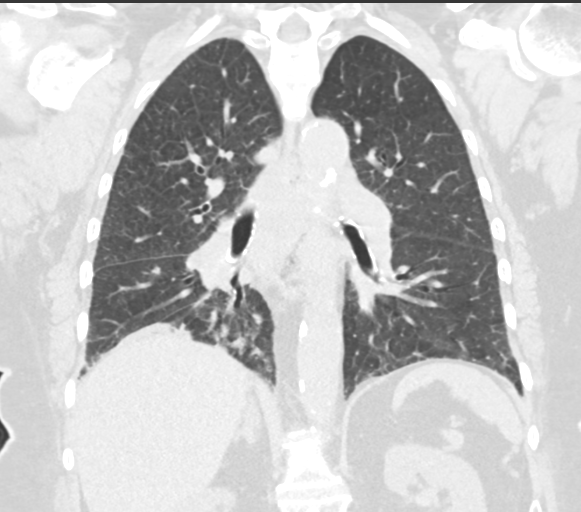

[15 of 36 positions shown; findings below may reference images not displayed]

FINDINGS: Cardiovascular: Normal heart size. Aortic atherosclerosis.
Calcification in the LAD coronary artery noted. No pericardial
effusion.

Mediastinum/Nodes: Normal appearance of the thyroid gland. The
trachea appears patent and is midline. Normal appearance of the
esophagus.. Mediastinal and hilar adenopathy is again noted. Right
paratracheal node measures 1.3 cm, image 50/2. The index subcarinal
lymph node measures 1.7 cm, image 70/2. Partially calcified
left-sided pre-vascular lymph node measures 1 cm, image 46/2. No
axillary or supraclavicular adenopathy.

Lungs/Pleura: No pleural effusion. Atelectasis and masslike
architectural distortion involving the right lower lobe is again
identified. Unchanged appearance of right lower lobe mass with
surrounding atelectasis measuring 5.6 x 5.6 cm, image 92/3.
Spiculated nodule within the right upper lobe is unchanged measuring
1.1 cm, image 66/3. Posterior medial right upper lobe subpleural
nodule is stable measuring 1.5 cm. Solid subpleural nodule in the
left upper lobe is unchanged at 7 mm. 7 mm nodule in the left apex
noted, image [DATE]. Previously measured at 1 cm.

Upper Abdomen: No acute abnormality identified within the upper
abdomen.

Musculoskeletal: No chest wall mass or suspicious bone lesions
identified.
IMPRESSION: 1. Similar size of area of masslike architectural distortion with
surrounding atelectasis and volume loss involving the right lower
lobe. Additional sub solid and ground-glass nodules appears similar
to previous exam with the exception of the left apical nodule which
is slightly smaller at 7 mm.
2. No significant change in the appearance of enlarged mediastinal
nodes.
3. Aortic Atherosclerosis ([id number]). Lad coronary artery
calcifications noted.

## 2019-09-24 IMAGING — DX DG CHEST 1V PORT
1 series · 1 of 1 positions shown · non-contrast
Comparison: Chest CT 02/05/2018 and radiograph 01/21/2018

CLINICAL DATA: S/p bronchoscopy with biopsy.

EXAM:
PORTABLE CHEST 1 VIEW

[chest ap]
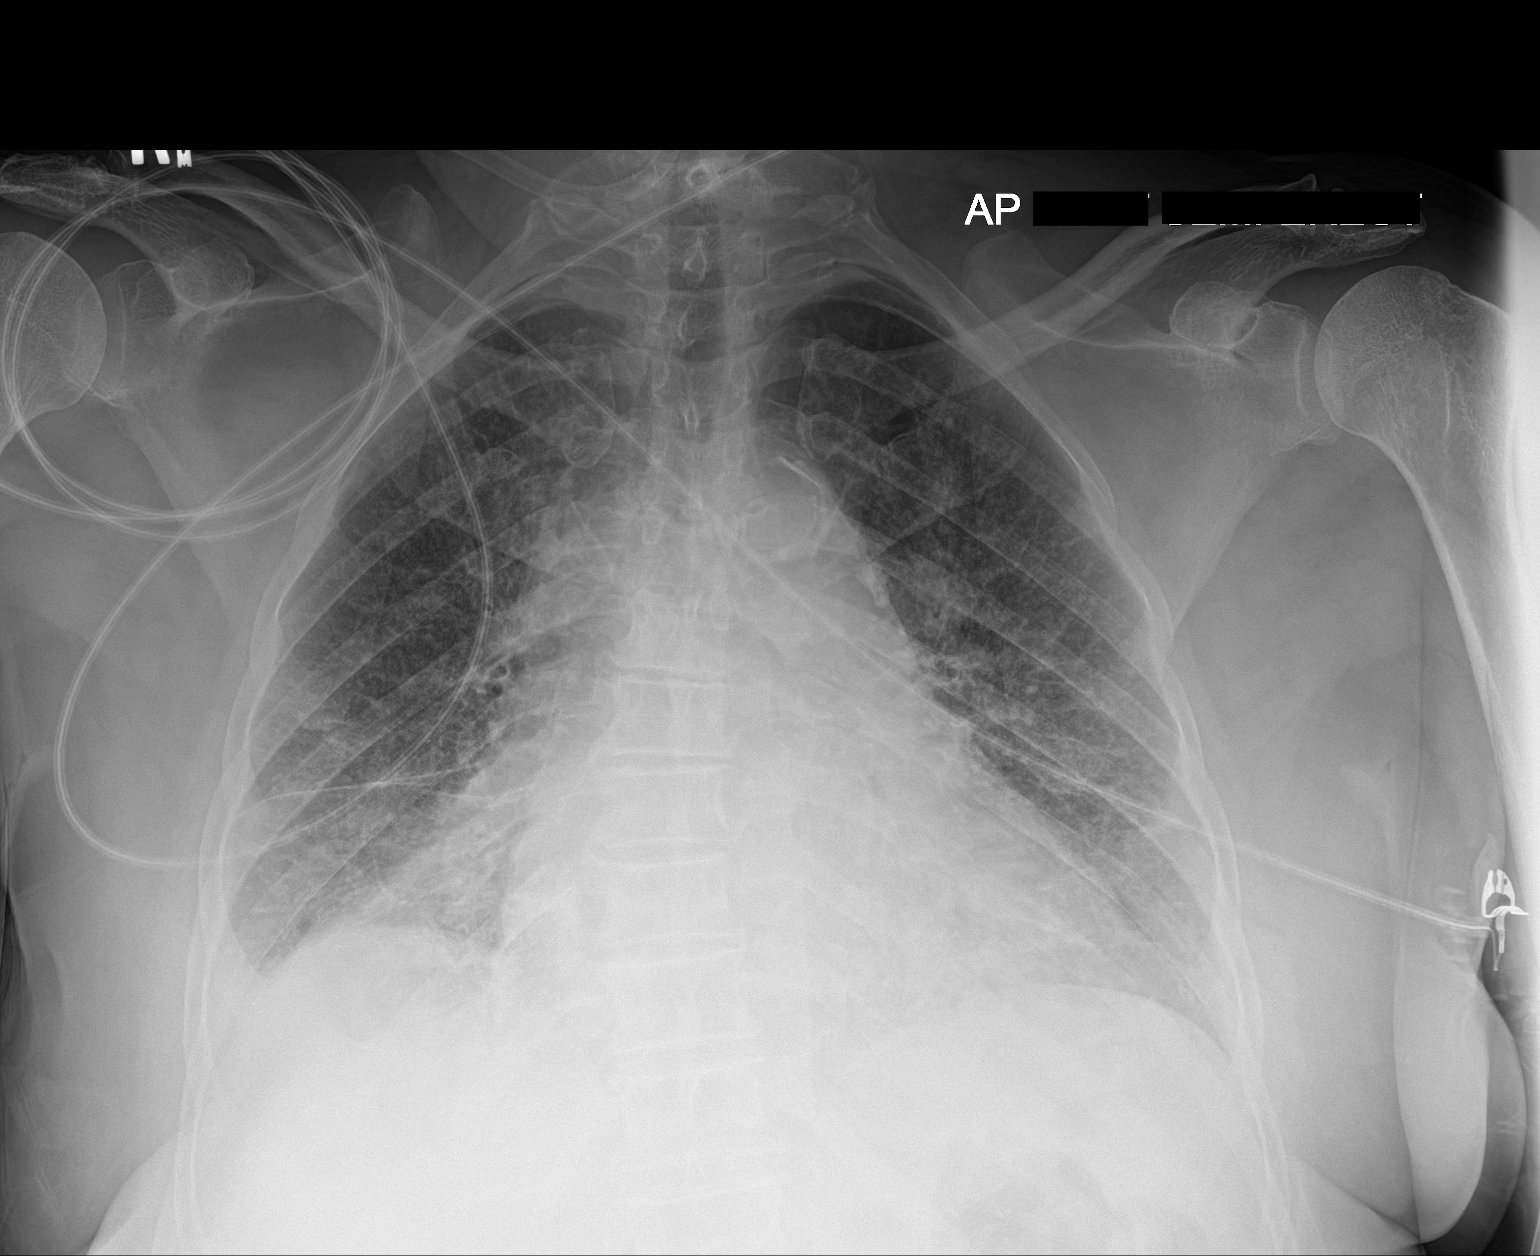

[1 of 1 positions shown; findings below may reference images not displayed]

FINDINGS: The cardiac silhouette remains enlarged. Aortic atherosclerosis is
noted. Asymmetric right lower lobe opacity and volume loss are
similar to the prior radiograph. Mild left basilar atelectasis is
again noted as well. There is chronic interstitial coarsening.
Chronic blunting of the right lateral costophrenic angle is
unchanged without a large pleural effusion evident. No pneumothorax
is identified.
IMPRESSION: 1. No pneumothorax identified status post bronchoscopy.
2. Unchanged right lower lobe opacity and volume loss.
3. Mild left basilar atelectasis.

## 2019-09-26 IMAGING — DX DG CHEST 2V
2 series · 2 of 2 positions shown · non-contrast
Comparison: 02/11/2018 chest radiograph, CT chest 02/05/2018

CLINICAL DATA: Shortness of breath, bronchoscopy 2 days ago,
weakness and shortness of breath since, history hypertension, CHF,
atrial fibrillation, former smoker

EXAM:
CHEST - 2 VIEW

[chest pa]
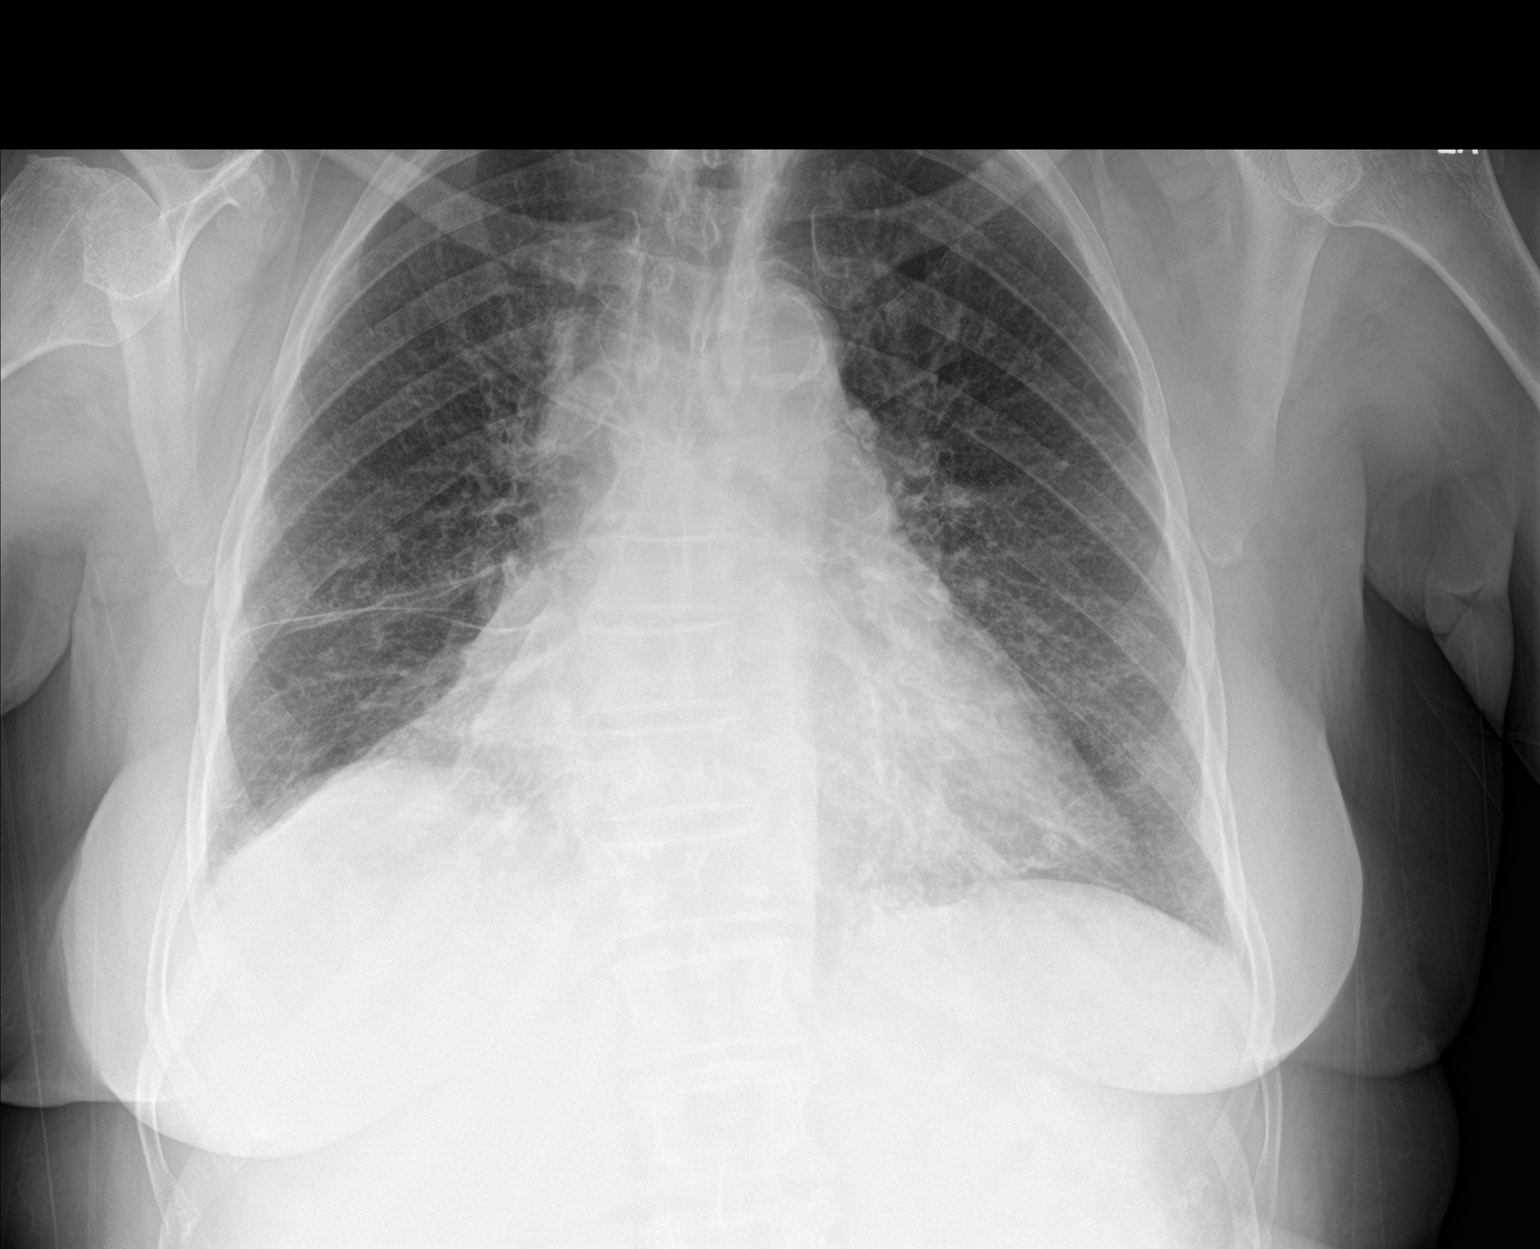

[chest lat]
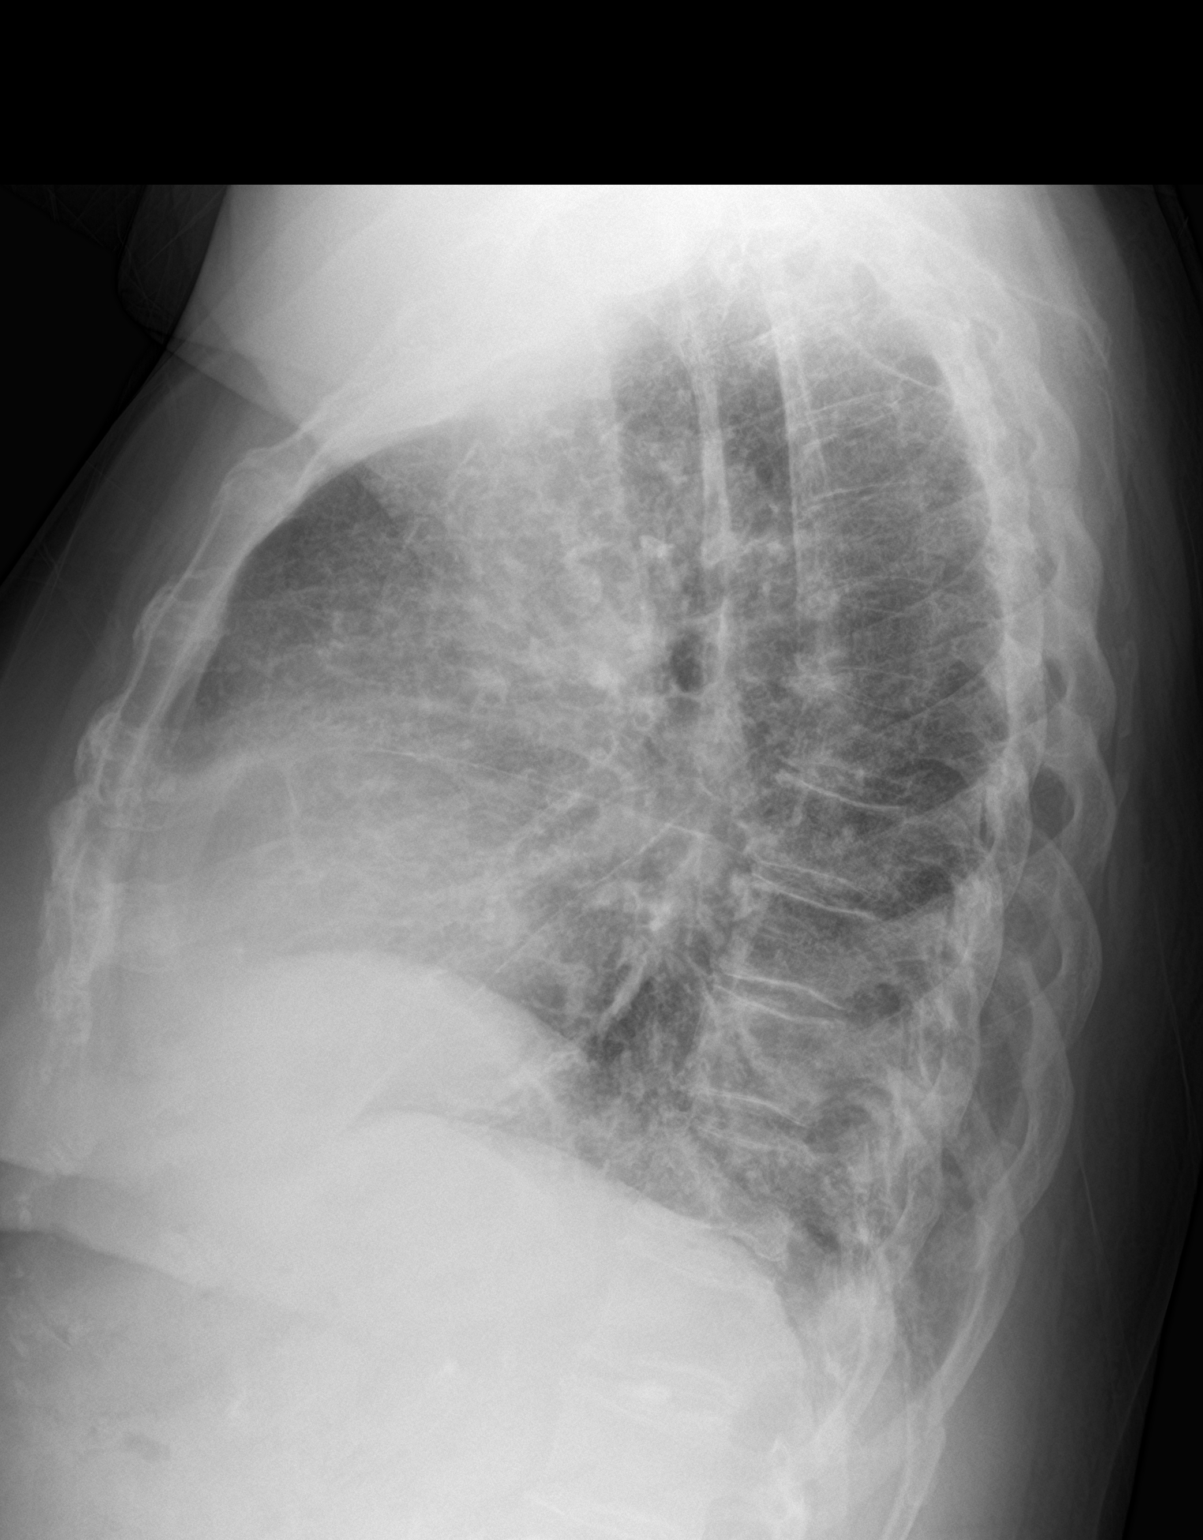

[2 of 2 positions shown; findings below may reference images not displayed]

FINDINGS: Enlargement of cardiac silhouette.

Atherosclerotic calcification aorta.

Mediastinal contours and pulmonary vascularity normal.

Chronic RIGHT lower lobe atelectasis.

Mild atelectasis versus infiltrate in LEFT lower lobe unchanged.

Diffuse interstitial prominence stable.

No pleural effusion or pneumothorax.

Bones demineralized.
IMPRESSION: Chronic bronchitic and interstitial changes with persistent RIGHT
lower lobe atelectasis.

## 2019-10-07 ENCOUNTER — Other Ambulatory Visit: Payer: Self-pay | Admitting: Internal Medicine
